# Patient Record
Sex: Male | Born: 1940 | Race: White | Hispanic: No | Smoking: Former smoker
Health system: Southern US, Community
[De-identification: ages and names within clinical notes are randomized; demographics above are authoritative.]

## PROBLEM LIST (undated history)

## (undated) ENCOUNTER — Emergency Department (HOSPITAL_COMMUNITY): Admission: EM | Payer: Self-pay | Source: Home / Self Care

## (undated) DIAGNOSIS — I10 Essential (primary) hypertension: Secondary | ICD-10-CM

## (undated) DIAGNOSIS — M1990 Unspecified osteoarthritis, unspecified site: Secondary | ICD-10-CM

## (undated) DIAGNOSIS — E7800 Pure hypercholesterolemia, unspecified: Secondary | ICD-10-CM

## (undated) DIAGNOSIS — K219 Gastro-esophageal reflux disease without esophagitis: Secondary | ICD-10-CM

## (undated) DIAGNOSIS — Z950 Presence of cardiac pacemaker: Secondary | ICD-10-CM

## (undated) DIAGNOSIS — E119 Type 2 diabetes mellitus without complications: Secondary | ICD-10-CM

## (undated) DIAGNOSIS — W19XXXA Unspecified fall, initial encounter: Secondary | ICD-10-CM

## (undated) DIAGNOSIS — Y92009 Unspecified place in unspecified non-institutional (private) residence as the place of occurrence of the external cause: Secondary | ICD-10-CM

## (undated) DIAGNOSIS — I480 Paroxysmal atrial fibrillation: Secondary | ICD-10-CM

## (undated) HISTORY — PX: FINGER AMPUTATION: SHX636

## (undated) HISTORY — PX: HEMORRHOID BANDING: SHX5850

---
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
## 2004-04-18 ENCOUNTER — Ambulatory Visit: Payer: Self-pay | Admitting: Internal Medicine

## 2004-09-19 ENCOUNTER — Ambulatory Visit: Payer: Self-pay | Admitting: Internal Medicine

## 2004-10-23 ENCOUNTER — Emergency Department (HOSPITAL_COMMUNITY): Admission: EM | Admit: 2004-10-23 | Discharge: 2004-10-23 | Payer: Self-pay | Admitting: Emergency Medicine

## 2004-11-11 ENCOUNTER — Ambulatory Visit: Payer: Self-pay | Admitting: Internal Medicine

## 2005-01-31 ENCOUNTER — Ambulatory Visit: Payer: Self-pay | Admitting: Internal Medicine

## 2005-03-07 ENCOUNTER — Ambulatory Visit: Payer: Self-pay | Admitting: Internal Medicine

## 2005-06-16 ENCOUNTER — Ambulatory Visit: Payer: Self-pay | Admitting: Internal Medicine

## 2006-06-15 ENCOUNTER — Ambulatory Visit: Payer: Self-pay | Admitting: Internal Medicine

## 2006-06-15 LAB — CONVERTED CEMR LAB
BUN: 8 mg/dL (ref 6–23)
Calcium: 9.2 mg/dL (ref 8.4–10.5)
Chloride: 103 meq/L (ref 96–112)
Creatinine, Ser: 1 mg/dL (ref 0.4–1.5)
Creatinine,U: 92.4 mg/dL
Microalb Creat Ratio: 6.5 mg/g (ref 0.0–30.0)
Microalb, Ur: 0.6 mg/dL (ref 0.0–1.9)
Potassium: 4.1 meq/L (ref 3.5–5.1)
Total CHOL/HDL Ratio: 5.7
VLDL: 36 mg/dL (ref 0–40)

## 2006-08-24 ENCOUNTER — Ambulatory Visit: Payer: Self-pay | Admitting: Internal Medicine

## 2007-02-10 ENCOUNTER — Telehealth: Payer: Self-pay | Admitting: Internal Medicine

## 2007-03-05 ENCOUNTER — Encounter: Payer: Self-pay | Admitting: Internal Medicine

## 2007-03-05 DIAGNOSIS — I10 Essential (primary) hypertension: Secondary | ICD-10-CM

## 2007-03-05 DIAGNOSIS — Z8669 Personal history of other diseases of the nervous system and sense organs: Secondary | ICD-10-CM

## 2007-03-05 DIAGNOSIS — E119 Type 2 diabetes mellitus without complications: Secondary | ICD-10-CM | POA: Insufficient documentation

## 2007-03-05 DIAGNOSIS — K219 Gastro-esophageal reflux disease without esophagitis: Secondary | ICD-10-CM | POA: Insufficient documentation

## 2007-03-05 DIAGNOSIS — R569 Unspecified convulsions: Secondary | ICD-10-CM

## 2007-03-05 DIAGNOSIS — M549 Dorsalgia, unspecified: Secondary | ICD-10-CM | POA: Insufficient documentation

## 2007-04-19 ENCOUNTER — Encounter: Payer: Self-pay | Admitting: Internal Medicine

## 2007-04-21 ENCOUNTER — Encounter: Payer: Self-pay | Admitting: Internal Medicine

## 2007-05-07 ENCOUNTER — Encounter: Payer: Self-pay | Admitting: Internal Medicine

## 2007-05-12 ENCOUNTER — Encounter: Payer: Self-pay | Admitting: Internal Medicine

## 2007-05-25 ENCOUNTER — Encounter: Payer: Self-pay | Admitting: Internal Medicine

## 2007-05-25 ENCOUNTER — Telehealth: Payer: Self-pay | Admitting: Internal Medicine

## 2007-06-01 ENCOUNTER — Telehealth: Payer: Self-pay | Admitting: Internal Medicine

## 2007-06-04 ENCOUNTER — Encounter: Payer: Self-pay | Admitting: Internal Medicine

## 2007-07-07 ENCOUNTER — Telehealth: Payer: Self-pay | Admitting: Internal Medicine

## 2007-07-27 ENCOUNTER — Encounter: Payer: Self-pay | Admitting: Internal Medicine

## 2007-08-10 ENCOUNTER — Encounter: Payer: Self-pay | Admitting: Internal Medicine

## 2007-09-01 ENCOUNTER — Encounter: Payer: Self-pay | Admitting: Internal Medicine

## 2007-09-02 ENCOUNTER — Telehealth: Payer: Self-pay | Admitting: Internal Medicine

## 2007-09-22 ENCOUNTER — Encounter: Payer: Self-pay | Admitting: Internal Medicine

## 2007-09-23 ENCOUNTER — Encounter: Payer: Self-pay | Admitting: Internal Medicine

## 2007-10-27 ENCOUNTER — Telehealth: Payer: Self-pay | Admitting: Internal Medicine

## 2007-11-03 ENCOUNTER — Telehealth: Payer: Self-pay | Admitting: Internal Medicine

## 2007-11-16 ENCOUNTER — Telehealth: Payer: Self-pay | Admitting: Internal Medicine

## 2008-02-04 ENCOUNTER — Telehealth: Payer: Self-pay | Admitting: Internal Medicine

## 2008-02-25 DIAGNOSIS — I480 Paroxysmal atrial fibrillation: Secondary | ICD-10-CM

## 2008-02-25 HISTORY — PX: OTHER SURGICAL HISTORY: SHX169

## 2008-02-25 HISTORY — DX: Paroxysmal atrial fibrillation: I48.0

## 2008-02-25 HISTORY — PX: APPENDECTOMY: SHX54

## 2008-04-26 ENCOUNTER — Telehealth: Payer: Self-pay | Admitting: Internal Medicine

## 2008-06-05 ENCOUNTER — Encounter: Payer: Self-pay | Admitting: Internal Medicine

## 2008-08-08 ENCOUNTER — Telehealth: Payer: Self-pay | Admitting: Internal Medicine

## 2008-08-09 ENCOUNTER — Encounter (INDEPENDENT_AMBULATORY_CARE_PROVIDER_SITE_OTHER): Payer: Self-pay | Admitting: *Deleted

## 2008-12-13 ENCOUNTER — Telehealth (INDEPENDENT_AMBULATORY_CARE_PROVIDER_SITE_OTHER): Payer: Self-pay | Admitting: *Deleted

## 2008-12-16 ENCOUNTER — Ambulatory Visit: Payer: Self-pay | Admitting: Internal Medicine

## 2008-12-16 ENCOUNTER — Inpatient Hospital Stay (HOSPITAL_COMMUNITY): Admission: EM | Admit: 2008-12-16 | Discharge: 2008-12-20 | Payer: Self-pay | Admitting: Emergency Medicine

## 2008-12-18 ENCOUNTER — Encounter (INDEPENDENT_AMBULATORY_CARE_PROVIDER_SITE_OTHER): Payer: Self-pay | Admitting: General Surgery

## 2008-12-27 ENCOUNTER — Ambulatory Visit (HOSPITAL_COMMUNITY): Admission: RE | Admit: 2008-12-27 | Discharge: 2008-12-27 | Payer: Self-pay | Admitting: General Surgery

## 2010-02-05 ENCOUNTER — Encounter
Admission: RE | Admit: 2010-02-05 | Discharge: 2010-03-26 | Payer: Self-pay | Source: Home / Self Care | Attending: Physical Medicine & Rehabilitation | Admitting: Physical Medicine & Rehabilitation

## 2010-02-11 ENCOUNTER — Ambulatory Visit: Payer: Self-pay | Admitting: Physical Medicine & Rehabilitation

## 2010-05-30 LAB — CBC
HCT: 37.7 % — ABNORMAL LOW (ref 39.0–52.0)
Hemoglobin: 14.8 g/dL (ref 13.0–17.0)
MCHC: 34 g/dL (ref 30.0–36.0)
MCHC: 34.5 g/dL (ref 30.0–36.0)
MCV: 92.7 fL (ref 78.0–100.0)
Platelets: 257 10*3/uL (ref 150–400)
Platelets: 289 10*3/uL (ref 150–400)
Platelets: 330 10*3/uL (ref 150–400)
RBC: 4.67 MIL/uL (ref 4.22–5.81)
RDW: 14.2 % (ref 11.5–15.5)
RDW: 14.3 % (ref 11.5–15.5)
WBC: 21.4 10*3/uL — ABNORMAL HIGH (ref 4.0–10.5)

## 2010-05-30 LAB — URINALYSIS, ROUTINE W REFLEX MICROSCOPIC
Glucose, UA: 500 mg/dL — AB
Hgb urine dipstick: NEGATIVE
Nitrite: NEGATIVE
Protein, ur: 30 mg/dL — AB
Urobilinogen, UA: 1 mg/dL (ref 0.0–1.0)
pH: 5 (ref 5.0–8.0)

## 2010-05-30 LAB — GLUCOSE, CAPILLARY
Glucose-Capillary: 104 mg/dL — ABNORMAL HIGH (ref 70–99)
Glucose-Capillary: 107 mg/dL — ABNORMAL HIGH (ref 70–99)
Glucose-Capillary: 110 mg/dL — ABNORMAL HIGH (ref 70–99)
Glucose-Capillary: 128 mg/dL — ABNORMAL HIGH (ref 70–99)
Glucose-Capillary: 130 mg/dL — ABNORMAL HIGH (ref 70–99)
Glucose-Capillary: 133 mg/dL — ABNORMAL HIGH (ref 70–99)
Glucose-Capillary: 137 mg/dL — ABNORMAL HIGH (ref 70–99)
Glucose-Capillary: 144 mg/dL — ABNORMAL HIGH (ref 70–99)
Glucose-Capillary: 155 mg/dL — ABNORMAL HIGH (ref 70–99)
Glucose-Capillary: 160 mg/dL — ABNORMAL HIGH (ref 70–99)
Glucose-Capillary: 99 mg/dL (ref 70–99)

## 2010-05-30 LAB — BASIC METABOLIC PANEL
BUN: 10 mg/dL (ref 6–23)
BUN: 17 mg/dL (ref 6–23)
BUN: 28 mg/dL — ABNORMAL HIGH (ref 6–23)
BUN: 36 mg/dL — ABNORMAL HIGH (ref 6–23)
CO2: 24 mEq/L (ref 19–32)
CO2: 28 mEq/L (ref 19–32)
Calcium: 7.5 mg/dL — ABNORMAL LOW (ref 8.4–10.5)
Calcium: 7.8 mg/dL — ABNORMAL LOW (ref 8.4–10.5)
Chloride: 101 mEq/L (ref 96–112)
Chloride: 102 mEq/L (ref 96–112)
GFR calc non Af Amer: 37 mL/min — ABNORMAL LOW (ref >60)
GFR calc non Af Amer: 39 mL/min — ABNORMAL LOW (ref >60)
GFR calc non Af Amer: 58 mL/min — ABNORMAL LOW (ref >60)
GFR calc non Af Amer: >60 mL/min (ref >60)
Glucose, Bld: 106 mg/dL — ABNORMAL HIGH (ref 70–99)
Glucose, Bld: 112 mg/dL — ABNORMAL HIGH (ref 70–99)
Glucose, Bld: 153 mg/dL — ABNORMAL HIGH (ref 70–99)
Potassium: 3.9 mEq/L (ref 3.5–5.1)
Potassium: 5.4 mEq/L — ABNORMAL HIGH (ref 3.5–5.1)

## 2010-05-30 LAB — COMPREHENSIVE METABOLIC PANEL
AST: 22 U/L (ref 0–37)
Albumin: 2.7 g/dL — ABNORMAL LOW (ref 3.5–5.2)
Alkaline Phosphatase: 81 U/L (ref 39–117)
Calcium: 8.5 mg/dL (ref 8.4–10.5)
Chloride: 98 mEq/L (ref 96–112)
GFR calc Af Amer: 40 mL/min — ABNORMAL LOW (ref >60)
GFR calc non Af Amer: 33 mL/min — ABNORMAL LOW (ref >60)
Glucose, Bld: 227 mg/dL — ABNORMAL HIGH (ref 70–99)
Total Bilirubin: 0.9 mg/dL (ref 0.3–1.2)

## 2010-05-30 LAB — DIFFERENTIAL
Basophils Absolute: 0 10*3/uL (ref 0.0–0.1)
Basophils Relative: 0 % (ref 0–1)
Eosinophils Absolute: 0 10*3/uL (ref 0.0–0.7)
Eosinophils Relative: 0 % (ref 0–5)
Eosinophils Relative: 0 % (ref 0–5)
Lymphocytes Relative: 2 % — ABNORMAL LOW (ref 12–46)
Lymphocytes Relative: 6 % — ABNORMAL LOW (ref 12–46)
Monocytes Absolute: 0.6 10*3/uL (ref 0.1–1.0)
Neutrophils Relative %: 91 % — ABNORMAL HIGH (ref 43–77)

## 2010-05-30 LAB — HEMOGLOBIN A1C: Mean Plasma Glucose: 166 mg/dL

## 2010-05-30 LAB — PROTIME-INR: INR: 1.07 (ref 0.00–1.49)

## 2010-05-30 LAB — CULTURE, ROUTINE-ABSCESS

## 2010-05-30 LAB — CARDIAC PANEL(CRET KIN+CKTOT+MB+TROPI): Troponin I: 0.03 ng/mL (ref 0.00–0.06)

## 2010-05-30 LAB — URINE MICROSCOPIC-ADD ON

## 2010-07-09 NOTE — Assessment & Plan Note (Signed)
Darin Gonzalez                           PRIMARY CARE OFFICE NOTE   Darin Gonzalez                       MRN:          [id number]  DATE:08/24/2006                            DOB:          1940-05-06    Darin Gonzalez is a 70 year old Caucasian gentleman who is followed for  diabetes and hypertension who presents today for followup evaluation and  exam.  Patient has, over the past several months, requested several  prescriptions for Vicodin.  Most recently saw Dr. Efrain Gonzalez June 15, 2006, who at that time provided a prescription for Vicodin ES number 120  to take 4 times daily.  In talking with the patient, he reports the  Vicodin is to help treat the back and knee pain.  The prescription  evidently was initiated by Dr. Cindee Gonzalez.  I have contacted Dr. Wilmer Gonzalez  office, and they had no x-ray studies on this gentleman, who has  prescribed Vicodin for him in March.   Patient has been followed for diabetes.  We have had a flurry of  exchanges with multiple diabetic supply firms with the patient  requesting supplies for monitoring t.i.d.  He has been well controlled,  and this has been declined.   PAST MEDICAL HISTORY:  Surgical:  1. Tonsillectomy, remote.  2. Vasectomy, remote.  3. 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   Medical illnesses:  1. Usual childhood diseases.  2. Seizure activity as a younger man, now resolved.  3. Hypertension.  4. GERD.  5. Diabetes.  6. Back and knee pain.   HABITS:  Tobacco none.  Alcohol none.   DRUG ALLERGIES:  PATIENT REPORTS CODEINE CAUSES HIM TO BE CRAZY, YET HE  IS TOLERATING HYDRO CONE.  SULFA CAUSES HIM TO BE CRAZY.   FAMILY HISTORY:  Both parents died of MI's.  One brother with CAD.   SOCIAL HISTORY:  Patient is in his 5th marriage with his 4th wife.  He  has 1 son and 1 daughter, 3 grandchildren.  Currently unemployed.   REVIEW OF SYSTEMS:  Negative for any  fever, sweats, or chills.  He has  had no visual changes, and has an eye exam scheduled for August 31, 2006.  No cardiovascular, respiratory, GI, or GU problems.   CURRENT MEDICATIONS:  1. Lotrel 5/20 once daily.  2. Metformin 1000 mg b.i.d.  3. Protonix 40 mg q.a.m.  4. Nasacort nasal spray daily.  5. Sertraline 50 mg daily.  6. Vicodin 7.5/750 q.i.d.  7. Viagra on a p.r.n. basis.   EXAMINATION:  Temperature was 98.6.  Blood pressure 137/80.  Pulse 91.  Weight 277.  GENERAL APPEARANCE:  This is an overweight Caucasian male in no acute  distress.  HEENT EXAM:  Normocephalic and atraumatic.  Conjunctivae and sclerae  were clear.  NECK:  Supple without thyromegaly.  NODES:  No adenopathy was noted in the cervical, supraclavicular  regions.  CHEST:  No CVA tenderness or deformities noted.  LUNGS:  Clear with no rales, wheezes, or rhonchi.  CARDIOVASCULAR:  Two plus radial  pulses.  No JVD.  No carotid bruits.  He had a quiet precordium with a regular rate and rhythm without  murmurs, rubs, or gallops.  ABDOMEN:  Obese.  No guarding or rebound was noted.  No  organosplenomegaly was noted.  EXTREMITIES:  Patient has no deformities noted.  No effusions of the  knee.  MUSCULOSKELETAL EXAM:  Patient is able to move about the exam room  without difficulty.  He is able to stand without assistance.  He can do  a step-up without assistance.  DERM:  Clear.   ASSESSMENT AND PLAN:  1. Hypertension.  Patient's blood pressure is adequately controlled.      He will continue on his present medications.  2. Diabetes.  Patient's last laboratory work dates from June 15, 2006, at which time he had a serum glucose of 146, and an A1c of      6.6%.  Plan:  Patient is to continue on his present medical      regimen.  3. Gastroesophageal reflux disease.  Patient is stable on his present      medicine, and will continue with same.  4. Lipids.  Patient's cholesterol is 213 with an HDL of 37.5, LDL  of      161.  Patient is diabetic, and the goal would be for him to have an      LDL cholesterol of less than 100.  Plan:  Lovastatin 20 mg 2 q.a.m.      with repeat laboratory in 4 to 6 weeks to monitor his response to      medication.  5. Chronic pain.  Patient reports he has ongoing back pain and knee      pain.  There is no documentation of the degree of his      osteoarthritis.  Plan:  Patient is to have bilateral knee films and      LS spine films, and he is to return for those at his convenience.  6. Pain management.  Patient evidently with chronic pain, which he      reports he has adequate control with Vicodin 7.5/750 four times      daily.  Discussed with him pain management, and the goals of pain      management including the concept of control and not being pain      free.  Went over the risks and benefits of pain management with      narcotics including somnolence, higher risk for accidents, risks      for constipation.  Patient also understands the potential for      addiction with hydrocodone.  The rules of prescribing were      discussed, including the use of a single prescribe for all pain      related medications, use of single pharmacy.  Patient is not to      call after hours for refills, not to call on weekends for refills,      or seek refills from other members of my group.  He understands      that violating any of these precepts would result in cessation of      narcotic prescribing.  Plan:  Patient is given refill prescriptions      for Vicodin 7.5/750 for the next 3 months.  He is to contact his      pharmacy supplier to cancel out his standing prescription on file      for September 25, 2006.  Patient is started on nabumetone 1000 mg      nightly.  Patient is continue these medications.  He is to follow      up on a routine basis.  He is subject to random drug screens. 7. Health maintenance.  Patient's laboratory work is unremarkable      except for the lipids as  noted above.  He would be a candidate for      colorectal cancer screening, which can be arranged at his      convenience.  Last chest x-ray was from Jun 29, 2003, and showed no      active disease, and no indication for repeat at this time.     Rosalyn Gess Norins, MD  Electronically Signed    MEN/MedQ  DD: 08/25/2006  DT: 08/25/2006  Job #: [id number]   cc:   Darleene Cleaver

## 2011-04-10 ENCOUNTER — Telehealth: Payer: Self-pay

## 2011-04-10 NOTE — Telephone Encounter (Signed)
[employer] CALLING TO CONFIRM WE RECEIVED FORM FOR DIABETIC TESTING SUPPLIES. SUPPLIES DENIED BY INSURANCE AND THEY NEED FORM FILLED OUT TO [employer].  BEST: [phone]

## 2011-04-10 NOTE — Telephone Encounter (Signed)
.  [facility] PT STATES THE MEDICINE HE TAKES FOR DEPRESSION HAVE STOPPED WORKING. NEED TO HAVE SOMETHING ELSE PLEASE CALL [phone]   [facility] ON [address]

## 2011-04-11 NOTE — Telephone Encounter (Signed)
Pt last seen August 2012.  He will need to RTC for medication change or adjustment.

## 2011-04-11 NOTE — Telephone Encounter (Signed)
PT SHOWED UP AT [facility] EXPECTING HIS MEDS TO BE READY PT INFORMED THROUGH PHARMACY WE NEED TO GET IT APPROVED BY A PROVIDER  DEPRESSION MEDICATION

## 2011-04-11 NOTE — Telephone Encounter (Signed)
Spoke with [employer], they state they believe they have received fax but will give [place] a CB if they did not. Gave our fax numbers if they needed to refax forms.

## 2011-04-11 NOTE — Telephone Encounter (Signed)
Spoke with patient says klonazepam 2mg  is not helping him sleep at all. Pt states has taken 5mg  of this medication before and says it helped him out? Patient's chart at desk.

## 2011-04-11 NOTE — Telephone Encounter (Signed)
Need chart

## 2011-04-12 NOTE — Telephone Encounter (Signed)
Spoke with pat and advised him to RTC for eval to adjust meds. Pt understood and will come in to see Dr L.

## 2011-04-24 ENCOUNTER — Ambulatory Visit: Payer: Self-pay | Admitting: Family Medicine

## 2011-05-02 ENCOUNTER — Telehealth: Payer: Self-pay

## 2011-05-02 ENCOUNTER — Ambulatory Visit (INDEPENDENT_AMBULATORY_CARE_PROVIDER_SITE_OTHER): Payer: Medicare Other | Admitting: Family Medicine

## 2011-05-02 VITALS — BP 123/71 | HR 90 | Temp 97.5°F | Resp 24 | Ht 66.0 in | Wt 267.2 lb

## 2011-05-02 DIAGNOSIS — I10 Essential (primary) hypertension: Secondary | ICD-10-CM | POA: Diagnosis not present

## 2011-05-02 MED ORDER — ESOMEPRAZOLE MAGNESIUM 40 MG PO CPDR: 40.0000 mg | DELAYED_RELEASE_CAPSULE | Freq: Every day | ORAL | Status: DC

## 2011-05-02 MED ORDER — PRAVASTATIN SODIUM 40 MG PO TABS: 40.0000 mg | ORAL_TABLET | Freq: Every day | ORAL | Status: DC

## 2011-05-02 MED ORDER — LORAZEPAM 1 MG PO TABS: 2.0000 mg | ORAL_TABLET | Freq: Two times a day (BID) | ORAL | Status: DC | PRN

## 2011-05-02 MED ORDER — ALBUTEROL SULFATE HFA 108 (90 BASE) MCG/ACT IN AERS: 2.0000 | INHALATION_SPRAY | Freq: Four times a day (QID) | RESPIRATORY_TRACT | Status: DC | PRN

## 2011-05-02 MED ORDER — POLYETHYLENE GLYCOL 3350 17 GM/SCOOP PO POWD: 17.0000 g | Freq: Two times a day (BID) | ORAL | Status: AC | PRN

## 2011-05-02 MED ORDER — AMLODIPINE BESY-BENAZEPRIL HCL 5-20 MG PO CAPS: 1.0000 | ORAL_CAPSULE | Freq: Every day | ORAL | Status: DC

## 2011-05-02 MED ORDER — METFORMIN HCL 1000 MG PO TABS: ORAL_TABLET | ORAL | Status: DC

## 2011-05-02 NOTE — Progress Notes (Signed)
71 yo man seen with son today.  He has been doing well, losing weight and generally feeling well except for some constipation, which he attributes to Pravastatin.  O:  NAD, labs and hx reviewed with patient.  Prescriptions rewritten.  P:  Switch from clonazepam to lorazepam, add miralax.  Otherwise no changes. Recheck full labs in 6 months.

## 2011-05-05 ENCOUNTER — Telehealth: Payer: Self-pay

## 2011-05-05 NOTE — Telephone Encounter (Signed)
.  [facility] PT STATES THE PHARMACY HE USUALLY GETS HIS MEDICINE FROM ISN'T ABLE TO GET HIM ALL THAT HE NEEDS WOULD LIKE [place] TO CALL IN 5 MORE REFILLS ON HIS LORAZEPAM, ALREADY HAVE ONE REFILL   [facility] ON [address]

## 2011-05-06 ENCOUNTER — Telehealth: Payer: Self-pay

## 2011-05-06 NOTE — Telephone Encounter (Signed)
Patient had 180 with 1 rf sent into [facility] on 3/8...  How many was he able to get?  When can they send remainder?  Get info and then we can ask Dr. Elbert Ewings...  LMOM to CB.

## 2011-05-06 NOTE — Telephone Encounter (Signed)
Pt stated that mail order pharmacy does not do controlled meds anymore and needed rx for lorazepam called into local pharmacy.  rx phoned in to local pharmacy

## 2011-05-06 NOTE — Telephone Encounter (Signed)
Pt called back advised RX sent to his pharmacy

## 2011-05-08 ENCOUNTER — Ambulatory Visit: Payer: Self-pay | Admitting: Family Medicine

## 2011-06-03 ENCOUNTER — Telehealth: Payer: Self-pay

## 2011-06-05 NOTE — Telephone Encounter (Signed)
Chart not filed back, was told Dr. Elbert Ewings has chart at appt center.  Dr. Marylu Lund you rx, or let us know the type of meter patient uses so we can rx?  Thank you!

## 2011-06-05 NOTE — Telephone Encounter (Signed)
PT STATES HE WAS SUPPOSE TO BE GETTING SOME TEST SCRIPTS AND HE TEST 4 TIMES A DAY PLEASE CALL [phone]      [facility] ON [address]

## 2011-06-05 NOTE — Telephone Encounter (Signed)
Spoke w/pt and explained that Dr L said it was not [place] for him to test more than 1 x day and therefore, he cannot Rx for 3 x day. Pt verb understanding and said that [employer] had told him this morn that they will send him out the supplies for 1 x day. He will CB if he has any trouble getting these.

## 2011-06-06 ENCOUNTER — Telehealth: Payer: Self-pay

## 2011-06-06 NOTE — Telephone Encounter (Signed)
Pt states he is out of his test strips and he will definitely need them tomorrow

## 2011-06-06 NOTE — Telephone Encounter (Signed)
Pt calling and would like Dr L to call [employer] [facility] to explain to them that pt test 3x a day, because they need to know which qaunity to send he normally gets 300 strips 3x a day.Rosann Auerbach rx customer serv [phone] pharmacy help desk [phone] pt Id #[id number]

## 2011-06-09 NOTE — Telephone Encounter (Signed)
Please call [employer] customer service for this.

## 2011-06-09 NOTE — Telephone Encounter (Signed)
Spoke w/[employer] and found out a new form must be completed for Medicare to cover the QD testing. He will send a new form to be completed and faxed in.

## 2011-06-13 NOTE — Telephone Encounter (Signed)
Gardiner Rhyme from [employer] they are needing the code for pt supply they faxed info on the 12th this is or still should be in Dr Armond Hang box, please fax info if any questions contact customer service [phone]

## 2011-06-19 ENCOUNTER — Ambulatory Visit: Payer: Medicare Other | Admitting: Family Medicine

## 2011-06-19 VITALS — BP 95/63 | HR 112 | Temp 98.2°F | Resp 18 | Ht 66.0 in | Wt 261.4 lb

## 2011-06-19 DIAGNOSIS — L2989 Other pruritus: Secondary | ICD-10-CM

## 2011-06-19 DIAGNOSIS — L298 Other pruritus: Secondary | ICD-10-CM

## 2011-06-19 DIAGNOSIS — L239 Allergic contact dermatitis, unspecified cause: Secondary | ICD-10-CM

## 2011-06-19 DIAGNOSIS — IMO0001 Reserved for inherently not codable concepts without codable children: Secondary | ICD-10-CM

## 2011-06-19 MED ORDER — TRIAMCINOLONE ACETONIDE 0.1 % EX CREA: TOPICAL_CREAM | Freq: Two times a day (BID) | CUTANEOUS | Status: DC

## 2011-06-19 NOTE — Patient Instructions (Signed)

## 2011-06-19 NOTE — Progress Notes (Signed)
Subjective: 71 year old man whose grandson stayed with him  last Friday. His grandson was itching quite a lot. After he left the patient noticed that he started itching, and developed a rash on his low back medial thighs and groin region. He knows of no contact with anything that he would've been allergic to. No new close ourselves.  Objective: Erythematous maculopapular rash in the small of the back, a little in the groin, and upper medial thigh regions.  KOH skin scraping was done and was negative.  Assessment: Neuritis Allergic dermatitis Diabetes, not well controlled  Plan: Triamcinolone cream twice a day. If he is not doing better he is to get back to [place]. Thank you

## 2011-06-20 NOTE — Telephone Encounter (Signed)
Can you please advise on this.  How many times a day would you like for him to test?

## 2011-06-20 NOTE — Telephone Encounter (Signed)
Drs.,  Please advise the clinical message pool how many times a day this patient was instructed to check his blood sugars.  His most recent prescription for strips only gave him #100, instead of #300.

## 2011-06-20 NOTE — Telephone Encounter (Signed)
PT STATES HE ONLY RECEIVED 2 BOXES OF HIS SCRIPTS AND HE USUALLY GET 6 BOXES AND DOESN'T UNDERSTAND WHY. HE NEED TO TEST MORE SINCE HIS SUGAR IS HIGH PLEASE CALL [phone]

## 2011-06-21 ENCOUNTER — Other Ambulatory Visit: Payer: Self-pay | Admitting: Family Medicine

## 2011-06-22 ENCOUNTER — Other Ambulatory Visit: Payer: Self-pay | Admitting: Family Medicine

## 2011-06-22 NOTE — Telephone Encounter (Signed)
Patient only needs to check his blood sugar once a day, varying times of day when test is done: Fasting three times a week, 2 hours pc twice a week, and evenings twice a week.

## 2011-06-23 NOTE — Telephone Encounter (Signed)
LMOM to call back

## 2011-06-23 NOTE — Telephone Encounter (Signed)
Spoke with patient and let him know what dr. l said to do.  Patient stated that he understood.

## 2011-06-26 ENCOUNTER — Telehealth: Payer: Self-pay

## 2011-06-26 NOTE — Telephone Encounter (Signed)
.  [facility] Darin Gonzalez from [employer] called to request progress notes for Medicare review.  Darin Gonzalez stated that she sent request on 06/04/11 but has not had response.  Please call Darin Gonzalez at [phone]. Fax number to send records to is [phone].

## 2011-06-27 ENCOUNTER — Telehealth: Payer: Self-pay

## 2011-06-27 NOTE — Telephone Encounter (Signed)
Please fax all recent (last 6 months) progress notes, labs to [place] at [phone] for [facility] certification.

## 2011-06-27 NOTE — Telephone Encounter (Signed)
PT STATES THE TUBE OF MEDICINE HE WAS GIVEN FOR ITCHING HAVE ALL GONE AND IT DIDN'T DO ANYTHING ANYWAY. DIDN'T HELP AT ALL PLEASE CALL PT AT [phone]

## 2011-06-29 MED ORDER — HYDROXYZINE HCL 25 MG PO TABS: 25.0000 mg | ORAL_TABLET | Freq: Every evening | ORAL | Status: AC | PRN

## 2011-06-29 NOTE — Telephone Encounter (Signed)
Please advise 

## 2011-06-29 NOTE — Telephone Encounter (Signed)
Called patient. Left message on machine with instructions.

## 2011-06-29 NOTE — Telephone Encounter (Signed)
Take OTC Claritin QAM, Atarax (RX sent) at HS, both as needed.  If symptoms persist, RTC.

## 2011-07-28 ENCOUNTER — Other Ambulatory Visit: Payer: Self-pay | Admitting: Family Medicine

## 2011-07-31 ENCOUNTER — Telehealth: Payer: Self-pay

## 2011-07-31 NOTE — Telephone Encounter (Signed)
Please pull paper chart.  

## 2011-07-31 NOTE — Telephone Encounter (Signed)
Chart pulled to PA 

## 2011-07-31 NOTE — Telephone Encounter (Signed)
.  [facility] The patient called to request refill of Lorazepam.  The patient stated the pharmacy has sent over multiple faxes regarding this Rx.  The patient uses [facility] on [address].  The patient may be reached at [phone]. Just an FYI- this Rx is not on patient's medication list in Epic.

## 2011-08-01 ENCOUNTER — Telehealth: Payer: Self-pay

## 2011-08-01 MED ORDER — LORAZEPAM 2 MG PO TABS: 2.0000 mg | ORAL_TABLET | Freq: Four times a day (QID) | ORAL | Status: AC | PRN

## 2011-08-01 NOTE — Telephone Encounter (Signed)
Patient is no longer taking clonazepam--states it did not work for him.  Would like Lorazepam 2mg  to take bid.  Notified we would rx med.  Also, see other msg-- patient is still very itchy all over.  Is out of cream rx'd and benadryl does not help.  Will try OTC Zyrtec or Claritin, but would like rx for something else to help with rash.  pls advise.

## 2011-08-01 NOTE — Telephone Encounter (Signed)
If zyrtec or claritin do not work, RTC.  Lorazepam Rx ready to be faxed

## 2011-08-01 NOTE — Telephone Encounter (Signed)
Was he on lorazepam or clonazepam most recently?  Which dose and how has he been taking it?  We can send him in some; I just need clarification.  I don't see any prescriptions for lorazepam.

## 2011-08-01 NOTE — Telephone Encounter (Signed)
Pt states he has a rash all over his body - the cream does not work The pills don't work, he tried benadryl doesn't work either. Would like some thing called in to [facility] n [address]

## 2011-08-02 NOTE — Telephone Encounter (Signed)
Spoke with patient-- plans to try Claritin today and RTC if no relief.  Notified Lorazepam was faxed.

## 2011-08-03 ENCOUNTER — Encounter: Payer: Self-pay | Admitting: Family Medicine

## 2011-08-03 ENCOUNTER — Ambulatory Visit (INDEPENDENT_AMBULATORY_CARE_PROVIDER_SITE_OTHER): Payer: Medicare Other | Admitting: Family Medicine

## 2011-08-03 VITALS — BP 116/59 | HR 99 | Temp 97.9°F | Resp 18 | Wt 256.0 lb

## 2011-08-03 DIAGNOSIS — L309 Dermatitis, unspecified: Secondary | ICD-10-CM

## 2011-08-03 DIAGNOSIS — IMO0001 Reserved for inherently not codable concepts without codable children: Secondary | ICD-10-CM

## 2011-08-03 DIAGNOSIS — L259 Unspecified contact dermatitis, unspecified cause: Secondary | ICD-10-CM | POA: Diagnosis not present

## 2011-08-03 DIAGNOSIS — G479 Sleep disorder, unspecified: Secondary | ICD-10-CM

## 2011-08-03 MED ORDER — CLOBETASOL PROP EMOLLIENT BASE 0.05 % EX CREA: TOPICAL_CREAM | CUTANEOUS | Status: DC

## 2011-08-03 MED ORDER — HYDROXYZINE PAMOATE 25 MG PO CAPS: ORAL_CAPSULE | ORAL | Status: DC

## 2011-08-03 NOTE — Progress Notes (Signed)
Subjective Patient is back for the rash that S. arm for 6 weeks ago. Skeleton worse. He has it on his body from his waist his neck and some down his thighs. It is especially bad on his left shoulder and posterior upper arm. Also has it more across the chest and right upper arm. It seems to badly for her to sleep last night. He's tried various OTC medications along with the triamcinolone cream, and nothing has really helped.  Objective: Patient is a diabetic his sugars run around 220 Splotchy eczematoid-appearing excoriated rash in the areas as described above. It is very extensive. It is confluent on his left upper back and shoulder.  Area was numbed up with 1% lidocaine with epinephrine 3 mm punch biopsy was used to obtain a specimen. This was sent for dermatologic pathologic evaluation.  Assessment: Eczema Diabetes Sleep disturbance  Plan : Clobetasol cream. I will have him mix a little and some lotion, such as a keri-lotion. Doxy--gave here since out of money Vistaril Refer to dermatology if not doing better. Cannot give the prednisone because of his diabetes

## 2011-08-03 NOTE — Patient Instructions (Addendum)
Mix a little of the  clobetasol cream with some Alpha-keri or Aveeno lotion and applied to the worse areas of the rash.  Take the doxycycline one tablet twice daily for infection  Take the Vistaril (hydroxyzine) one or 2 pills every 6 or 8 hours as needed for itching. It will make you sleepy. Do not take the Benadryl when you're taking the Vistaril.  If you do not hear from the results of the biopsy in the next 7-10 days contact us. If you're not doing better in the meanwhile contact us and we will work on a dermatologic referral.

## 2011-08-06 ENCOUNTER — Encounter: Payer: Self-pay | Admitting: Family Medicine

## 2011-08-07 ENCOUNTER — Telehealth: Payer: Self-pay

## 2011-08-07 NOTE — Telephone Encounter (Signed)
Patient notified biopsy results and will recheck as directed.

## 2011-08-07 NOTE — Telephone Encounter (Signed)
WANTS TO KNOW IF HIS BIOPSY RESULTS ARE BACK

## 2011-08-11 ENCOUNTER — Telehealth: Payer: Self-pay

## 2011-08-11 ENCOUNTER — Other Ambulatory Visit: Payer: Self-pay

## 2011-08-11 ENCOUNTER — Telehealth: Payer: Self-pay | Admitting: Family Medicine

## 2011-08-11 DIAGNOSIS — L309 Dermatitis, unspecified: Secondary | ICD-10-CM

## 2011-08-11 NOTE — Telephone Encounter (Addendum)
Pt would like to have a refill on the cream for his itching that was prescribed to him during his last office visit with [place], pt would like to also know if we could possibly increase the usage from 2 times a day to four times a day.

## 2011-08-11 NOTE — Telephone Encounter (Signed)
PATIENT SPOKE TO SOMEONE LAST WEEK ABOUT GETTING HIS LORAZOPAN FILLED.  IT HAS NEVER BEEN CALLED IN.  PLEASE CALL

## 2011-08-11 NOTE — Telephone Encounter (Signed)
Pharmacy had rx on profile from 6/7 and unsure why it was put on profile but they filled it.  lmom that rx was being filled at pharmacy

## 2011-08-12 MED ORDER — CLOBETASOL PROP EMOLLIENT BASE 0.05 % EX CREA: TOPICAL_CREAM | CUTANEOUS | Status: DC

## 2011-08-12 NOTE — Telephone Encounter (Signed)
Called home--busy--LMOM notifying patient msg below on cell.

## 2011-08-12 NOTE — Telephone Encounter (Signed)
Can refill medication, but Clobetasol needs to be used BID.

## 2011-08-13 ENCOUNTER — Telehealth: Payer: Self-pay | Admitting: Family Medicine

## 2011-08-13 DIAGNOSIS — L309 Dermatitis, unspecified: Secondary | ICD-10-CM

## 2011-08-13 MED ORDER — CLOBETASOL PROP EMOLLIENT BASE 0.05 % EX CREA: TOPICAL_CREAM | CUTANEOUS | Status: DC

## 2011-08-13 NOTE — Telephone Encounter (Signed)
L.M. that rx was sent in.

## 2011-08-13 NOTE — Telephone Encounter (Signed)
done

## 2011-08-13 NOTE — Telephone Encounter (Signed)
Please prescribe two tubes of cream per patient so that insurance will cover it.

## 2011-08-15 NOTE — Telephone Encounter (Signed)
Pt states none of medication is working and would like to be referred to a dermatalogist the Dr name is Dr Verdis Frederickson.. Pt doesn't have phone and hard to contact him he would like for [place] to make referral and send it to him thru mail so he can make the appt.Marland Kitchen

## 2011-08-15 NOTE — Telephone Encounter (Signed)
Starting referral to Dr Terri Piedra per Dr Frederik Pear OV notes. Dr Alwyn Ren I am forwarding this to you FYI, but you may close encounter when finished bc it looks like we can not reach pt by phone.

## 2011-08-17 NOTE — Telephone Encounter (Signed)
Noted  

## 2011-08-20 ENCOUNTER — Other Ambulatory Visit: Payer: Self-pay | Admitting: Family Medicine

## 2011-08-20 ENCOUNTER — Telehealth: Payer: Self-pay

## 2011-08-20 NOTE — Telephone Encounter (Signed)
Patient was referred to [facility] to see Dr. Harriette Ohara on July 17 but his skin rash is getting worse and he is taking more of his medication and is running out. Says insurance wont cover more meds. Tried to get his appt moved up sooner but was told that our office needed to call to get the appt moved up sooner. Can we please call [facility] to change patient appt? Please call patient when appt is changed at [phone].

## 2011-08-22 DIAGNOSIS — L259 Unspecified contact dermatitis, unspecified cause: Secondary | ICD-10-CM | POA: Diagnosis not present

## 2011-08-27 ENCOUNTER — Telehealth: Payer: Self-pay

## 2011-08-27 DIAGNOSIS — R21 Rash and other nonspecific skin eruption: Secondary | ICD-10-CM

## 2011-08-27 NOTE — Telephone Encounter (Signed)
I called patient to advise and he will see his eye doctor, he states he needs new glasses. He asked if we can renew his Lorazepam 2 mg, states Dr Milus Glazier writes this for him and it should be sent to [facility] [address], I told him I will send a request back for him.

## 2011-08-27 NOTE — Telephone Encounter (Signed)
PATIENT WANTS TO ASK DR. Milus Glazier ABOUT HIS SCABIES. HE SAYS THAT SOMETIMES WHEN HE IS SITTING DOWN AND LOOKS UNDER HIS GLASSES HIS HANDS LOOK VERY RED. HE SAID HE WILL LOOK DOWN AT THEM AGAIN AND THEY ARE NOT RED. HE WANTS TO KNOW IF HE COULD JUST BE SEEING THINGS OR IF HE JUST NEEDS TO GET NEW GLASSES? HE TOOK THE 4 PILLS ON Tuesday AND HE WILL TAKE THE NEXT 4 PILLS NEXT Tuesday. BEST PHONE [phone] (HOME)   PHARMACY CHOICE IS [facility] ([address])  MBC

## 2011-08-27 NOTE — Telephone Encounter (Signed)
Can you please advise on what to tell this patient?

## 2011-08-27 NOTE — Telephone Encounter (Signed)
It is unlikely the scabies or the treatment causing this, and his glasses shouldn't cause him to see colors differently.  If he notices other visual changes, he should definitely see his eye doctor.

## 2011-08-28 MED ORDER — LORAZEPAM 2 MG PO TABS: 2.0000 mg | ORAL_TABLET | Freq: Four times a day (QID) | ORAL | Status: AC | PRN

## 2011-08-28 NOTE — Telephone Encounter (Signed)
Spoke to pt, faxed rx to pharm.

## 2011-08-28 NOTE — Telephone Encounter (Signed)
lmom  For pt stating rx faxed to pharm.  call if any questions.

## 2011-08-28 NOTE — Telephone Encounter (Signed)
Rx done and ready to be faxed to pharmacy

## 2011-09-02 ENCOUNTER — Telehealth: Payer: Self-pay

## 2011-09-02 NOTE — Telephone Encounter (Signed)
Pt called and states we have been treating him for rash/itching. His back has cleared up, but states now it is on his legs and top of his hands wants to know what to do [facility] [address]

## 2011-09-02 NOTE — Telephone Encounter (Signed)
lmom to cb. 

## 2011-09-02 NOTE — Telephone Encounter (Signed)
Dr. Milus Glazier-  I don't see any documentation regarding a diagnosis of scabies, or any medications we have sent to treat this. I called the pharmacy and they confirmed Ivermectin was prescribed on 7/1 and 7/3 as well as Permethrin cream by Tollie Eth (a dermatology PA). Please provide documentation to support this as well as advise on next steps for the patient. Thank you!

## 2011-09-02 NOTE — Telephone Encounter (Signed)
The patient called to state that his scabies are causing bumps on his stomach,top of hands, and legs.  The patient stated that he took the 4 pills last Tuesday and 4 pills today and would like to know what to do about this infestation.  Please call the patient at [phone].

## 2011-09-03 NOTE — Telephone Encounter (Signed)
Pt CB and reports he is using both creams and they both seem to help some to improve rashes but he just runs out of the clobetasol cream so quickly bc it comes in little tubes. He has some of that to p/up, but requests RF of the big jar 454 grams of Triamcinolone 1% cream. He also states that the Rx that Dr Milus Glazier wrote for him for stromectol 3 mg seems to have helped some also. He took #4 last Tues, and the second dose of #4 yesterday and feels like it might help but thinks he needs to cont it longer. Dr L, what would you like to do?

## 2011-09-03 NOTE — Telephone Encounter (Signed)
0Patient stated that he has seen a dermatologist and they haven't done anything to help him.  He is going to change his medicine.  He will call his dermatologist back and see what they suggest.

## 2011-09-03 NOTE — Telephone Encounter (Signed)
Please get more details. Appearance? Pruritic? Pain? Fever? Has he done anything for the new areas?

## 2011-09-03 NOTE — Telephone Encounter (Signed)
Will refer to dermatology

## 2011-09-09 ENCOUNTER — Telehealth: Payer: Self-pay

## 2011-09-09 ENCOUNTER — Telehealth: Payer: Self-pay | Admitting: Radiology

## 2011-09-09 NOTE — Telephone Encounter (Signed)
For Dr L only per pt:  Would like to get refills on the following, states will be out by end of month: Hydrocodone 7.5/500- Pharmaracy [facility]: Pravastatin 40 mg  Lotril 520mg  Nexium 4mg  Metformin 1000mg 

## 2011-09-09 NOTE — Telephone Encounter (Signed)
Pt is not able to get rid of scabies and is itching terribly and would like for a nurse to contact him. He put on a cream that was prescribed to him and it made him itch more. [phone]

## 2011-09-09 NOTE — Telephone Encounter (Signed)
Should he RTC

## 2011-09-09 NOTE — Telephone Encounter (Signed)
Yes, RTC 

## 2011-09-09 NOTE — Telephone Encounter (Signed)
Pt states he was given pills previously by Dr Milus Glazier and that helped clear up his back, he went to his skin doctor because he still had some bumps on his body.  Skin doctor gave him cream to apply, so he applied cream.  Then he went and got his 2 other refills and states it has been making him itch.  He was unable to sleep last night. I advised pt to return back to clinic for re-evaluation, or he could have allergic reaction to cream.  I also encouraged him to call his skin doctor today to see if possible they could work him in today to take look at him since they prescribed cream.  Pt has appt with skin doctor Friday.  Again, advised to either return here or contact skin doctor.  Then pt stated that the itching is not "that bad." So I further explained that he needed to RTC/ call skin doctor.  Pt understood.

## 2011-09-09 NOTE — Telephone Encounter (Signed)
Given cream for itching by skin doctor.  Applied twice so he had 2 more tubes filled, applied cream and now is breaking out itching really bad, unable to sleep last night.  Back and arms have cleared up. But still has bumps in other places located on back.

## 2011-09-10 NOTE — Telephone Encounter (Signed)
I am at risk for losing my license by continuing to write for narcotics.  I would like patient to see a chronic pain specialist.  Please get someone to order a referral to pain clinic for me while I am out of town.  We cannot continue ordering these medications.

## 2011-09-10 NOTE — Telephone Encounter (Signed)
LMOM on H # and cell # to CB. Get pt's OK to start referral to pain specialist.

## 2011-09-12 ENCOUNTER — Telehealth: Payer: Self-pay

## 2011-09-12 NOTE — Telephone Encounter (Signed)
LMOM to CB. 

## 2011-09-12 NOTE — Telephone Encounter (Addendum)
The patient called to see if the vistaril would cause his blood sugar to become elevated to over 300 mcg/dl.  The patient would like a return call as soon as possible at [phone].

## 2011-09-12 NOTE — Telephone Encounter (Signed)
See prev phone mes for notes on discussion w/pt.

## 2011-09-12 NOTE — Telephone Encounter (Signed)
Increasing his metformin is not going to address his blood sugars that are that high. He needs to come in and have these addressed if they remain elevated, otherwise follow up with dr Milus Glazier as directed

## 2011-09-12 NOTE — Telephone Encounter (Signed)
Advised pt of Ryan's advice that the Vistaril he took should not have inc his BSs and that if BS conts to be high he needs to RTC for eval and insulin shot to bring it down. If readings come back down to his "normal" of over 200, he still needs to come for recheck w/Dr L to discuss change in therapy for better control. Pt agreed.

## 2011-09-12 NOTE — Telephone Encounter (Signed)
Pt called back and he agreed to referral to pain specialist, which I will order for Dr L per his note. Pt reports his BS was very high this AM after eating it was 378 and after his Metformin later in AM went down to 346. Because it was still so high (it usually runs right around/over 200, but has never seen it go this high) pt took the other 1/2 tab of his Metformin a few minutes ago. instr'd pt to drink a lot of water to help bring BS down and he stated he has been, and also continue to record his readings so that Dr L can review them at his return to determine whether to change his medications. Please advise if pt should take higher dose of Metformin until Dr Cain Saupe return. His current Rx is for Metformin 1000, 1/2 Qam and 1/2 Q pm.

## 2011-09-12 NOTE — Telephone Encounter (Signed)
It is not known to cause elevations like that

## 2011-09-17 ENCOUNTER — Other Ambulatory Visit: Payer: Self-pay | Admitting: Family Medicine

## 2011-09-24 ENCOUNTER — Other Ambulatory Visit: Payer: Self-pay | Admitting: Family Medicine

## 2011-09-25 ENCOUNTER — Other Ambulatory Visit: Payer: Self-pay | Admitting: Physician Assistant

## 2011-09-25 MED ORDER — LORAZEPAM 2 MG PO TABS: 2.0000 mg | ORAL_TABLET | Freq: Four times a day (QID) | ORAL | Status: AC | PRN

## 2011-09-29 ENCOUNTER — Other Ambulatory Visit: Payer: Self-pay | Admitting: Physician Assistant

## 2011-09-29 NOTE — Telephone Encounter (Signed)
?  DENY, SHOULD RECEIVE FROM DERM.Marland KitchenMarland Kitchen

## 2011-09-30 ENCOUNTER — Other Ambulatory Visit: Payer: Self-pay

## 2011-09-30 MED ORDER — TRIAMCINOLONE ACETONIDE 0.1 % EX CREA: TOPICAL_CREAM | Freq: Two times a day (BID) | CUTANEOUS | Status: DC

## 2011-10-20 ENCOUNTER — Ambulatory Visit (INDEPENDENT_AMBULATORY_CARE_PROVIDER_SITE_OTHER): Payer: Medicare Other | Admitting: Family Medicine

## 2011-10-20 VITALS — BP 110/60 | HR 97 | Temp 98.4°F | Resp 18 | Ht 66.5 in | Wt 254.0 lb

## 2011-10-20 DIAGNOSIS — F411 Generalized anxiety disorder: Secondary | ICD-10-CM

## 2011-10-20 DIAGNOSIS — M545 Low back pain, unspecified: Secondary | ICD-10-CM

## 2011-10-20 DIAGNOSIS — E119 Type 2 diabetes mellitus without complications: Secondary | ICD-10-CM

## 2011-10-20 DIAGNOSIS — F419 Anxiety disorder, unspecified: Secondary | ICD-10-CM

## 2011-10-20 LAB — POCT GLYCOSYLATED HEMOGLOBIN (HGB A1C): Hemoglobin A1C: 10.9

## 2011-10-20 MED ORDER — LORAZEPAM 2 MG PO TABS: 2.0000 mg | ORAL_TABLET | Freq: Four times a day (QID) | ORAL | Status: DC | PRN

## 2011-10-20 MED ORDER — HYDROCODONE-ACETAMINOPHEN 7.5-500 MG PO TABS: 1.0000 | ORAL_TABLET | Freq: Four times a day (QID) | ORAL | Status: DC | PRN

## 2011-10-20 MED ORDER — SITAGLIPTIN PHOSPHATE 50 MG PO TABS: 50.0000 mg | ORAL_TABLET | Freq: Every day | ORAL | Status: DC

## 2011-10-20 NOTE — Progress Notes (Signed)
SUBJECTIVE: 71 y.o. male for follow up of diabetes. Diabetic Review of Systems - medication compliance: compliant all of the time.  Other symptoms and concerns: blood sugars are in the 200 to 300 range.  No numbness in feet Vision okay:  Sees Dr. Dione Booze yearly; cataract is small in right eye. No sores on feet.  He does have two open sores on medial right calf.  Current Outpatient Prescriptions  Medication Sig Dispense Refill  . albuterol (PROAIR HFA) 108 (90 BASE) MCG/ACT inhaler Inhale 2 puffs into the lungs every 6 (six) hours as needed.  3 Inhaler  4  . Clobetasol Prop Emollient Base (CLOBETASOL PROPIONATE E) 0.05 % emollient cream Use a small amount twice daily as directed  60 g  1  . esomeprazole (NEXIUM) 40 MG capsule Take 1 capsule (40 mg total) by mouth daily before breakfast.  90 capsule  3  . fluticasone (FLONASE) 50 MCG/ACT nasal spray USE 1 SPRAY NASALLY TWICE DAILY  16 g  2  . HYDROcodone-acetaminophen (LORTAB) 7.5-500 MG per tablet Take 1 tablet by mouth every 6 (six) hours as needed.      . hydrOXYzine (VISTARIL) 25 MG capsule Take one or 2 pills every 6- 8 hours as needed for itching.  Will cause drowsyness  40 capsule  1  . LORazepam (ATIVAN) 2 MG tablet Take 2 mg by mouth every 6 (six) hours as needed.      Marland Kitchen LOTREL 5-20 MG per capsule TAKE 1 CAPSULE DAILY  90 capsule  49  . metFORMIN (GLUCOPHAGE) 1000 MG tablet 1/2 tablet twice daily with meals  90 tablet  3  . pravastatin (PRAVACHOL) 40 MG tablet TAKE 1 TABLET AT BEDTIME  90 tablet  0  . triamcinolone cream (KENALOG) 0.1 % Apply topically 2 (two) times daily.  454 g  3    OBJECTIVE: Appearance: alert, well appearing, and in no distress. BP 110/60  Pulse 97  Temp 98.4 F (36.9 C)  Resp 18  Ht 5' 6.5" (1.689 m)  Wt 254 lb (115.214 kg)  BMI 40.38 kg/m2 Right medial calf has two 1 cm open sores which appear to be healing. Chest:  Clear Heart: reg, no murmur  Exam: fundi no background diabetic retinopathy, no  hemorrhages or exudates and no hypertensive retinopathy  ASSESSMENT: Diabetes Mellitus: poorly controlled  PLAN: See orders for this visit as documented in the electronic medical record. Issues reviewed with him: home glucose monitoring emphasized. 1. Low back pain  HYDROcodone-acetaminophen (LORTAB) 7.5-500 MG per tablet, LORazepam (ATIVAN) 2 MG tablet, Ambulatory referral to Pain Clinic  2. Anxiety  LORazepam (ATIVAN) 2 MG tablet  3. Diabetes mellitus  sitaGLIPtin (JANUVIA) 50 MG tablet

## 2011-10-21 ENCOUNTER — Telehealth: Payer: Self-pay

## 2011-10-21 LAB — COMPREHENSIVE METABOLIC PANEL
ALT: 15 U/L (ref 0–53)
AST: 16 U/L (ref 0–37)
Albumin: 4.4 g/dL (ref 3.5–5.2)
Alkaline Phosphatase: 51 U/L (ref 39–117)
BUN: 7 mg/dL (ref 6–23)
CO2: 28 mEq/L (ref 19–32)
Calcium: 9.4 mg/dL (ref 8.4–10.5)
Chloride: 94 mEq/L — ABNORMAL LOW (ref 96–112)
Creat: 1.01 mg/dL (ref 0.50–1.35)
Glucose, Bld: 238 mg/dL — ABNORMAL HIGH (ref 70–99)
Potassium: 4.3 mEq/L (ref 3.5–5.3)
Sodium: 132 mEq/L — ABNORMAL LOW (ref 135–145)
Total Bilirubin: 0.9 mg/dL (ref 0.3–1.2)
Total Protein: 6.7 g/dL (ref 6.0–8.3)

## 2011-10-21 NOTE — Telephone Encounter (Signed)
Pt was seen yesterday, had labs done, would like the results.  Please call [phone]

## 2011-10-21 NOTE — Telephone Encounter (Signed)
I have advised patient Dr Milus Glazier will review his labs, and I can call him, will you review and advise, patient eager to know lab results.

## 2011-10-31 ENCOUNTER — Other Ambulatory Visit: Payer: Self-pay | Admitting: Family Medicine

## 2011-10-31 DIAGNOSIS — M549 Dorsalgia, unspecified: Secondary | ICD-10-CM

## 2011-11-10 ENCOUNTER — Other Ambulatory Visit: Payer: Self-pay | Admitting: *Deleted

## 2011-11-10 MED ORDER — ONETOUCH ULTRA SYSTEM W/DEVICE KIT: 1.0000 | PACK | Freq: Once | Status: DC

## 2011-11-10 MED ORDER — BLOOD GLUCOSE TEST VI STRP: ORAL_STRIP | Status: DC

## 2011-11-10 NOTE — Telephone Encounter (Signed)
Rxs sent in (Dr L had written script and transferred to [facility]) and pt notified done.

## 2011-11-10 NOTE — Telephone Encounter (Signed)
Pt wanted to know if you could write him for a new meter preferably a One Touch Ultra and some test strips.  He states he tests up to about 3 times a day.  I have pended the rx's, you can look at them and ok and sign them and send them to the pharmacy.

## 2011-11-29 DIAGNOSIS — Z23 Encounter for immunization: Secondary | ICD-10-CM | POA: Diagnosis not present

## 2011-12-01 ENCOUNTER — Other Ambulatory Visit: Payer: Self-pay | Admitting: Radiology

## 2011-12-11 ENCOUNTER — Other Ambulatory Visit: Payer: Self-pay | Admitting: Family Medicine

## 2011-12-24 ENCOUNTER — Telehealth: Payer: Self-pay

## 2011-12-24 NOTE — Telephone Encounter (Signed)
Patient wants to make sure that Dr Milus Glazier signs authorization to refill diabetes medication from [facility] pharmacy because they will send him $100 check if he fills rx with them!

## 2011-12-24 NOTE — Telephone Encounter (Signed)
Patient states the pharmacy will send [place] a request for this. I told him I do not have a listing for this pharmacy.

## 2011-12-29 ENCOUNTER — Telehealth: Payer: Self-pay

## 2011-12-29 ENCOUNTER — Other Ambulatory Visit: Payer: Self-pay | Admitting: Family Medicine

## 2011-12-29 DIAGNOSIS — J449 Chronic obstructive pulmonary disease, unspecified: Secondary | ICD-10-CM

## 2011-12-29 MED ORDER — ALBUTEROL SULFATE HFA 108 (90 BASE) MCG/ACT IN AERS: 2.0000 | INHALATION_SPRAY | Freq: Four times a day (QID) | RESPIRATORY_TRACT | Status: DC | PRN

## 2011-12-29 NOTE — Telephone Encounter (Signed)
Patient advised.

## 2011-12-29 NOTE — Telephone Encounter (Signed)
Pt called and stated that he has only been receiving 2 boxes of his albuterol inhaler recently instead of the 4 - 6 boxes he used to receive and he has been running out of it. He states that he usually uses it QID and he thinks the Rx maybe just says BID. Pt requests that Dr L send in a new Rx to [facility] that gives him the larger supply he used to get.

## 2011-12-29 NOTE — Telephone Encounter (Signed)
Reordered as requested

## 2012-01-20 ENCOUNTER — Encounter: Payer: Self-pay | Admitting: Family Medicine

## 2012-01-22 ENCOUNTER — Telehealth: Payer: Self-pay

## 2012-01-22 NOTE — Telephone Encounter (Signed)
PATIENT WANTED TO LET DR Milus Glazier KNOW THAT THEY APPROVED HIS BACK SUPPORT EQUIPMENT

## 2012-01-30 ENCOUNTER — Encounter (HOSPITAL_COMMUNITY): Payer: Self-pay | Admitting: Emergency Medicine

## 2012-01-30 ENCOUNTER — Emergency Department (INDEPENDENT_AMBULATORY_CARE_PROVIDER_SITE_OTHER)
Admission: EM | Admit: 2012-01-30 | Discharge: 2012-01-30 | Disposition: A | Discharge status: Home / Self Care | Payer: Medicare Other | Source: Home / Self Care | Attending: Family Medicine | Admitting: Family Medicine

## 2012-01-30 DIAGNOSIS — I739 Peripheral vascular disease, unspecified: Secondary | ICD-10-CM

## 2012-01-30 DIAGNOSIS — M545 Low back pain: Secondary | ICD-10-CM

## 2012-01-30 MED ORDER — HYDROCODONE-ACETAMINOPHEN 7.5-500 MG PO TABS: 1.0000 | ORAL_TABLET | Freq: Four times a day (QID) | ORAL | Status: DC | PRN

## 2012-01-30 NOTE — ED Provider Notes (Signed)
History     CSN: [id number]  Arrival date & time 01/30/12  1301   First MD Initiated Contact with Patient 01/30/12 [phone]      Chief Complaint  Patient presents with  . Leg Pain    (Consider location/radiation/quality/duration/timing/severity/associated sxs/prior treatment) HPI Comments: 71 year old former smoker male with history of diabetes, hypertension and chronic pain. Here with his son concerned about pain in left lower extremities. When asked to localize pain patient points to left medial low thigh and behind left knee. Patient reports that he experienced throbbing type of pain in his left leg when walking for at least 2 weeks. He states that he has to stop when pain starts and rest alleviates pain. Denies direct injury. He does explained that about a week ago he felt this pain and his leg gave out feeling weak and he fell down without sustaining any injury. Denies swelling or redness. No cold temperature or color changes in his toes. Patient states that he usually takes hydrocodone for pain but has run out. Also he thinks he symptoms started after starting on Januvia for his diabetes. Patient reports that blood sugar is usually around 150s-200's when checked randomly at home. He also takes pravastatin. Denies headache, blurry vision, balance or gait problems. Denies otherwise extremity weakness, numbness or paresthesias.   History reviewed. No pertinent past medical history.  History reviewed. No pertinent past surgical history.  History reviewed. No pertinent family history.  History  Substance Use Topics  . Smoking status: Former Smoker -- 2.0 packs/day for 20 years    Types: Cigarettes    Quit date: 05/02/1979  . Smokeless tobacco: Current User    Types: Chew  . Alcohol Use: Not on file      Review of Systems  Constitutional: Negative for fever, chills, appetite change and fatigue.  Respiratory: Negative for cough, chest tightness, shortness of breath and wheezing.    Cardiovascular: Negative for chest pain, palpitations and leg swelling.  Gastrointestinal: Negative for nausea, vomiting, abdominal pain and diarrhea.  Genitourinary: Negative for dysuria, frequency and hematuria.  Skin: Negative for rash.  Neurological: Negative for dizziness and headaches.  All other systems reviewed and are negative.    Allergies  Codeine and Sulfonamide derivatives  Home Medications   Current Outpatient Rx  Name  Route  Sig  Dispense  Refill  . ALBUTEROL SULFATE HFA 108 (90 BASE) MCG/ACT IN AERS   Inhalation   Inhale 2 puffs into the lungs every 6 (six) hours as needed.   6 Inhaler   4   . ONETOUCH ULTRA SYSTEM W/DEVICE KIT   Does not apply   1 kit by Does not apply route once.   1 each   0     DX code: 250.00 One touch ultra machine   . CLOBETASOL PROP EMOLLIENT BASE 0.05 % EX CREA      Use a small amount twice daily as directed   60 g   1   . ESOMEPRAZOLE MAGNESIUM 40 MG PO CPDR   Oral   Take 1 capsule (40 mg total) by mouth daily before breakfast.   90 capsule   3   . FLUTICASONE PROPIONATE 50 MCG/ACT NA SUSP      USE 1 SPRAY NASALLY TWICE DAILY   16 g   2   . HYDROCODONE-ACETAMINOPHEN 7.5-500 MG PO TABS   Oral   Take 1 tablet by mouth every 6 (six) hours as needed.   15 tablet   0   .  HYDROXYZINE PAMOATE 25 MG PO CAPS      Take one or 2 pills every 6- 8 hours as needed for itching.  Will cause drowsyness   40 capsule   1   . LORAZEPAM 2 MG PO TABS   Oral   Take 1 tablet (2 mg total) by mouth every 6 (six) hours as needed.   120 tablet   5   . LOTREL 5-20 MG PO CAPS      TAKE 1 CAPSULE DAILY   90 capsule   49   . METFORMIN HCL 1000 MG PO TABS      1/2 tablet twice daily with meals   90 tablet   3   . ONETOUCH ULTRA BLUE VI STRP      USE TO TEST BLOOD SUGAR THREE TIMES DAILY   100 each   0     Will need an office visit next month   . PRAVASTATIN SODIUM 40 MG PO TABS      TAKE 1 TABLET AT BEDTIME   90  tablet   0   . SITAGLIPTIN PHOSPHATE 50 MG PO TABS   Oral   Take 1 tablet (50 mg total) by mouth daily.   30 tablet   11   . TRIAMCINOLONE ACETONIDE 0.1 % EX CREA   Topical   Apply topically 2 (two) times daily.   454 g   3     BP 121/70  Pulse 98  Temp 97.7 F (36.5 C) (Oral)  Resp 18  SpO2 98%  Physical Exam  Nursing note and vitals reviewed. Constitutional: He is oriented to person, place, and time. He appears well-developed and well-nourished. No distress.  HENT:  Head: Normocephalic and atraumatic.  Eyes: Conjunctivae normal are normal.  Neck: No JVD present. No thyromegaly present.  Cardiovascular: Normal rate, regular rhythm and normal heart sounds.   Pulmonary/Chest: Effort normal and breath sounds normal. No respiratory distress. He has no wheezes. He has no rales. He exhibits no tenderness.  Musculoskeletal:       Left lower extremities: No swelling or erythema. No edema. There are skin dystrophic changes with thinning and hyperpigmentation in the medial aspect of both lower legs. Present but faint dorsal pedal and tibial posterior pulses. Both extremities are equally warm. No distal cyanosis.  There are also scars in the right lower leg from prior healed ulcers.  Feet with onychogryphosis/ onychomycosis and dry peeling of the of the skin around toes. Mild maceration between toes.   impress intact superficial sensation.  Neurological: He is alert and oriented to person, place, and time. He has normal strength and normal reflexes. No cranial nerve deficit or sensory deficit. He exhibits normal muscle tone. He displays a negative Romberg sign. Coordination and gait normal.  Skin: No rash noted. He is not diaphoretic.    ED Course  Procedures (including critical care time)  Labs Reviewed - No data to display No results found.   1. Claudication in peripheral vascular disease   2. Low back pain       MDM  71 year old former smoker male with history of  diabetes. Likely a vasculopath. No clinical findings suggestive of DVT. Claudication type of symptoms suggestive of arterial insufficiency. Patient was scheduled for arterial and venous duplex lower extremity ultrasound at . She reported no contraindications for taking a daily aspirin. Refilled hydrocodone #15 pills. Asked to call primary care provider for an earlier appointment for followup of his symptoms and discuss concerns  for possible Januvia side effects. Asked to go to the emergency department if new onset of swelling redness, worsening or progressing pain or weakness in his lower extremities.        Sharin Grave, MD 01/30/12 [phone]

## 2012-01-30 NOTE — ED Notes (Signed)
Reports left leg pain and right arm pain for a while.  Patient states he feels like this issue started as he started taking Januvia.

## 2012-02-02 ENCOUNTER — Ambulatory Visit (HOSPITAL_COMMUNITY): Admit: 2012-02-02 | Payer: Medicare Other

## 2012-02-02 ENCOUNTER — Ambulatory Visit (HOSPITAL_COMMUNITY): Payer: Medicare Other

## 2012-02-06 ENCOUNTER — Ambulatory Visit (HOSPITAL_COMMUNITY): Payer: Medicare Other

## 2012-02-12 ENCOUNTER — Ambulatory Visit (HOSPITAL_COMMUNITY): Payer: Medicare Other

## 2012-03-03 ENCOUNTER — Other Ambulatory Visit: Payer: Self-pay | Admitting: Family Medicine

## 2012-03-03 ENCOUNTER — Ambulatory Visit (HOSPITAL_COMMUNITY): Admission: RE | Admit: 2012-03-03 | Payer: Medicare Other | Source: Ambulatory Visit

## 2012-03-03 ENCOUNTER — Ambulatory Visit (HOSPITAL_COMMUNITY): Payer: Medicare Other | Attending: Family Medicine

## 2012-03-04 ENCOUNTER — Other Ambulatory Visit: Payer: Self-pay | Admitting: Family Medicine

## 2012-03-04 ENCOUNTER — Telehealth: Payer: Self-pay | Admitting: *Deleted

## 2012-03-04 MED ORDER — ESOMEPRAZOLE MAGNESIUM 40 MG PO CPDR: 40.0000 mg | DELAYED_RELEASE_CAPSULE | Freq: Every day | ORAL | Status: DC

## 2012-03-04 NOTE — Telephone Encounter (Signed)
Called pt to let him know, LMOM to RTC for CPE/Labs.

## 2012-03-04 NOTE — Telephone Encounter (Signed)
[facility] pharmacy requesting refill on nexium.

## 2012-03-04 NOTE — Telephone Encounter (Signed)
Nexium sent.  Needs CPE/labs/follow-up on DM

## 2012-03-10 ENCOUNTER — Other Ambulatory Visit: Payer: Self-pay | Admitting: Family Medicine

## 2012-03-10 DIAGNOSIS — M549 Dorsalgia, unspecified: Secondary | ICD-10-CM

## 2012-03-10 NOTE — Telephone Encounter (Signed)
I am unable to refill this.  Patient must go to a pain clinic.

## 2012-03-11 ENCOUNTER — Telehealth: Payer: Self-pay

## 2012-03-11 NOTE — Telephone Encounter (Signed)
Called and Darin Gonzalez from [place] stated that they just need [place] to fax one OV note from the time period between July 29/2012 and Mar 25, 2011. I faxed them a copy of OV notes/lab from 09/2010 OV.

## 2012-03-11 NOTE — Telephone Encounter (Signed)
[name] FROM [employer] STATES THEY SENT A MEDICARE AUDIT ON PT AND WANTED TO KNOW THE STATUS PLEASE CALL [phone] EXT [phone]

## 2012-03-14 ENCOUNTER — Other Ambulatory Visit: Payer: Self-pay | Admitting: Radiology

## 2012-03-14 MED ORDER — ESOMEPRAZOLE MAGNESIUM 40 MG PO CPDR: 40.0000 mg | DELAYED_RELEASE_CAPSULE | Freq: Every day | ORAL | Status: DC

## 2012-03-24 ENCOUNTER — Telehealth: Payer: Self-pay

## 2012-03-24 DIAGNOSIS — M545 Low back pain: Secondary | ICD-10-CM

## 2012-03-24 DIAGNOSIS — F419 Anxiety disorder, unspecified: Secondary | ICD-10-CM

## 2012-03-24 NOTE — Telephone Encounter (Signed)
Pended Rx for hydrocodone lorazepam and test strips, he has refills remaining on the [place] and lotrel. Please advise.

## 2012-03-24 NOTE — Telephone Encounter (Signed)
I have requested a pain clinic referral.  Ask patient to call Darin Gonzalez to see where she is with this

## 2012-03-24 NOTE — Telephone Encounter (Signed)
Pt states that he needs a refill on 4 of his hydrocodone, lorazepam 2mg , one touch ultra strips qty 300, januvia 50 mg, lotrel 5-20mg . Pt is supposed to have a appt tomorrow but due to inclement weather he is going to cancel and reschedule with the appt building. Pharmacy: [facility] [address] Phone:[phone]

## 2012-03-25 ENCOUNTER — Telehealth: Payer: Self-pay | Admitting: Radiology

## 2012-03-25 ENCOUNTER — Ambulatory Visit: Payer: Medicare Other | Admitting: Family Medicine

## 2012-03-25 NOTE — Telephone Encounter (Signed)
error 

## 2012-03-27 ENCOUNTER — Telehealth: Payer: Self-pay

## 2012-03-27 DIAGNOSIS — F419 Anxiety disorder, unspecified: Secondary | ICD-10-CM

## 2012-03-27 DIAGNOSIS — M545 Low back pain: Secondary | ICD-10-CM

## 2012-03-27 NOTE — Telephone Encounter (Signed)
Left message to return call. What medication?

## 2012-03-27 NOTE — Telephone Encounter (Signed)
NEEDS RX REFILL, WILL RUN OUT ON TUES, HAS APPT ON April 17

## 2012-03-27 NOTE — Telephone Encounter (Signed)
Patient returned call and is requesting refill on hydrocodone and lorazepam. [facility] [address]

## 2012-03-28 NOTE — Telephone Encounter (Signed)
When appointment is confirmed, I will refill until April 17

## 2012-03-29 NOTE — Telephone Encounter (Signed)
Patient does have appt with you on April 17th. Please advise. Your referral to pain clinic is in system, however it will take about 4-6 weeks for appt to be scheduled.

## 2012-03-29 NOTE — Telephone Encounter (Signed)
See Dr. L's message below 

## 2012-03-29 NOTE — Telephone Encounter (Signed)
Okay to refill until April 17th

## 2012-03-30 ENCOUNTER — Other Ambulatory Visit: Payer: Self-pay | Admitting: Family Medicine

## 2012-03-30 DIAGNOSIS — M545 Low back pain, unspecified: Secondary | ICD-10-CM

## 2012-03-30 DIAGNOSIS — G8929 Other chronic pain: Secondary | ICD-10-CM

## 2012-03-30 MED ORDER — LORAZEPAM 2 MG PO TABS: 2.0000 mg | ORAL_TABLET | Freq: Four times a day (QID) | ORAL | Status: DC | PRN

## 2012-03-30 MED ORDER — HYDROCODONE-ACETAMINOPHEN 7.5-500 MG PO TABS: 1.0000 | ORAL_TABLET | Freq: Three times a day (TID) | ORAL | Status: DC | PRN

## 2012-03-30 MED ORDER — HYDROCODONE-ACETAMINOPHEN 7.5-500 MG PO TABS: 1.0000 | ORAL_TABLET | Freq: Four times a day (QID) | ORAL | Status: DC | PRN

## 2012-03-30 NOTE — Telephone Encounter (Signed)
Dr L wrote RF for #100 tablets of Hydrocodone and advises that pt should try to taper down to TID. I faxed RF to pharmacy and notified pt that was done and that Dr L wants him to taper to TID. Pt voiced understanding.

## 2012-03-30 NOTE — Telephone Encounter (Signed)
Pt called back to check status of RFs. Advised pt that Dr L has approved and I will call them in now.

## 2012-03-30 NOTE — Telephone Encounter (Signed)
Called in Rxs to [facility] [address]

## 2012-03-30 NOTE — Telephone Encounter (Signed)
Pt called back and stated that we only gave him one day's worth of his Hydrocodone. I explained that he was given #15 tabs, and he should not be taking any more than 4 tabs a day, but that I had refilled the same amount that he had received last RF. Pt reported that he normally gets #120 to last the month. I advised him that I will check w/Dr L to make sure the amount he intends pt to have. Pt thanked [place] and Dr L for anything he can do. Dr L, please advise.

## 2012-03-30 NOTE — Telephone Encounter (Signed)
Patient has called back to ask about his rx. I see that it has been approved, has this been called in? It's not at the front desk and he has no transportation to pick it up from here so he would like it called in.

## 2012-04-06 ENCOUNTER — Telehealth: Payer: Self-pay | Admitting: *Deleted

## 2012-04-06 DIAGNOSIS — J449 Chronic obstructive pulmonary disease, unspecified: Secondary | ICD-10-CM

## 2012-04-06 MED ORDER — ALBUTEROL SULFATE HFA 108 (90 BASE) MCG/ACT IN AERS: 2.0000 | INHALATION_SPRAY | Freq: Four times a day (QID) | RESPIRATORY_TRACT | Status: DC | PRN

## 2012-04-06 NOTE — Telephone Encounter (Signed)
I will refill one time, however patient was given 4 refills in November. If he is using them that frequently he must RTC to discuss.

## 2012-04-06 NOTE — Telephone Encounter (Signed)
[facility] pharmacy requesting refill on proair inhaler

## 2012-04-07 NOTE — Telephone Encounter (Signed)
Patient has appt on 4/17 /14 with Dr Milus Glazier. Have forwarded to him so he will be aware patient has used 4 refills since Nov. Jeneal Vogl

## 2012-04-09 ENCOUNTER — Other Ambulatory Visit: Payer: Self-pay | Admitting: Family Medicine

## 2012-04-12 ENCOUNTER — Telehealth: Payer: Self-pay | Admitting: *Deleted

## 2012-04-12 NOTE — Telephone Encounter (Signed)
Error

## 2012-04-15 ENCOUNTER — Telehealth: Payer: Self-pay | Admitting: *Deleted

## 2012-04-19 ENCOUNTER — Telehealth: Payer: Self-pay

## 2012-04-19 DIAGNOSIS — J45909 Unspecified asthma, uncomplicated: Secondary | ICD-10-CM

## 2012-04-19 NOTE — Telephone Encounter (Signed)
It is unclear from chart review exactly what goes in his nebulizer. Please advise. Pended order for nebulizer machine. Unsure for what diagnosis the nebulizer is for.

## 2012-04-19 NOTE — Telephone Encounter (Signed)
Pt is  Requesting a new nebulizer (his broke)  [facility] [address]  It must be covered under medicaid and medicare  [phone] (H)

## 2012-04-19 NOTE — Telephone Encounter (Signed)
I think this is probably fine, but let's see if we can find out what the nebulizer is for as it is unclear from his chart.  I only see a ProAir inhaler on his med list.  It would also, of course, be good for the pt to understand what he is using it for!  I will leave the order pended until we have more info

## 2012-04-20 NOTE — Telephone Encounter (Signed)
Patient states he has asthma and he uses Albuterol in his nebulizer 0.083 % is what he currently has. He got this through [facility].

## 2012-04-20 NOTE — Telephone Encounter (Signed)
He is scheduled for his physical with Dr L in April. Faxed information to [facility] patient advised.

## 2012-04-20 NOTE — Telephone Encounter (Signed)
I have signed the order for a new nebulizer.  It's probably time for pt to come in for a visit as well, as he was last seen in Aug 2013

## 2012-04-21 NOTE — Telephone Encounter (Signed)
Januvia was sent in with 11 refills in Aug, he should have plenty. Please advise on Lorazepam. Pt has appt with Dr Milus Glazier in April for his physical. He had Rx for #120 Lorazepam beginning of the month, (2mg )this seems like a lot.

## 2012-04-21 NOTE — Telephone Encounter (Addendum)
Pt states that 100 strips was called in but he was supposed to have 300 strips. Pt also would like a refill on lorazepam and januvia.  Best# [phone]  Pharmacy: [facility]

## 2012-04-23 NOTE — Telephone Encounter (Signed)
Called [facility] to advise he should have enough lorazepam and Januvia they need to check hard copy on Januvia.

## 2012-04-23 NOTE — Telephone Encounter (Signed)
On 03/30/12 Dr. Milus Glazier authorized #120 lorazepam 2 mg.  He shouldn't be out.

## 2012-04-23 NOTE — Telephone Encounter (Signed)
Left message for [facility] to call  Me back.

## 2012-04-23 NOTE — Telephone Encounter (Signed)
Spoke to [employer] to advise . He does have plenty of Januvia. They will let him know the Lorazepam must last until his follow up in April

## 2012-04-24 ENCOUNTER — Telehealth: Payer: Self-pay

## 2012-04-24 DIAGNOSIS — F419 Anxiety disorder, unspecified: Secondary | ICD-10-CM

## 2012-04-24 DIAGNOSIS — M545 Low back pain: Secondary | ICD-10-CM

## 2012-04-24 NOTE — Telephone Encounter (Signed)
PATIENT IS WONDERING WHERE HIS LORAZEPAM RX IS. I READ CHELLE'S NOTE THAT IT IS TOO SOON AND RELAYED THAT TO PATIENT. HE ALSO SAYS HE ONLY GOT 15 HYDROCODONE PILLS WHEN HE SHOULD HAVE 120, I INFORMED HIM IT IS PROBABLY JUST ENOUGH TO HOLD HIM OVER UNTIL DR L COMES BACK, HE SAYS 15 WILL ONLY LAST HIM 5 DAYS-MAYBE LESS SINCE HE SOMETIMES TAKES 4/DAY SINCE HIS LEG IS BOTHERING HIM. I TOLD HIM SOMEONE WOULD LOOK INTO IT AND GET BACK TO HIM.  BEST [phone]

## 2012-04-26 MED ORDER — GLUCOSE BLOOD VI STRP: ORAL_STRIP | Status: DC

## 2012-04-26 NOTE — Telephone Encounter (Signed)
Pt called back to check on RFs of Hydrocodone, Lorazepam, and his testing strips to last him until his appt w/Dr L on 06/10/12. I called pharmacy to verify that pt got Hydrocodone #100 on 03/30/12 - Dr L had decreased # down to #100 and advised pt to try to taper down to TID (see 03/27/12 phone message). On the same day (03/30/12) we also called in #15 of the Hydrocodone which the pharmacy put on file (this was mistakenly requested for #15 from pharmacy d/t being confused w/pt's son's Rx that had been for that amt from dentist). The pharmacy filled that #15 that was on file on 04/24/12. Pt requests enough to last until appt.  Pharmacy reports that pt received lorazepam #120 on 03/30/12. Pt reports that he still has some, but will need a RF to last him until appt.  I have sent in another #300 test strips per protocol. Pt prefers to get these in a 3 mos supply. Please advise on the hydrocodone and lorazepam RFs.

## 2012-04-26 NOTE — Telephone Encounter (Signed)
Called patient, he should contact pharmacy if he was given the incorrect amount on his hydrocodone. We can not change this.

## 2012-04-26 NOTE — Telephone Encounter (Signed)
I advised pt that I have sent in his RF for testing strips in 3 mos supply, but that the other two Rxs need to be reviewed by the provider who is covering for Dr L's pts while he is away. Advised pt that this provider will not be in the office until tomorrow evening and we will call him after she reviews the requests. Pt agreed and I advised that he can call back and ask for me to check status if he hasn't heard from [place] by mid-day on Wed.

## 2012-04-27 MED ORDER — LORAZEPAM 2 MG PO TABS: 2.0000 mg | ORAL_TABLET | Freq: Four times a day (QID) | ORAL | Status: DC | PRN

## 2012-04-27 MED ORDER — HYDROCODONE-ACETAMINOPHEN 7.5-500 MG PO TABS: 1.0000 | ORAL_TABLET | Freq: Three times a day (TID) | ORAL | Status: DC | PRN

## 2012-04-27 NOTE — Telephone Encounter (Signed)
Faxed Rxs for hydrocodone and lorazepam to [facility], and notified pt.

## 2012-04-27 NOTE — Telephone Encounter (Signed)
I will send enough to get him through until Dr.L is back.

## 2012-04-28 ENCOUNTER — Telehealth: Payer: Self-pay | Admitting: Radiology

## 2012-04-28 NOTE — Telephone Encounter (Signed)
Patients form is filled out for his diabetes supplies it is in my box, please sign,

## 2012-05-06 ENCOUNTER — Telehealth: Payer: Self-pay

## 2012-05-06 NOTE — Telephone Encounter (Signed)
Needs ov

## 2012-05-06 NOTE — Telephone Encounter (Signed)
Patient is requesting fluid pill for his knee [facility] [address] and [address]  [place] earlier for appointment.   [phone] (H)

## 2012-05-06 NOTE — Telephone Encounter (Signed)
He is advised of need for office

## 2012-05-06 NOTE — Telephone Encounter (Signed)
Pt called to ask Dr L about RFing his hydrocodone. We had RFd it for half a month on 04/27/12 for #50, and he has about 5 days left, so he wanted to go ahead and check w/Dr L about RF for when he would be due for more. Dr L, please advise. I checked with Referrals about his referral to a pain clinic and Lupita Leash is not in today and Lynnell Dike is not sure what the notes on this referral mean, so we'll have to check back to check status w/Donna tomorrow.

## 2012-05-07 NOTE — Telephone Encounter (Signed)
Come in for evaluation.

## 2012-05-07 NOTE — Telephone Encounter (Signed)
I advised him again of need for appt.

## 2012-05-10 ENCOUNTER — Telehealth: Payer: Self-pay

## 2012-05-10 NOTE — Telephone Encounter (Signed)
[facility] called requesting RF of pt's albuterol neb med, 0.083% vials. Lanora Manis had Rxd #30 on 04/20/12 but pt reports he uses it TID and is now out. Dr L, do you want to RF this for pt at TID dose? Pt was advised last week that he does need to f/up with you for OV. Do you want to give him a mos to hold him until then?

## 2012-05-11 MED ORDER — ALBUTEROL SULFATE (2.5 MG/3ML) 0.083% IN NEBU: 2.5000 mg | INHALATION_SOLUTION | Freq: Three times a day (TID) | RESPIRATORY_TRACT | Status: DC

## 2012-05-11 NOTE — Telephone Encounter (Signed)
Patient would like albuterol sent to [facility]

## 2012-05-11 NOTE — Telephone Encounter (Signed)
Ok to refill nebulizer for tid use x 1 year

## 2012-05-15 ENCOUNTER — Telehealth: Payer: Self-pay

## 2012-05-15 NOTE — Telephone Encounter (Signed)
PT SAYS THE MEDICINE DR. L PUT HIM ON JANUVIA AND THAT EVER SINCE HE HAS HAD LEG PAIN. SAID HE IS GOING TO QUIT TAKING IT AND GO BACK TO HIS METFORMIN.  PLEASE CALL ASAP [phone]

## 2012-05-16 NOTE — Telephone Encounter (Signed)
Dr L should he stop taking the medicine? Please advise.

## 2012-05-16 NOTE — Telephone Encounter (Signed)
Have patient resume the metformin and come in sometime this week.

## 2012-05-17 NOTE — Telephone Encounter (Signed)
Thanks, patient advised. He has appt scheduled already.

## 2012-05-20 ENCOUNTER — Encounter: Payer: Self-pay | Admitting: Family Medicine

## 2012-05-20 ENCOUNTER — Ambulatory Visit (INDEPENDENT_AMBULATORY_CARE_PROVIDER_SITE_OTHER): Payer: Medicare Other | Admitting: Family Medicine

## 2012-05-20 ENCOUNTER — Telehealth: Payer: Self-pay | Admitting: Family Medicine

## 2012-05-20 VITALS — BP 110/64 | HR 111 | Temp 98.2°F | Resp 16 | Ht 66.0 in | Wt 256.0 lb

## 2012-05-20 DIAGNOSIS — J449 Chronic obstructive pulmonary disease, unspecified: Secondary | ICD-10-CM

## 2012-05-20 DIAGNOSIS — K219 Gastro-esophageal reflux disease without esophagitis: Secondary | ICD-10-CM

## 2012-05-20 DIAGNOSIS — E1059 Type 1 diabetes mellitus with other circulatory complications: Secondary | ICD-10-CM

## 2012-05-20 DIAGNOSIS — L309 Dermatitis, unspecified: Secondary | ICD-10-CM

## 2012-05-20 DIAGNOSIS — E119 Type 2 diabetes mellitus without complications: Secondary | ICD-10-CM | POA: Diagnosis not present

## 2012-05-20 DIAGNOSIS — F419 Anxiety disorder, unspecified: Secondary | ICD-10-CM

## 2012-05-20 DIAGNOSIS — J309 Allergic rhinitis, unspecified: Secondary | ICD-10-CM

## 2012-05-20 DIAGNOSIS — F411 Generalized anxiety disorder: Secondary | ICD-10-CM

## 2012-05-20 DIAGNOSIS — M545 Low back pain, unspecified: Secondary | ICD-10-CM | POA: Diagnosis not present

## 2012-05-20 DIAGNOSIS — I10 Essential (primary) hypertension: Secondary | ICD-10-CM | POA: Diagnosis not present

## 2012-05-20 DIAGNOSIS — E785 Hyperlipidemia, unspecified: Secondary | ICD-10-CM | POA: Diagnosis not present

## 2012-05-20 DIAGNOSIS — M549 Dorsalgia, unspecified: Secondary | ICD-10-CM

## 2012-05-20 LAB — COMPREHENSIVE METABOLIC PANEL
ALT: 8 U/L (ref 0–53)
AST: 16 U/L (ref 0–37)
Albumin: 4.7 g/dL (ref 3.5–5.2)
Alkaline Phosphatase: 55 U/L (ref 39–117)
BUN: 7 mg/dL (ref 6–23)
CO2: 27 mEq/L (ref 19–32)
Calcium: 9.7 mg/dL (ref 8.4–10.5)
Chloride: 98 mEq/L (ref 96–112)
Creat: 1.15 mg/dL (ref 0.50–1.35)
Glucose, Bld: 150 mg/dL — ABNORMAL HIGH (ref 70–99)
Potassium: 4.2 mEq/L (ref 3.5–5.3)
Sodium: 137 mEq/L (ref 135–145)
Total Bilirubin: 1.1 mg/dL (ref 0.3–1.2)
Total Protein: 7.2 g/dL (ref 6.0–8.3)

## 2012-05-20 LAB — POCT GLYCOSYLATED HEMOGLOBIN (HGB A1C): Hemoglobin A1C: 7.1

## 2012-05-20 LAB — LIPID PANEL
Cholesterol: 159 mg/dL (ref 0–200)
HDL: 50 mg/dL (ref >39)
LDL Cholesterol: 79 mg/dL (ref 0–99)
Total CHOL/HDL Ratio: 3.2 Ratio
Triglycerides: 149 mg/dL (ref <150)
VLDL: 30 mg/dL (ref 0–40)

## 2012-05-20 MED ORDER — LORAZEPAM 2 MG PO TABS: 2.0000 mg | ORAL_TABLET | Freq: Four times a day (QID) | ORAL | Status: DC | PRN

## 2012-05-20 MED ORDER — ALBUTEROL SULFATE HFA 108 (90 BASE) MCG/ACT IN AERS: 2.0000 | INHALATION_SPRAY | Freq: Four times a day (QID) | RESPIRATORY_TRACT | Status: DC | PRN

## 2012-05-20 MED ORDER — AMLODIPINE BESY-BENAZEPRIL HCL 5-20 MG PO CAPS: 1.0000 | ORAL_CAPSULE | Freq: Every day | ORAL | Status: DC

## 2012-05-20 MED ORDER — HYDROXYZINE PAMOATE 25 MG PO CAPS: ORAL_CAPSULE | ORAL | Status: DC

## 2012-05-20 MED ORDER — FLUTICASONE PROPIONATE 50 MCG/ACT NA SUSP: 1.0000 | Freq: Every day | NASAL | Status: DC

## 2012-05-20 MED ORDER — PRAVASTATIN SODIUM 40 MG PO TABS: 40.0000 mg | ORAL_TABLET | Freq: Every day | ORAL | Status: DC

## 2012-05-20 MED ORDER — METFORMIN HCL 1000 MG PO TABS: 500.0000 mg | ORAL_TABLET | Freq: Two times a day (BID) | ORAL | Status: DC

## 2012-05-20 MED ORDER — HYDROCODONE-ACETAMINOPHEN 7.5-500 MG PO TABS: 1.0000 | ORAL_TABLET | Freq: Three times a day (TID) | ORAL | Status: DC | PRN

## 2012-05-20 MED ORDER — ALBUTEROL SULFATE (2.5 MG/3ML) 0.083% IN NEBU: 2.5000 mg | INHALATION_SOLUTION | Freq: Three times a day (TID) | RESPIRATORY_TRACT | Status: DC

## 2012-05-20 MED ORDER — ESOMEPRAZOLE MAGNESIUM 40 MG PO CPDR: 40.0000 mg | DELAYED_RELEASE_CAPSULE | Freq: Every day | ORAL | Status: DC

## 2012-05-20 NOTE — Telephone Encounter (Signed)
Faxed Rx: Albuterol inhaler, Inhale 2 puffs q6h prn. Qty=6, RF=4. Proventil 0.083% neb sol. Take 3 MLS q8h. Qty 270. RF 12

## 2012-05-20 NOTE — Patient Instructions (Addendum)

## 2012-05-20 NOTE — Progress Notes (Signed)
Patient ID: Darin Gonzalez MRN: [id number], DOB: 23-Nov-1940, 72 y.o. Date of Encounter: 05/20/2012, 12:41 PM  Primary Physician: Elvina Sidle, MD  Chief Complaint: Diabetes follow up  HPI: 72 y.o. year old male with history below presents for follow up of diabetes mellitus. Doing well. No issues or complaints. Taking medications daily without adverse effects. No polydipsia, polyphagia, polyuria, or nocturia.  Blood sugars at home:  150-220 Diet consists of:  No sodas Exercise: never Last A1C:  10.9 last fall.  Eye MD:  Dr. Dione Booze, patient will make appt DDS:  dentures Influenza vaccine: 2013 Pneumococcal vaccine: 2013  I have repeatedly tried to refer patient to pain clinic without success so far.  He has transportation problems.  Nephew brought him today.   No past medical history on file.   Home Meds: Prior to Admission medications   Medication Sig Start Date End Date Taking? Authorizing Provider  albuterol (PROAIR HFA) 108 (90 BASE) MCG/ACT inhaler Inhale 2 puffs into the lungs every 6 (six) hours as needed. 04/06/12  Yes Ryan M Dunn, PA-C  albuterol (PROVENTIL) (2.5 MG/3ML) 0.083% nebulizer solution Take 3 mLs (2.5 mg total) by nebulization every 8 (eight) hours. 05/11/12  Yes Elvina Sidle, MD  Blood Glucose Monitoring Suppl (ONE TOUCH ULTRA SYSTEM KIT) W/DEVICE KIT 1 kit by Does not apply route once. 11/10/11  Yes Elvina Sidle, MD  esomeprazole (NEXIUM) 40 MG capsule Take 1 capsule (40 mg total) by mouth daily before breakfast. 03/14/12  Yes Eleanore E Egan, PA-C  fluticasone (FLONASE) 50 MCG/ACT nasal spray USE 1 SPRAY NASALLY TWICE DAILY 09/24/11  Yes Marzella Schlein McClung, PA-C  glucose blood (ONE TOUCH ULTRA TEST) test strip Use to test blood sugar three times daily. 04/26/12  Yes Elvina Sidle, MD  HYDROcodone-acetaminophen (LORTAB) 7.5-500 MG per tablet Take 1 tablet by mouth every 8 (eight) hours as needed. 04/27/12  Yes Marzella Schlein McClung, PA-C  LORazepam (ATIVAN) 2 MG  tablet Take 1 tablet (2 mg total) by mouth every 6 (six) hours as needed. 04/27/12  Yes Anders Simmonds, PA-C  LOTREL 5-20 MG per capsule TAKE 1 CAPSULE DAILY 09/17/11  Yes Anders Simmonds, PA-C  metFORMIN (GLUCOPHAGE) 1000 MG tablet TAKE ONE-HALF TABLET TWICE DAILY WITH MEALS 03/03/12  Yes Marzella Schlein McClung, PA-C  pravastatin (PRAVACHOL) 40 MG tablet TAKE 1 TABLET (40MG  TOTAL) BY MOUTH DAILY 03/04/12  Yes Anders Simmonds, PA-C  Clobetasol Prop Emollient Base (CLOBETASOL PROPIONATE E) 0.05 % emollient cream Use a small amount twice daily as directed 08/13/11   Raymon Mutton Dunn, PA-C  hydrOXYzine (VISTARIL) 25 MG capsule Take one or 2 pills every 6- 8 hours as needed for itching.  Will cause drowsyness 08/03/11   Peyton Najjar, MD  triamcinolone cream (KENALOG) 0.1 % Apply topically 2 (two) times daily. 09/30/11   Anders Simmonds, PA-C    Allergies:  Allergies  Allergen Reactions  . Codeine   . Sulfonamide Derivatives     History   Social History  . Marital Status: Legally Separated    Spouse Name: N/A    Number of Children: N/A  . Years of Education: N/A   Occupational History  . Not on file.   Social History Main Topics  . Smoking status: Former Smoker -- 2.00 packs/day for 20 years    Types: Cigarettes    Quit date: 05/02/1979  . Smokeless tobacco: Current User    Types: Chew  . Alcohol Use: Not on file  . Drug  Use: Not on file  . Sexually Active: Not on file   Other Topics Concern  . Not on file   Social History Narrative  . No narrative on file     Review of Systems: Constitutional: negative for chills, fever, night sweats, weight changes, or fatigue  HEENT: negative for vision changes, hearing loss, congestion, rhinorrhea, or epistaxis Cardiovascular: negative for chest pain, palpitations, diaphoresis, DOE, orthopnea, or edema Respiratory: negative for hemoptysis, wheezing, shortness of breath, dyspnea, or cough Abdominal: negative for abdominal pain, nausea, vomiting,  diarrhea, or constipation Dermatological: negative for  wounds Neurologic: negative for headache, dizziness, or syncope Renal:  Negative for polyuria, polydipsia, or dysuria All other systems reviewed and are otherwise negative with the exception to those above and in the HPI.   Physical Exam: Blood pressure 110/64, pulse 111, temperature 98.2 F (36.8 C), temperature source Oral, resp. rate 16, height 5\' 6"  (1.676 m), weight 256 lb (116.121 kg), SpO2 95.00%., Body mass index is 41.34 kg/(m^2). General: Well developed, well nourished, in no acute distress. Head: Normocephalic, atraumatic, eyes without discharge, sclera non-icteric, nares are without discharge. Bilateral auditory canals clear, TM's are without perforation, pearly grey and translucent with reflective cone of light bilaterally. Oral cavity moist, posterior pharynx without exudate, erythema, peritonsillar abscess, or post nasal drip.  Neck: Supple. No thyromegaly. Full ROM. No lymphadenopathy. Lungs: Clear bilaterally to auscultation without wheezes, rales, or rhonchi. Breathing is unlabored. Heart: RRR with S1 S2. No murmurs, rubs, or gallops appreciated. Abdomen: Soft, non-tender, non-distended with normoactive bowel sounds. No hepatosplenomegaly. No rebound/guarding. No obvious abdominal masses. Msk:  Strength and tone normal for age. Extremities/Skin: Warm and dry. No clubbing or cyanosis. No edema. No rashes, wounds, or suspicious lesions. Monofilament exam negative  Neuro: Alert and oriented X 3. Moves all extremities spontaneously. Gait is normal. CNII-XII grossly in tact. Psych:  Responds to questions appropriately with a normal affect.  Bilateral foot scaling:  Spent 10 minutes cleaning feet from interdigital detritus Superficial 2-3 cm irreg erythematous rash left shin  ASSESSMENT AND PLAN:  72 y.o. year old male with diabetes, back pain -Eczema - Plan: hydrOXYzine (VISTARIL) 25 MG capsule  COPD (chronic obstructive  pulmonary disease) - Plan: albuterol (PROAIR HFA) 108 (90 BASE) MCG/ACT inhaler  Other and unspecified hyperlipidemia - Plan: pravastatin (PRAVACHOL) 40 MG tablet  Type I (juvenile type) diabetes mellitus with peripheral circulatory disorders, not stated as uncontrolled(250.71) - Plan: metFORMIN (GLUCOPHAGE) 1000 MG tablet  GERD (gastroesophageal reflux disease) - Plan: esomeprazole (NEXIUM) 40 MG capsule  Allergic rhinitis - Plan: fluticasone (FLONASE) 50 MCG/ACT nasal spray  Essential hypertension, benign - Plan: amLODipine-benazepril (LOTREL) 5-20 MG per capsule  Low back pain - Plan: LORazepam (ATIVAN) 2 MG tablet, HYDROcodone-acetaminophen (LORTAB) 7.5-500 MG per tablet, Ambulatory referral to Pain Clinic, DISCONTINUED: LORazepam (ATIVAN) 2 MG tablet  Anxiety - Plan: LORazepam (ATIVAN) 2 MG tablet, DISCONTINUED: LORazepam (ATIVAN) 2 MG tablet  Anxiety state, unspecified - Plan: LORazepam (ATIVAN) 2 MG tablet, DISCONTINUED: LORazepam (ATIVAN) 2 MG tablet  Back pain - Plan: HYDROcodone-acetaminophen (LORTAB) 7.5-500 MG per tablet  bactroban to left shin rash  Recheck 4 months.  Signed, Elvina Sidle, MD 05/20/2012 12:41 PM

## 2012-05-26 ENCOUNTER — Encounter: Payer: Self-pay | Admitting: Physical Medicine & Rehabilitation

## 2012-06-10 ENCOUNTER — Encounter: Payer: Medicare Other | Admitting: Family Medicine

## 2012-06-30 ENCOUNTER — Telehealth: Payer: Self-pay | Admitting: Radiology

## 2012-06-30 NOTE — Telephone Encounter (Signed)
[facility] wants chart notes and labs on patient for his supplies, fax [phone]

## 2012-07-01 NOTE — Telephone Encounter (Signed)
Last office notes faxed

## 2012-07-05 ENCOUNTER — Telehealth: Payer: Self-pay

## 2012-07-05 MED ORDER — GLUCOSE BLOOD VI STRP: ORAL_STRIP | Status: DC

## 2012-07-05 NOTE — Telephone Encounter (Signed)
Pt called and stated that he just p/up his DM test strips and only got #100 instead of the #300 he normally gets. Requests that we send in a RF for the #300. Pt states he checks BS TID d/t fluctuating BS, and he has appt sch in Aug. I will send in a RF for #300.

## 2012-07-12 ENCOUNTER — Ambulatory Visit: Payer: Medicare Other | Admitting: Physical Medicine & Rehabilitation

## 2012-07-19 ENCOUNTER — Telehealth: Payer: Self-pay

## 2012-07-19 NOTE — Telephone Encounter (Signed)
Pt called to let Dr L know that he is back in town from funeral. He wanted Dr L to know that it was his nephew that passed away - his brother Spero Curb (who was also a pt of Dr L) son. He has resheduled his appt w/pain clinic, but will run out of pain meds before then. I advised pt that per Dr L, he will have to wait until he sees pain specialist for Rx, Dr L can not RF any more. Pt voiced understanding. Dr L, forwarding this FYI.

## 2012-08-02 ENCOUNTER — Other Ambulatory Visit: Payer: Self-pay | Admitting: Family Medicine

## 2012-08-02 NOTE — Telephone Encounter (Signed)
[facility] called, on pt's behalf, to request Rf of hydrocodone #300 to last pt until his pain management appt on 09/10/12.

## 2012-08-07 ENCOUNTER — Telehealth: Payer: Self-pay

## 2012-08-07 NOTE — Telephone Encounter (Signed)
Patient states that he no longer has Hydrocodone. And that his appt with the pain management dr is not until July 19. He would like [place] to refill his medication until the date of his appt. Please call him back at [phone]. Thanks

## 2012-08-07 NOTE — Telephone Encounter (Signed)
Spoke to pt. Advised he would need to come in for an OV to have this medication refilled- We have not seen him since March. At that time he was referred to pain management.

## 2012-08-12 ENCOUNTER — Other Ambulatory Visit: Payer: Self-pay | Admitting: Family Medicine

## 2012-08-21 ENCOUNTER — Other Ambulatory Visit: Payer: Self-pay | Admitting: Family Medicine

## 2012-08-24 ENCOUNTER — Other Ambulatory Visit: Payer: Self-pay | Admitting: Family Medicine

## 2012-08-24 NOTE — Telephone Encounter (Signed)
PT WOULD LIKE A CALL BACK FROM DR Kenyon Ana, DIDN'T WANT TO SAY WHAT IT WAS ABOUT PLEASE CALL [phone]

## 2012-08-25 NOTE — Telephone Encounter (Signed)
Do you want to renew the bactroban? I have again declined the Hydrocodone, as per last discussion

## 2012-09-01 ENCOUNTER — Other Ambulatory Visit: Payer: Self-pay | Admitting: Family Medicine

## 2012-09-02 ENCOUNTER — Other Ambulatory Visit: Payer: Self-pay | Admitting: Family Medicine

## 2012-09-02 NOTE — Telephone Encounter (Signed)
Dr L, do you want to RF this or need to re-eval first?

## 2012-09-03 ENCOUNTER — Telehealth: Payer: Self-pay

## 2012-09-03 NOTE — Telephone Encounter (Signed)
Patient called requesting hydrocodone refill.  He has never been seen in our office.  He was referred by Dr Carlos Levering?  Informed patient we do not prescribe medication until usually the second visit.  Patient understands and is going to contact previous provider.

## 2012-09-09 ENCOUNTER — Telehealth: Payer: Self-pay

## 2012-09-09 NOTE — Telephone Encounter (Signed)
Pended/ please advised.

## 2012-09-09 NOTE — Telephone Encounter (Addendum)
Patient would a prescription for 800mg  of Ibuprofen. Patient uses [facility] on  [address]. [phone]

## 2012-09-09 NOTE — Telephone Encounter (Signed)
Patient is in pain and would like Ibuprofen prescribed at earliest convenience.

## 2012-09-10 ENCOUNTER — Other Ambulatory Visit: Payer: Self-pay | Admitting: Family Medicine

## 2012-09-10 MED ORDER — IBUPROFEN 800 MG PO TABS: 800.0000 mg | ORAL_TABLET | Freq: Three times a day (TID) | ORAL | Status: DC | PRN

## 2012-09-10 MED ORDER — IBUPROFEN 600 MG PO TABS
600.0000 mg | ORAL_TABLET | Freq: Three times a day (TID) | ORAL | Status: DC | PRN
Start: 2012-09-10 — End: 2012-11-22

## 2012-09-10 NOTE — Telephone Encounter (Signed)
I refilled the ibuprofen at 6:30 this morning

## 2012-09-10 NOTE — Telephone Encounter (Signed)
Pt called again for status of refill.   Please call pt   bf

## 2012-09-11 NOTE — Telephone Encounter (Signed)
Pt aware of refill waiting

## 2012-09-20 ENCOUNTER — Ambulatory Visit: Payer: Medicare Other | Admitting: Physical Medicine & Rehabilitation

## 2012-10-14 ENCOUNTER — Encounter: Payer: Medicare Other | Admitting: Family Medicine

## 2012-10-19 ENCOUNTER — Telehealth: Payer: Self-pay

## 2012-10-19 NOTE — Telephone Encounter (Signed)
Patient aware Dr Milus Glazier will not provide this for him any longer , called again,to advise. Phone number not valid

## 2012-10-19 NOTE — Telephone Encounter (Signed)
PT STATES HE HAVE BEEN OUT OF HIS HYDROCODONE FOR A COUPLE OF WEEKS NOW AND IS IN A LOT OF PAIN, REALLY WOULD LIKE TO HAVE SOME CALLED IN FOR HIM PLEASE CALL [phone]   [facility] [address]

## 2012-10-20 ENCOUNTER — Other Ambulatory Visit: Payer: Self-pay | Admitting: Family Medicine

## 2012-10-22 ENCOUNTER — Encounter: Payer: Medicare Other | Attending: Physical Medicine & Rehabilitation

## 2012-10-22 ENCOUNTER — Ambulatory Visit: Payer: Medicare Other | Admitting: Physical Medicine & Rehabilitation

## 2012-10-25 ENCOUNTER — Telehealth: Payer: Self-pay

## 2012-10-25 MED ORDER — GLUCOSE BLOOD VI STRP: ORAL_STRIP | Status: DC

## 2012-10-25 NOTE — Telephone Encounter (Signed)
Pt called again about hydrocodone, understood that it can't be refilled. Pt does need the lorazepam and glucose blood test refilled. Call at [phone].

## 2012-10-25 NOTE — Telephone Encounter (Signed)
Patient advised you can not prescribe the hydrocodone, requesting Lorazepam, do I need to advise you can not fill this either? Test strips sent in.

## 2012-10-25 NOTE — Telephone Encounter (Signed)
He needs to get these from someone else.  They are controlled substances

## 2012-10-26 ENCOUNTER — Other Ambulatory Visit: Payer: Self-pay | Admitting: Family Medicine

## 2012-10-26 NOTE — Telephone Encounter (Signed)
Advised pt that he must get controlled meds from pain clinic we cannot refill them and that test strips have been sent in.

## 2012-10-27 NOTE — Telephone Encounter (Signed)
This should be done by pain management doctors.

## 2012-10-29 ENCOUNTER — Other Ambulatory Visit: Payer: Self-pay | Admitting: Family Medicine

## 2012-10-30 ENCOUNTER — Other Ambulatory Visit: Payer: Self-pay | Admitting: Family Medicine

## 2012-10-30 DIAGNOSIS — M545 Low back pain, unspecified: Secondary | ICD-10-CM

## 2012-10-30 DIAGNOSIS — F419 Anxiety disorder, unspecified: Secondary | ICD-10-CM

## 2012-10-30 DIAGNOSIS — F411 Generalized anxiety disorder: Secondary | ICD-10-CM

## 2012-10-30 MED ORDER — LORAZEPAM 2 MG PO TABS: 2.0000 mg | ORAL_TABLET | Freq: Three times a day (TID) | ORAL | Status: DC | PRN

## 2012-10-30 NOTE — Telephone Encounter (Signed)
Pt called back and per Dr L on prev Refill req and also phone message advised pt that Dr L can not Rx any more controlled subst, he will need to get the Lorazepam from pain MD also. Pt agreed.

## 2012-11-22 ENCOUNTER — Other Ambulatory Visit: Payer: Self-pay

## 2012-11-22 MED ORDER — IBUPROFEN 800 MG PO TABS: 800.0000 mg | ORAL_TABLET | Freq: Three times a day (TID) | ORAL | Status: DC | PRN

## 2012-12-09 ENCOUNTER — Telehealth: Payer: Self-pay

## 2012-12-09 ENCOUNTER — Other Ambulatory Visit: Payer: Self-pay | Admitting: Radiology

## 2012-12-09 DIAGNOSIS — I10 Essential (primary) hypertension: Secondary | ICD-10-CM

## 2012-12-09 DIAGNOSIS — K219 Gastro-esophageal reflux disease without esophagitis: Secondary | ICD-10-CM

## 2012-12-09 DIAGNOSIS — J449 Chronic obstructive pulmonary disease, unspecified: Secondary | ICD-10-CM

## 2012-12-09 DIAGNOSIS — E785 Hyperlipidemia, unspecified: Secondary | ICD-10-CM

## 2012-12-09 MED ORDER — ALBUTEROL SULFATE HFA 108 (90 BASE) MCG/ACT IN AERS: 2.0000 | INHALATION_SPRAY | Freq: Four times a day (QID) | RESPIRATORY_TRACT | Status: DC | PRN

## 2012-12-09 MED ORDER — PRAVASTATIN SODIUM 40 MG PO TABS: 40.0000 mg | ORAL_TABLET | Freq: Every day | ORAL | Status: DC

## 2012-12-09 MED ORDER — AMLODIPINE BESY-BENAZEPRIL HCL 5-20 MG PO CAPS: 1.0000 | ORAL_CAPSULE | Freq: Every day | ORAL | Status: DC

## 2012-12-09 MED ORDER — ESOMEPRAZOLE MAGNESIUM 40 MG PO CPDR: 40.0000 mg | DELAYED_RELEASE_CAPSULE | Freq: Every day | ORAL | Status: DC

## 2012-12-09 NOTE — Telephone Encounter (Signed)
Ok to refill x 1 month, come in the next 30 days

## 2012-12-09 NOTE — Telephone Encounter (Signed)
Pt states his home prescription services are going out of business and he now wants all his meds transferred to [facility] at [facility] [address]   Best phone for pt is [phone]

## 2012-12-09 NOTE — Telephone Encounter (Signed)
Thank you. Called him, to verify which Rx he needs.

## 2012-12-09 NOTE — Addendum Note (Signed)
Addended byCaffie Damme on: 12/09/2012 01:55 PM   Modules accepted: Orders

## 2012-12-09 NOTE — Telephone Encounter (Signed)
He is due for office visit. Do you need to see him to renew meds?

## 2012-12-15 DIAGNOSIS — Z23 Encounter for immunization: Secondary | ICD-10-CM | POA: Diagnosis not present

## 2012-12-29 ENCOUNTER — Other Ambulatory Visit: Payer: Self-pay | Admitting: Physician Assistant

## 2013-01-03 ENCOUNTER — Telehealth: Payer: Self-pay

## 2013-01-03 NOTE — Telephone Encounter (Signed)
Pt requesting an inflammatory drug be called in for his leg pain   Best phone [phone]   Pharmacy [facility] [address]

## 2013-01-04 NOTE — Telephone Encounter (Signed)
Either take advil or come in to be seen

## 2013-01-04 NOTE — Telephone Encounter (Signed)
Called pt, advised message from Dr. Lauenstein. Pt understood. 

## 2013-01-07 ENCOUNTER — Telehealth: Payer: Self-pay

## 2013-01-07 NOTE — Telephone Encounter (Signed)
Advised him, same as all our other previous conversations he should come in to discuss. He has appt Dec 19th. He agrees.

## 2013-01-07 NOTE — Telephone Encounter (Signed)
Pt is calling because he sees Dr L and he states that Dr L has stopped giving him his nerve pill and his hydrocodone and he doesn't understand why. He really needs his nerve pill back he states Call back number is [phone]

## 2013-01-12 ENCOUNTER — Telehealth: Payer: Self-pay

## 2013-01-12 NOTE — Telephone Encounter (Signed)
PT WOULD LIKE A CALL BACK REGARDING SOME MEDICATION. PLEASE CALL [phone]

## 2013-01-12 NOTE — Telephone Encounter (Signed)
Discussed again he needs office visit to discuss the medications, Amy

## 2013-02-02 ENCOUNTER — Other Ambulatory Visit: Payer: Self-pay | Admitting: Radiology

## 2013-02-02 MED ORDER — GLUCOSE BLOOD VI STRP: ORAL_STRIP | Status: DC

## 2013-02-10 ENCOUNTER — Ambulatory Visit: Payer: Medicare Other | Admitting: Family Medicine

## 2013-02-22 ENCOUNTER — Telehealth: Payer: Self-pay

## 2013-02-22 NOTE — Telephone Encounter (Signed)
Faxed req for order for TENS unit. Has been over 9 mos since pt seen. Faxed back w/note that pt needs OV and that pt will need to ask for TENS himself. Asked them not to re-fax req.

## 2013-03-10 ENCOUNTER — Ambulatory Visit: Payer: Medicare Other | Admitting: Family Medicine

## 2013-03-18 ENCOUNTER — Other Ambulatory Visit: Payer: Self-pay

## 2013-03-18 DIAGNOSIS — I10 Essential (primary) hypertension: Secondary | ICD-10-CM

## 2013-03-18 DIAGNOSIS — E785 Hyperlipidemia, unspecified: Secondary | ICD-10-CM

## 2013-03-18 DIAGNOSIS — J449 Chronic obstructive pulmonary disease, unspecified: Secondary | ICD-10-CM

## 2013-03-18 NOTE — Telephone Encounter (Signed)
[facility] reqs new Rxs for lotrel, pravastatin, and pro-air. mailorder needs 90 day supply and pt has not been seen since 04/2012. Pt has appt scheduled for 03/31/13. Dr L, do you want to OK 90 day RFs?

## 2013-03-21 MED ORDER — ALBUTEROL SULFATE HFA 108 (90 BASE) MCG/ACT IN AERS: 2.0000 | INHALATION_SPRAY | Freq: Four times a day (QID) | RESPIRATORY_TRACT | Status: DC | PRN

## 2013-03-21 MED ORDER — PRAVASTATIN SODIUM 40 MG PO TABS: 40.0000 mg | ORAL_TABLET | Freq: Every day | ORAL | Status: DC

## 2013-03-21 MED ORDER — AMLODIPINE BESY-BENAZEPRIL HCL 5-20 MG PO CAPS: 1.0000 | ORAL_CAPSULE | Freq: Every day | ORAL | Status: DC

## 2013-03-22 ENCOUNTER — Telehealth: Payer: Self-pay

## 2013-03-22 NOTE — Telephone Encounter (Signed)
[facility] faxed Medicare form for DM supplies. Completed and put in PA box.

## 2013-03-23 NOTE — Telephone Encounter (Signed)
Form faxed

## 2013-03-23 NOTE — Telephone Encounter (Signed)
Signed.

## 2013-03-29 ENCOUNTER — Telehealth: Payer: Self-pay

## 2013-03-29 NOTE — Telephone Encounter (Signed)
[facility] sent PA req for Lotrel. Pt stated that he gets his Rxs from [facility] now and that he hasn't had trouble getting his Lotrel yet. I sent back notice to [facility] that pt doesn't want meds from them. Pt asked that we have pharm fill w/name brand. Called pharm and they have and will fill w/name brand.

## 2013-03-31 ENCOUNTER — Ambulatory Visit: Payer: Medicare Other | Admitting: Family Medicine

## 2013-04-25 ENCOUNTER — Telehealth: Payer: Self-pay

## 2013-04-25 NOTE — Telephone Encounter (Signed)
Form completed and faxed to [facility] for Medicare DM supplies.

## 2013-05-09 ENCOUNTER — Telehealth: Payer: Self-pay

## 2013-05-09 NOTE — Telephone Encounter (Signed)
We called in generic Lotrel & he needs the brand name.  Please call  [phone]

## 2013-05-10 ENCOUNTER — Other Ambulatory Visit: Payer: Self-pay | Admitting: *Deleted

## 2013-05-10 DIAGNOSIS — I10 Essential (primary) hypertension: Secondary | ICD-10-CM

## 2013-05-10 MED ORDER — LOTREL 5-20 MG PO CAPS: 1.0000 | ORAL_CAPSULE | Freq: Every day | ORAL | Status: DC

## 2013-05-10 NOTE — Telephone Encounter (Signed)
Sent in rx for brand name Lotrel.  Pharmacy states that this requires prior auth.  Called ins and answered questions and they will contact us via fax about if medication is approved or not.

## 2013-05-11 ENCOUNTER — Telehealth: Payer: Self-pay

## 2013-05-11 NOTE — Telephone Encounter (Signed)
Patient is adding to the last message that the best phone number to call so he can get his mediation is [phone] and the fax is [phone]

## 2013-05-11 NOTE — Telephone Encounter (Signed)
Insurance approved brand name Lotrel for 31 day supply.  Pharmacy and pt notified.

## 2013-05-11 NOTE — Telephone Encounter (Signed)
Pt is stating that he is still waiting to have his Lotrel 5-20mg  called in, pt has 7 pills left and is becoming concerned. Best# [phone]

## 2013-05-12 ENCOUNTER — Ambulatory Visit (INDEPENDENT_AMBULATORY_CARE_PROVIDER_SITE_OTHER): Payer: Medicare Other | Admitting: Family Medicine

## 2013-05-12 ENCOUNTER — Encounter: Payer: Self-pay | Admitting: Family Medicine

## 2013-05-12 ENCOUNTER — Other Ambulatory Visit: Payer: Self-pay | Admitting: Family Medicine

## 2013-05-12 VITALS — BP 110/60 | HR 102 | Temp 98.2°F | Resp 16 | Wt 254.6 lb

## 2013-05-12 DIAGNOSIS — M549 Dorsalgia, unspecified: Secondary | ICD-10-CM

## 2013-05-12 DIAGNOSIS — F411 Generalized anxiety disorder: Secondary | ICD-10-CM

## 2013-05-12 DIAGNOSIS — E119 Type 2 diabetes mellitus without complications: Secondary | ICD-10-CM

## 2013-05-12 DIAGNOSIS — E1059 Type 1 diabetes mellitus with other circulatory complications: Secondary | ICD-10-CM

## 2013-05-12 DIAGNOSIS — E785 Hyperlipidemia, unspecified: Secondary | ICD-10-CM | POA: Diagnosis not present

## 2013-05-12 DIAGNOSIS — I10 Essential (primary) hypertension: Secondary | ICD-10-CM | POA: Diagnosis not present

## 2013-05-12 DIAGNOSIS — M545 Low back pain, unspecified: Secondary | ICD-10-CM

## 2013-05-12 DIAGNOSIS — J449 Chronic obstructive pulmonary disease, unspecified: Secondary | ICD-10-CM

## 2013-05-12 LAB — COMPLETE METABOLIC PANEL WITH GFR
ALT: 9 U/L (ref 0–53)
AST: 16 U/L (ref 0–37)
Albumin: 4.3 g/dL (ref 3.5–5.2)
Alkaline Phosphatase: 48 U/L (ref 39–117)
BUN: 9 mg/dL (ref 6–23)
CO2: 28 mEq/L (ref 19–32)
Calcium: 9.5 mg/dL (ref 8.4–10.5)
Chloride: 97 mEq/L (ref 96–112)
Creat: 1.13 mg/dL (ref 0.50–1.35)
GFR, Est African American: 75 mL/min
GFR, Est Non African American: 65 mL/min
Glucose, Bld: 129 mg/dL — ABNORMAL HIGH (ref 70–99)
Potassium: 4 mEq/L (ref 3.5–5.3)
Sodium: 134 mEq/L — ABNORMAL LOW (ref 135–145)
Total Bilirubin: 0.9 mg/dL (ref 0.2–1.2)
Total Protein: 6.6 g/dL (ref 6.0–8.3)

## 2013-05-12 LAB — POCT GLYCOSYLATED HEMOGLOBIN (HGB A1C): Hemoglobin A1C: 6.5

## 2013-05-12 MED ORDER — PRAVASTATIN SODIUM 40 MG PO TABS: 40.0000 mg | ORAL_TABLET | Freq: Every day | ORAL | Status: DC

## 2013-05-12 MED ORDER — ALBUTEROL SULFATE (2.5 MG/3ML) 0.083% IN NEBU: 2.5000 mg | INHALATION_SOLUTION | Freq: Three times a day (TID) | RESPIRATORY_TRACT | Status: DC

## 2013-05-12 MED ORDER — HYDROCODONE-ACETAMINOPHEN 7.5-500 MG PO TABS: 1.0000 | ORAL_TABLET | Freq: Three times a day (TID) | ORAL | Status: DC | PRN

## 2013-05-12 MED ORDER — GLUCOSE BLOOD VI STRP: ORAL_STRIP | Status: DC

## 2013-05-12 MED ORDER — CLONAZEPAM 1 MG PO TABS: 1.0000 mg | ORAL_TABLET | Freq: Two times a day (BID) | ORAL | Status: DC | PRN

## 2013-05-12 MED ORDER — GLUCOSE BLOOD VI STRP: 1.0000 | ORAL_STRIP | Status: DC | PRN

## 2013-05-12 MED ORDER — LOTREL 5-20 MG PO CAPS: 1.0000 | ORAL_CAPSULE | Freq: Every day | ORAL | Status: DC

## 2013-05-12 MED ORDER — METFORMIN HCL 1000 MG PO TABS: 500.0000 mg | ORAL_TABLET | Freq: Two times a day (BID) | ORAL | Status: DC

## 2013-05-12 MED ORDER — HYDROCODONE-ACETAMINOPHEN 7.5-325 MG PO TABS: 1.0000 | ORAL_TABLET | Freq: Three times a day (TID) | ORAL | Status: DC | PRN

## 2013-05-12 MED ORDER — ALBUTEROL SULFATE HFA 108 (90 BASE) MCG/ACT IN AERS
2.0000 | INHALATION_SPRAY | Freq: Four times a day (QID) | RESPIRATORY_TRACT | Status: DC | PRN
Start: 2013-05-12 — End: 2013-06-01

## 2013-05-12 NOTE — Progress Notes (Signed)
Is a [age] year old man with COPD, asthma, hypertension, anxiety, and chronic back pain. In addition patient has had diabetes controlled by oral medication.  Patient continues to get yearly eye checks. He has no new problems. He needs refills on his medicine.  Patient denies polyuria, headaches, chest pain, shortness of breath, abdominal pain, skin breaks on his feet or new rashes. He says that his pain is controlled with his chronic pain medicine.  Objective: Patient seen with her friend today, he is in no acute distress. HEENT: Unremarkable, with normal funduscopic Neck: Supple no adenopathy or bruits Chest: Clear Heart: Regular no murmur Abdomen: Soft nontender without HSM or masses. Skin: Intact with no new rashes or suspicious lesions Extremities: No edema, gait is stable  DIABETES MELLITUS, TYPE II - Plan: POCT glycosylated hemoglobin (Hb A1C), COMPLETE METABOLIC PANEL WITH GFR, glucose blood (ONE TOUCH ULTRA TEST) test strip  Type I (juvenile type) diabetes mellitus with peripheral circulatory disorders, not stated as uncontrolled - Plan: metFORMIN (GLUCOPHAGE) 1000 MG tablet  Other and unspecified hyperlipidemia - Plan: pravastatin (PRAVACHOL) 40 MG tablet  Essential hypertension, benign - Plan: LOTREL 5-20 MG per capsule  Low back pain - Plan: HYDROcodone-acetaminophen (LORTAB) 7.5-500 MG per tablet  Back pain - Plan: HYDROcodone-acetaminophen (LORTAB) 7.5-500 MG per tablet  COPD (chronic obstructive pulmonary disease) - Plan: albuterol (PROVENTIL) (2.5 MG/3ML) 0.083% nebulizer solution, albuterol (PROAIR HFA) 108 (90 BASE) MCG/ACT inhaler  Anxiety state, unspecified - Plan: clonazePAM (KLONOPIN) 1 MG tablet  Signed, Elvina SidleKurt Jayliani Wanner, MD

## 2013-05-19 ENCOUNTER — Telehealth: Payer: Self-pay

## 2013-05-19 NOTE — Telephone Encounter (Signed)
[facility] Medicare form received for pt's albuterol neb med. Completed and put in Dr Cain SaupeL's box for signature.

## 2013-05-24 ENCOUNTER — Other Ambulatory Visit: Payer: Self-pay | Admitting: Family Medicine

## 2013-05-25 NOTE — Telephone Encounter (Signed)
Checked w/Dr L who reported he has done this.

## 2013-05-30 ENCOUNTER — Telehealth: Payer: Self-pay

## 2013-05-30 DIAGNOSIS — E119 Type 2 diabetes mellitus without complications: Secondary | ICD-10-CM

## 2013-05-30 MED ORDER — GLUCOSE BLOOD VI STRP: ORAL_STRIP | Status: DC

## 2013-05-30 NOTE — Telephone Encounter (Signed)
Pt had called stating that [facility] had not gotten the Rx we sent for testing strips. I re-faxed Rx.

## 2013-06-01 ENCOUNTER — Other Ambulatory Visit: Payer: Self-pay

## 2013-06-01 DIAGNOSIS — E785 Hyperlipidemia, unspecified: Secondary | ICD-10-CM

## 2013-06-01 DIAGNOSIS — J449 Chronic obstructive pulmonary disease, unspecified: Secondary | ICD-10-CM

## 2013-06-01 MED ORDER — PRAVASTATIN SODIUM 40 MG PO TABS: 40.0000 mg | ORAL_TABLET | Freq: Every day | ORAL | Status: DC

## 2013-06-01 MED ORDER — ALBUTEROL SULFATE HFA 108 (90 BASE) MCG/ACT IN AERS: 2.0000 | INHALATION_SPRAY | Freq: Four times a day (QID) | RESPIRATORY_TRACT | Status: DC | PRN

## 2013-06-06 ENCOUNTER — Other Ambulatory Visit: Payer: Self-pay

## 2013-06-06 DIAGNOSIS — M549 Dorsalgia, unspecified: Secondary | ICD-10-CM

## 2013-06-06 MED ORDER — HYDROCODONE-ACETAMINOPHEN 7.5-325 MG PO TABS: 1.0000 | ORAL_TABLET | Freq: Three times a day (TID) | ORAL | Status: DC | PRN

## 2013-06-06 NOTE — Telephone Encounter (Signed)
Pt called and requested a RF on his hydrocodone. It is not due until weekend, but would like us to mail Rx to him d/t difficulty getting transportation and knows it may take several days. Requests that we put weekend's fill date on it so that Rx will get to him in time. Last RFd 05/12/13. Dr L, please advise. Pended.

## 2013-06-07 NOTE — Telephone Encounter (Signed)
Mailed Rx and notified pt on VM done.

## 2013-06-08 ENCOUNTER — Telehealth: Payer: Self-pay

## 2013-06-08 NOTE — Telephone Encounter (Signed)
Patient called requesting to see if he had a pneumonia shot. Patient think he got it last year some time. Please call patient [phone]

## 2013-06-08 NOTE — Telephone Encounter (Signed)
Advised pt that we do not have documentation of his flu or pneumonia shot in our office. Pt thinks he may have had it at a pharmacy last year.

## 2013-06-10 ENCOUNTER — Telehealth: Payer: Self-pay

## 2013-06-10 NOTE — Telephone Encounter (Signed)
Patient states that his hydrocodone RX was mailed to him 3 days ago and he hasn't received it yet.  [id number]

## 2013-06-10 NOTE — Telephone Encounter (Signed)
Spoke to patient advised that he wait a couple more days for RX to reach him.  He said he will do so.

## 2013-06-13 ENCOUNTER — Other Ambulatory Visit: Payer: Self-pay

## 2013-06-13 DIAGNOSIS — J449 Chronic obstructive pulmonary disease, unspecified: Secondary | ICD-10-CM

## 2013-06-13 NOTE — Telephone Encounter (Signed)
Received request for refill. 

## 2013-06-14 MED ORDER — ALBUTEROL SULFATE HFA 108 (90 BASE) MCG/ACT IN AERS: 2.0000 | INHALATION_SPRAY | Freq: Four times a day (QID) | RESPIRATORY_TRACT | Status: DC | PRN

## 2013-06-22 ENCOUNTER — Telehealth: Payer: Self-pay

## 2013-06-22 DIAGNOSIS — I10 Essential (primary) hypertension: Secondary | ICD-10-CM

## 2013-06-22 MED ORDER — LOTREL 5-20 MG PO CAPS: 1.0000 | ORAL_CAPSULE | Freq: Every day | ORAL | Status: DC

## 2013-06-22 NOTE — Telephone Encounter (Signed)
Pt called back and stated that he changed ins companies back to [employer] and they advised him that they will cover a 90 day supply of Lotrel if we resend for 90 day to pharm. [facility] [address]

## 2013-06-22 NOTE — Telephone Encounter (Signed)
Pt called and stated that he accidentally dropped the remaining of his Lotrel Rx into the commode and will not get another shipment/RF from mail order until the end of May. Advised pt that I will send in 1 mos RF to local pharm and pharm may be able to call help desk and try to have ins cover it. Asked him to have pharm call if he needs us to send in something different d/t cost.

## 2013-06-22 NOTE — Addendum Note (Signed)
Addended by: Sheppard PlumberBRIGGS, Serah Nicoletti A on: 06/22/2013 04:27 PM   Modules accepted: Orders

## 2013-06-23 ENCOUNTER — Telehealth: Payer: Self-pay

## 2013-06-23 NOTE — Telephone Encounter (Signed)
Dr L, received order req for compounded cream for DM neuropathy from [facility] and DM vit. Pt verified that he would like to try the cream if Dr L thinks it would be good for him to try, but he does not want the vitamin. I put form in Dr Cain SaupeL's box.

## 2013-06-23 NOTE — Telephone Encounter (Signed)
Darin ClontsKaren Gonzalez. From [facility] called and left message in regards to the MR request she faxed. States she has not received them yet. April, I found the release you did in Epic but could not see whether or not you mailed these or faxed these records to her. Please call Darin BraunKaren and let her know. Thank you. CB #: [id number].

## 2013-06-24 ENCOUNTER — Other Ambulatory Visit: Payer: Self-pay | Admitting: Family Medicine

## 2013-06-24 ENCOUNTER — Telehealth: Payer: Self-pay

## 2013-06-24 DIAGNOSIS — Z23 Encounter for immunization: Secondary | ICD-10-CM | POA: Diagnosis not present

## 2013-06-24 NOTE — Telephone Encounter (Signed)
Tried to pull chart but it was not filed back.

## 2013-06-24 NOTE — Telephone Encounter (Signed)
NO record of pneumovax in epic will have to pull patient's chart and review.  [id number]

## 2013-06-24 NOTE — Telephone Encounter (Signed)
Patient is calling to see if he has had a pneumonia shot in the past.   [phone]

## 2013-06-27 ENCOUNTER — Other Ambulatory Visit: Payer: Self-pay | Admitting: Physician Assistant

## 2013-06-27 NOTE — Telephone Encounter (Signed)
Medical records found chart. Reviewed patient's chart. Had pneumovax in 2010. Spoke to patient advised he had pneumovax in 2010.  He said he had it done again at the drugstore yesterday and wanted to know if this would hurt him.  I told him it would not.

## 2013-07-04 ENCOUNTER — Other Ambulatory Visit: Payer: Self-pay

## 2013-07-04 DIAGNOSIS — M549 Dorsalgia, unspecified: Secondary | ICD-10-CM

## 2013-07-04 MED ORDER — HYDROCODONE-ACETAMINOPHEN 7.5-325 MG PO TABS: 1.0000 | ORAL_TABLET | Freq: Three times a day (TID) | ORAL | Status: DC | PRN

## 2013-07-04 NOTE — Telephone Encounter (Signed)
Pt LM on VM asking for a RF of hydrocodone to be mailed to him so that he can get it filled next Monday when he will run out. Pt has appt scheduled for 08/15/13 at 10:30 (pt had asked me to verify date/time of appt). Pended Rx.

## 2013-07-05 MED ORDER — HYDROCODONE-ACETAMINOPHEN 7.5-325 MG PO TABS: 1.0000 | ORAL_TABLET | Freq: Three times a day (TID) | ORAL | Status: DC | PRN

## 2013-07-05 NOTE — Telephone Encounter (Signed)
Rx reprinted per Dr Cain SaupeL's OK because the one he approved from home did not print/was never signed. Signed new Rx and mailed. Confirmed appt time w/pt.

## 2013-07-05 NOTE — Addendum Note (Signed)
Addended by: Sheppard PlumberBRIGGS, Annastyn Silvey A on: 07/05/2013 11:02 AM   Modules accepted: Orders

## 2013-07-12 ENCOUNTER — Telehealth: Payer: Self-pay | Admitting: Family Medicine

## 2013-07-12 NOTE — Telephone Encounter (Signed)
Looks like we mailed it to him on 5/12 or 5/13. He has not received it yet. He has 2 days left of Hydrocodone. Advised to call back tomorrow if he doesn't get it in the mail and I would see if we could print it for him to come get

## 2013-07-12 NOTE — Telephone Encounter (Signed)
Patient states that his medications were going to be mailed to him. Did not specify whether they were coming from our office or through the mail order system. Please call patient  [phone]

## 2013-07-31 ENCOUNTER — Other Ambulatory Visit: Payer: Self-pay | Admitting: Family Medicine

## 2013-08-02 ENCOUNTER — Other Ambulatory Visit: Payer: Self-pay

## 2013-08-02 DIAGNOSIS — J449 Chronic obstructive pulmonary disease, unspecified: Secondary | ICD-10-CM

## 2013-08-02 MED ORDER — ALBUTEROL SULFATE HFA 108 (90 BASE) MCG/ACT IN AERS: 2.0000 | INHALATION_SPRAY | Freq: Four times a day (QID) | RESPIRATORY_TRACT | Status: DC | PRN

## 2013-08-08 ENCOUNTER — Other Ambulatory Visit: Payer: Self-pay | Admitting: Family Medicine

## 2013-08-15 ENCOUNTER — Ambulatory Visit (INDEPENDENT_AMBULATORY_CARE_PROVIDER_SITE_OTHER): Payer: Medicare Other | Admitting: Family Medicine

## 2013-08-15 ENCOUNTER — Encounter: Payer: Self-pay | Admitting: Family Medicine

## 2013-08-15 VITALS — BP 116/70 | HR 100 | Temp 98.2°F | Resp 18 | Ht 65.5 in | Wt 261.6 lb

## 2013-08-15 DIAGNOSIS — Z125 Encounter for screening for malignant neoplasm of prostate: Secondary | ICD-10-CM | POA: Diagnosis not present

## 2013-08-15 DIAGNOSIS — M1710 Unilateral primary osteoarthritis, unspecified knee: Secondary | ICD-10-CM

## 2013-08-15 DIAGNOSIS — F411 Generalized anxiety disorder: Secondary | ICD-10-CM

## 2013-08-15 DIAGNOSIS — J410 Simple chronic bronchitis: Secondary | ICD-10-CM | POA: Diagnosis not present

## 2013-08-15 DIAGNOSIS — E119 Type 2 diabetes mellitus without complications: Secondary | ICD-10-CM

## 2013-08-15 DIAGNOSIS — I10 Essential (primary) hypertension: Secondary | ICD-10-CM

## 2013-08-15 DIAGNOSIS — M1712 Unilateral primary osteoarthritis, left knee: Secondary | ICD-10-CM

## 2013-08-15 DIAGNOSIS — E1059 Type 1 diabetes mellitus with other circulatory complications: Secondary | ICD-10-CM

## 2013-08-15 LAB — POCT UA - MICROSCOPIC ONLY
Casts, Ur, LPF, POC: NEGATIVE
Crystals, Ur, HPF, POC: NEGATIVE
Epithelial cells, urine per micros: NEGATIVE
Mucus, UA: NEGATIVE
Yeast, UA: NEGATIVE

## 2013-08-15 LAB — COMPLETE METABOLIC PANEL WITH GFR
ALT: <8 U/L (ref 0–53)
AST: 12 U/L (ref 0–37)
Albumin: 4.4 g/dL (ref 3.5–5.2)
Alkaline Phosphatase: 51 U/L (ref 39–117)
BUN: 6 mg/dL (ref 6–23)
CO2: 26 mEq/L (ref 19–32)
Calcium: 8.5 mg/dL (ref 8.4–10.5)
Chloride: 98 mEq/L (ref 96–112)
Creat: 1.09 mg/dL (ref 0.50–1.35)
GFR, Est African American: 77 mL/min
GFR, Est Non African American: 67 mL/min
Glucose, Bld: 159 mg/dL — ABNORMAL HIGH (ref 70–99)
Potassium: 4.1 mEq/L (ref 3.5–5.3)
Sodium: 130 mEq/L — ABNORMAL LOW (ref 135–145)
Total Bilirubin: 0.7 mg/dL (ref 0.2–1.2)
Total Protein: 7.1 g/dL (ref 6.0–8.3)

## 2013-08-15 LAB — LIPID PANEL
Cholesterol: 147 mg/dL (ref 0–200)
HDL: 45 mg/dL (ref >39)
LDL Cholesterol: 63 mg/dL (ref 0–99)
Total CHOL/HDL Ratio: 3.3 Ratio
Triglycerides: 195 mg/dL — ABNORMAL HIGH (ref <150)
VLDL: 39 mg/dL (ref 0–40)

## 2013-08-15 LAB — POCT URINALYSIS DIPSTICK
Bilirubin, UA: NEGATIVE
Blood, UA: NEGATIVE
Glucose, UA: 250
Ketones, UA: NEGATIVE
Nitrite, UA: NEGATIVE
Protein, UA: NEGATIVE
Spec Grav, UA: <=1.005
Urobilinogen, UA: 0.2
pH, UA: 6.5

## 2013-08-15 LAB — POCT GLYCOSYLATED HEMOGLOBIN (HGB A1C): Hemoglobin A1C: 7

## 2013-08-15 MED ORDER — ONETOUCH ULTRA SYSTEM W/DEVICE KIT: 1.0000 | PACK | Freq: Three times a day (TID) | Status: DC

## 2013-08-15 MED ORDER — ALBUTEROL SULFATE HFA 108 (90 BASE) MCG/ACT IN AERS: 2.0000 | INHALATION_SPRAY | Freq: Four times a day (QID) | RESPIRATORY_TRACT | Status: DC | PRN

## 2013-08-15 MED ORDER — METFORMIN HCL 1000 MG PO TABS: 500.0000 mg | ORAL_TABLET | Freq: Two times a day (BID) | ORAL | Status: DC

## 2013-08-15 MED ORDER — HYDROCODONE-ACETAMINOPHEN 7.5-325 MG PO TABS: 1.0000 | ORAL_TABLET | Freq: Three times a day (TID) | ORAL | Status: DC | PRN

## 2013-08-15 MED ORDER — CLONAZEPAM 1 MG PO TABS: 1.0000 mg | ORAL_TABLET | Freq: Two times a day (BID) | ORAL | Status: DC | PRN

## 2013-08-15 NOTE — Progress Notes (Signed)
Patient ID: Darin Gonzalez MRN: [id number], DOB: 1941-02-06, 73 y.o. Date of Encounter: 08/15/2013, 11:47 AM  Primary Physician: [name], MD  Chief Complaint: Diabetes follow up  HPI: 73 y.o. year old male with history below presents for follow up of diabetes mellitus. Doing well. No issues or complaints. Taking medications daily without adverse effects. No polydipsia, polyphagia, polyuria, or nocturia.  Blood sugars at home: 150-240, generally < 200 Unable to exercise regularly because of left knee Last A1C:  5.6  Eye MD: yearly OYD:XAJOINOMVE I No past medical history on file.   Home Meds: Prior to Admission medications   Medication Sig Start Date End Date Taking? Authorizing Provider  albuterol (PROAIR HFA) 108 (90 BASE) MCG/ACT inhaler Inhale 2 puffs into the lungs every 6 (six) hours as needed. 08/15/13  Yes [name], MD  albuterol (PROVENTIL) (2.5 MG/3ML) 0.083% nebulizer solution Take 3 mLs (2.5 mg total) by nebulization every 8 (eight) hours. 05/12/13  Yes [name], MD  Blood Glucose Monitoring Suppl (ONE TOUCH ULTRA SYSTEM KIT) W/DEVICE KIT 1 kit by Does not apply route 3 (three) times daily. 08/15/13  Yes [name], MD  clonazePAM (KLONOPIN) 1 MG tablet Take 1 tablet (1 mg total) by mouth 2 (two) times daily as needed for anxiety. 08/15/13  Yes [name], MD  esomeprazole (NEXIUM) 40 MG capsule TAKE 1 CAPSULE BY MOUTH EVERY DAY BEFORE BREAKFAST   Yes Heather M Marte, PA-C  fluticasone (FLONASE) 50 MCG/ACT nasal spray Place 1 spray into the nose daily. 05/20/12  Yes [name], MD  glucose blood (ONE TOUCH ULTRA TEST) test strip Use to test blood sugar three times daily due to fluctuating blood sugars. Dx code 250.00. 05/30/13  Yes Eleanore Kurtis Bushman, PA-C  HYDROcodone-acetaminophen (NORCO) 7.5-325 MG per tablet Take 1 tablet by mouth 3 (three) times daily as needed for moderate pain. MAY FILL ON/AFTER 07/11/13. 08/15/13  Yes [name], MD    ibuprofen (ADVIL,MOTRIN) 800 MG tablet Take 1 tablet (800 mg total) by mouth every 8 (eight) hours as needed for pain. PATIENT NEEDS OFFICE VISIT FOR ADDITIONAL REFILLS 11/22/12  Yes [name], MD  LOTREL 5-20 MG per capsule TAKE 1 CAPSULE BY MOUTH EVERY DAY   Yes Chelle S Jeffery, PA-C  LOTREL 5-20 MG per capsule TAKE ONE CAPSULE BY MOUTH EVERY DAY.   Yes [name], MD  metFORMIN (GLUCOPHAGE) 1000 MG tablet Take 0.5 tablets (500 mg total) by mouth 2 (two) times daily with a meal. 08/15/13  Yes [name], MD  pravastatin (PRAVACHOL) 40 MG tablet TAKE 1 TABLET BY MOUTH EVERY DAY 06/24/13  Yes [name], MD    Allergies:  Allergies  Allergen Reactions  . Codeine   . Sulfonamide Derivatives     History   Social History  . Marital Status: Legally Separated    Spouse Name: N/A    Number of Children: N/A  . Years of Education: N/A   Occupational History  . Not on file.   Social History Main Topics  . Smoking status: Former Smoker -- 2.00 packs/day for 20 years    Types: Cigarettes    Quit date: 05/02/1979  . Smokeless tobacco: Current User    Types: Chew  . Alcohol Use: Not on file  . Drug Use: Not on file  . Sexual Activity: Not on file   Other Topics Concern  . Not on file   Social History Narrative  . No narrative on file     Lab  Results  Component Value Date   HGBA1C 6.5 05/12/2013    Review of Systems: Constitutional: negative for chills, fever, night sweats, weight changes, or fatigue  HEENT: negative for vision changes, hearing loss, congestion, rhinorrhea, or epistaxis Cardiovascular: negative for chest pain, palpitations, diaphoresis, DOE, orthopnea, or edema Respiratory: negative for hemoptysis, wheezing, shortness of breath, dyspnea, or cough Abdominal: negative for abdominal pain, nausea, vomiting, diarrhea, or constipation Dermatological: negative for rash, erythema, or wounds Neurologic: negative for headache, dizziness, or  syncope Renal:  Negative for polyuria, polydipsia, or dysuria All other systems reviewed and are otherwise negative with the exception to those above and in the HPI.   Physical Exam: Blood pressure 116/70, pulse 100, temperature 98.2 F (36.8 C), temperature source Oral, resp. rate 18, height 5' 5.5" (1.664 m), weight 261 lb 9.6 oz (118.661 kg), SpO2 95.00%., Body mass index is 42.85 kg/(m^2). Wt Readings from Last 3 Encounters:  08/15/13 261 lb 9.6 oz (118.661 kg)  05/12/13 254 lb 9.6 oz (115.486 kg)  05/20/12 256 lb (116.121 kg)   BP Readings from Last 3 Encounters:  08/15/13 116/70  05/12/13 110/60  05/20/12 110/64   General: Well developed, well nourished, in no acute distress. Obese, cheerful Head: Normocephalic, atraumatic, eyes without discharge, sclera non-icteric, nares are without discharge. Bilateral auditory canals clear, TM's are without perforation, pearly grey and translucent with reflective cone of light bilaterally. Oral cavity moist, posterior pharynx without exudate, erythema, peritonsillar abscess, or post nasal drip.  Edentulous  Neck: Supple. No thyromegaly. Full ROM. No lymphadenopathy. Lungs: Clear bilaterally to auscultation without wheezes, rales, or rhonchi. Breathing is unlabored. Heart: RRR with S1 S2. No murmurs, rubs, or gallops appreciated. Abdomen: Soft, non-tender, non-distended with normoactive bowel sounds. No hepatosplenomegaly. No rebound/guarding. No obvious abdominal masses. Msk:  Strength and tone normal for age. Extremities/Skin: Warm and dry. No clubbing or cyanosis. No edema. No rashes, wounds, or suspicious lesions. Monofilament exam okay.  No bony abnormality of left knee with good ROM, mild swelling in popliteal area.   Missing index finger left hand Neuro: Alert and oriented X 3. Moves all extremities spontaneously. Gait is normal. CNII-XII grossly in tact. Psych:  Responds to questions appropriately with a normal affect.   Labs: Lab  Results  Component Value Date   HGBA1C 6.5 05/12/2013      ASSESSMENT AND PLAN:  73 y.o. year old male with DIABETES MELLITUS, TYPE II - Plan: POCT UA - Microscopic Only, POCT urinalysis dipstick, POCT glycosylated hemoglobin (Hb A1C), Blood Glucose Monitoring Suppl (ONE TOUCH ULTRA SYSTEM KIT) W/DEVICE KIT, metFORMIN (GLUCOPHAGE) 1000 MG tablet  HYPERTENSION - Plan: POCT UA - Microscopic Only, POCT urinalysis dipstick, COMPLETE METABOLIC PANEL WITH GFR, Lipid panel  Prostate cancer screening - Plan: PSA, Medicare  Simple chronic bronchitis - Plan: albuterol (PROAIR HFA) 108 (90 BASE) MCG/ACT inhaler  Type I (juvenile type) diabetes mellitus with peripheral circulatory disorders, not stated as uncontrolled(250.71)  Anxiety state, unspecified - Plan: clonazePAM (KLONOPIN) 1 MG tablet  Primary osteoarthritis of left knee - Plan: HYDROcodone-acetaminophen (NORCO) 7.5-325 MG per tablet  Signed, [name], MD   -  Signed, [name], MD 08/15/2013 11:47 AM

## 2013-08-16 LAB — PSA, MEDICARE: PSA: 2.47 ng/mL (ref <=4.00)

## 2013-08-24 ENCOUNTER — Telehealth: Payer: Self-pay

## 2013-08-24 NOTE — Telephone Encounter (Signed)
PA req sent for One Touch strips, and called to get clarification as to what is needed. Pharm stated that a new Medicare form needs to be filled out before they will cover the strips. They are faxing form. Completed [facility] F[facility]ed [facility] req to local [facility] to let them know it has been done.

## 2013-08-25 NOTE — Telephone Encounter (Signed)
Found our copy of form to be scanned and [facility]ed it [facility].

## 2013-08-25 NOTE — Telephone Encounter (Signed)
Day[facility], states that he faxed a request for the One Touch strips to our office. [facility] however pharmacist says that the Medicare Department never received our fax. He has asked that we fax the script to him and he will call the Medicare Department. Fax # [phone] Phone #: [phone]

## 2013-09-05 ENCOUNTER — Telehealth: Payer: Self-pay

## 2013-09-05 DIAGNOSIS — M1712 Unilateral primary osteoarthritis, left knee: Secondary | ICD-10-CM

## 2013-09-05 NOTE — Telephone Encounter (Signed)
Last rx given to pt on 08/15/13. Too early for RF??

## 2013-09-05 NOTE — Telephone Encounter (Signed)
Patient needs Rx refill for hydrocodone, he says Dr L was going to mail it to him, but he has not received it yet.

## 2013-09-06 MED ORDER — HYDROCODONE-ACETAMINOPHEN 7.5-325 MG PO TABS: 1.0000 | ORAL_TABLET | Freq: Three times a day (TID) | ORAL | Status: DC | PRN

## 2013-09-06 NOTE — Telephone Encounter (Signed)
Pt advised in pick up drawer. 

## 2013-09-06 NOTE — Telephone Encounter (Signed)
Meds ordered this encounter  Medications  . HYDROcodone-acetaminophen (NORCO) 7.5-325 MG per tablet    Sig: Take 1 tablet by mouth 3 (three) times daily as needed for moderate pain. MAY FILL ON/AFTER 09/14/13.    Dispense:  100 tablet    Refill:  0    Order Specific Question:  Supervising Provider    Answer:  DOOLITTLE, ROBERT P [[id number]]   Printed at 104.  Will bring to 102 after clinic.

## 2013-09-06 NOTE — Telephone Encounter (Signed)
Refill on hydrocodone. He is aware he wont be able to fill this until 09/14/13.   He is also aware that we can not mail his prescription in the mail. He states he will come by and pick this up after contacted stating rx ready for p/u.  Please advise.

## 2013-09-06 NOTE — Telephone Encounter (Signed)
Patient called to check status of rx refill request, he is requesting it because last time we gave it to him two days late after he ran out of his medication. Please advise patient.

## 2013-09-13 ENCOUNTER — Telehealth: Payer: Self-pay

## 2013-09-13 NOTE — Telephone Encounter (Signed)
[facility] faxed DM supplies form. Completed and faxed/scanned.

## 2013-09-16 ENCOUNTER — Telehealth: Payer: Self-pay

## 2013-09-16 NOTE — Telephone Encounter (Signed)
[facility] sent DM supply form. I called pt bc I get these forms for him from multi companies. He stated that he is just getting his supplies [facility] the [facility] and we may deny the other companies. Faxed back denial.

## 2013-09-20 ENCOUNTER — Telehealth: Payer: Self-pay

## 2013-09-20 NOTE — Telephone Encounter (Signed)
[facility] Medicare dept faxed form for albuterol to be signed by Dr L. I have put in his box.

## 2013-10-06 ENCOUNTER — Telehealth: Payer: Self-pay

## 2013-10-06 NOTE — Telephone Encounter (Signed)
Pt called and he reported that he accidentally threw out his Lotrel bottle when cleaning up. Ins will not pay for it again for over a month. Pt reqs that we send in 2 sep Rxs for amlodipine 5 mg #60 and benazepril 20 #60 to hold [facility]. Pended.

## 2013-10-07 MED ORDER — BENAZEPRIL HCL 20 MG PO TABS: 20.0000 mg | ORAL_TABLET | Freq: Every day | ORAL | Status: DC

## 2013-10-07 MED ORDER — AMLODIPINE BESYLATE 5 MG PO TABS: 5.0000 mg | ORAL_TABLET | Freq: Every day | ORAL | Status: DC

## 2013-10-07 NOTE — Telephone Encounter (Signed)
Meds ordered this encounter  Medications  . amLODipine (NORVASC) 5 MG tablet    Sig: Take 1 tablet (5 mg total) by mouth daily.    Dispense:  60 tablet    Refill:  0  . benazepril (LOTENSIN) 20 MG tablet    Sig: Take 1 tablet (20 mg total) by mouth daily.    Dispense:  60 tablet    Refill:  0

## 2013-10-07 NOTE — Telephone Encounter (Signed)
Pt advised.

## 2013-10-11 ENCOUNTER — Other Ambulatory Visit: Payer: Self-pay

## 2013-10-11 DIAGNOSIS — M1712 Unilateral primary osteoarthritis, left knee: Secondary | ICD-10-CM

## 2013-10-11 NOTE — Telephone Encounter (Signed)
Pt called to req RF of hydrocodone which would be due to be filled this weekend.

## 2013-10-12 MED ORDER — HYDROCODONE-ACETAMINOPHEN 7.5-325 MG PO TABS: 1.0000 | ORAL_TABLET | Freq: Three times a day (TID) | ORAL | Status: DC | PRN

## 2013-10-13 ENCOUNTER — Telehealth: Payer: Self-pay

## 2013-10-13 NOTE — Telephone Encounter (Signed)
Notified pt ready. 

## 2013-10-13 NOTE — Telephone Encounter (Signed)
notified

## 2013-10-13 NOTE — Telephone Encounter (Signed)
Called pt to inform his Rx for hydrocodone is ready.

## 2013-11-07 ENCOUNTER — Telehealth: Payer: Self-pay

## 2013-11-07 DIAGNOSIS — E119 Type 2 diabetes mellitus without complications: Secondary | ICD-10-CM

## 2013-11-07 MED ORDER — GLUCOSE BLOOD VI STRP: ORAL_STRIP | Status: DC

## 2013-11-07 NOTE — Telephone Encounter (Signed)
Pt LM on VM asking for RFs of Lotrel and test strips. Called pt and clarified that he does not need a RF of Lotrel bf his OV on 9/21. [facility]. Done and faxed.

## 2013-11-14 ENCOUNTER — Ambulatory Visit (INDEPENDENT_AMBULATORY_CARE_PROVIDER_SITE_OTHER): Payer: Medicare Other | Admitting: Family Medicine

## 2013-11-14 ENCOUNTER — Encounter: Payer: Self-pay | Admitting: Family Medicine

## 2013-11-14 VITALS — BP 132/70 | HR 110 | Resp 16 | Ht 65.5 in | Wt 263.0 lb

## 2013-11-14 DIAGNOSIS — M1710 Unilateral primary osteoarthritis, unspecified knee: Secondary | ICD-10-CM

## 2013-11-14 DIAGNOSIS — Z23 Encounter for immunization: Secondary | ICD-10-CM | POA: Diagnosis not present

## 2013-11-14 DIAGNOSIS — F411 Generalized anxiety disorder: Secondary | ICD-10-CM | POA: Diagnosis not present

## 2013-11-14 DIAGNOSIS — M1712 Unilateral primary osteoarthritis, left knee: Secondary | ICD-10-CM

## 2013-11-14 DIAGNOSIS — E119 Type 2 diabetes mellitus without complications: Secondary | ICD-10-CM

## 2013-11-14 LAB — COMPREHENSIVE METABOLIC PANEL
ALT: 10 U/L (ref 0–53)
AST: 17 U/L (ref 0–37)
Albumin: 4.2 g/dL (ref 3.5–5.2)
Alkaline Phosphatase: 53 U/L (ref 39–117)
BUN: 5 mg/dL — ABNORMAL LOW (ref 6–23)
CO2: 29 mEq/L (ref 19–32)
Calcium: 8.9 mg/dL (ref 8.4–10.5)
Chloride: 100 mEq/L (ref 96–112)
Creat: 1.15 mg/dL (ref 0.50–1.35)
Glucose, Bld: 190 mg/dL — ABNORMAL HIGH (ref 70–99)
Potassium: 4 mEq/L (ref 3.5–5.3)
Sodium: 137 mEq/L (ref 135–145)
Total Bilirubin: 0.7 mg/dL (ref 0.2–1.2)
Total Protein: 6.6 g/dL (ref 6.0–8.3)

## 2013-11-14 LAB — HEMOGLOBIN A1C
Hgb A1c MFr Bld: 7.6 % — ABNORMAL HIGH (ref <5.7)
Mean Plasma Glucose: 171 mg/dL — ABNORMAL HIGH (ref <117)

## 2013-11-14 MED ORDER — HYDROCODONE-ACETAMINOPHEN 7.5-325 MG PO TABS: 1.0000 | ORAL_TABLET | Freq: Three times a day (TID) | ORAL | Status: DC | PRN

## 2013-11-14 MED ORDER — CLONAZEPAM 1 MG PO TABS: 1.0000 mg | ORAL_TABLET | Freq: Two times a day (BID) | ORAL | Status: DC | PRN

## 2013-11-14 MED ORDER — HYDROCODONE-ACETAMINOPHEN 7.5-325 MG PO TABS
1.0000 | ORAL_TABLET | Freq: Three times a day (TID) | ORAL | Status: DC | PRN
Start: 2013-11-14 — End: 2014-02-02

## 2013-11-14 NOTE — Progress Notes (Signed)
'@[email or web address]' @  Patient ID: Darin Gonzalez MRN: [id number], DOB: 02-15-1941, 73 y.o. Date of Encounter: 11/14/2013, 9:37 AM  Primary Physician: [name], MD  This chart was scribed for [name], MD by Rayfield Citizen, medical scribe. This patient was seen in room 27 and the patient's care was started at 9:37 AM.    Chief Complaint:   HPI: 73 y.o. year old male with history below presents with  Patient denies any new pain; he notes chronic myalgias and arthralgias in his legs and particularly his left hip.  He notes that he is due for an eye exam with Dr. Katy Fitch   Both he and his son would like medication refills 05/02/61; Standley Brooking; Percocet 10     No past medical history on file.   Home Meds: Prior to Admission medications   Medication Sig Start Date End Date Taking? Authorizing Provider  albuterol (PROAIR HFA) 108 (90 BASE) MCG/ACT inhaler Inhale 2 puffs into the lungs every 6 (six) hours as needed. 08/15/13  Yes [name], MD  albuterol (PROVENTIL) (2.5 MG/3ML) 0.083% nebulizer solution Take 3 mLs (2.5 mg total) by nebulization every 8 (eight) hours. 05/12/13  Yes [name], MD  amLODipine (NORVASC) 5 MG tablet Take 1 tablet (5 mg total) by mouth daily. 10/07/13  Yes Chelle S Jeffery, PA-C  benazepril (LOTENSIN) 20 MG tablet Take 1 tablet (20 mg total) by mouth daily. 10/07/13  Yes Chelle S Jeffery, PA-C  Blood Glucose Monitoring Suppl (ONE TOUCH ULTRA SYSTEM KIT) W/DEVICE KIT 1 kit by Does not apply route 3 (three) times daily. 08/15/13  Yes [name], MD  clonazePAM (KLONOPIN) 1 MG tablet Take 1 tablet (1 mg total) by mouth 2 (two) times daily as needed for anxiety. 08/15/13  Yes [name], MD  esomeprazole (NEXIUM) 40 MG capsule TAKE 1 CAPSULE BY MOUTH EVERY DAY BEFORE BREAKFAST   Yes Heather M Marte, PA-C  fluticasone (FLONASE) 50 MCG/ACT nasal spray Place 1 spray into the nose daily. 05/20/12  Yes [name], MD  glucose blood (ONE TOUCH ULTRA  TEST) test strip Use to test blood sugar three times daily due to fluctuating blood sugars. Dx code 250.00. 11/07/13  Yes Chelle S Jeffery, PA-C  HYDROcodone-acetaminophen (NORCO) 7.5-325 MG per tablet Take 1 tablet by mouth 3 (three) times daily as needed for moderate pain. MAY FILL ON/AFTER 10/14/13. 10/12/13  Yes [name], MD  ibuprofen (ADVIL,MOTRIN) 800 MG tablet Take 1 tablet (800 mg total) by mouth every 8 (eight) hours as needed for pain. PATIENT NEEDS OFFICE VISIT FOR ADDITIONAL REFILLS 11/22/12  Yes [name], MD  LOTREL 5-20 MG per capsule TAKE 1 CAPSULE BY MOUTH EVERY DAY   Yes Chelle S Jeffery, PA-C  LOTREL 5-20 MG per capsule TAKE ONE CAPSULE BY MOUTH EVERY DAY.   Yes [name], MD  metFORMIN (GLUCOPHAGE) 1000 MG tablet Take 0.5 tablets (500 mg total) by mouth 2 (two) times daily with a meal. 08/15/13  Yes [name], MD  pravastatin (PRAVACHOL) 40 MG tablet TAKE 1 TABLET BY MOUTH EVERY DAY 06/24/13  Yes [name], MD    Allergies:  Allergies  Allergen Reactions  . Codeine   . Sulfonamide Derivatives     History   Social History  . Marital Status: Legally Separated    Spouse Name: N/A    Number of Children: N/A  . Years of Education: N/A   Occupational History  . Not on file.   Social History Main Topics  . Smoking  status: Former Smoker -- 2.00 packs/day for 20 years    Types: Cigarettes    Quit date: 05/02/1979  . Smokeless tobacco: Current User    Types: Chew  . Alcohol Use: Not on file  . Drug Use: Not on file  . Sexual Activity: Not on file   Other Topics Concern  . Not on file   Social History Narrative  . No narrative on file     Review of Systems: Constitutional: negative for chills, fever, night sweats, weight changes, or fatigue  HEENT: negative for vision changes, hearing loss, congestion, rhinorrhea, ST, epistaxis, or sinus pressure Cardiovascular: negative for chest pain or palpitations Respiratory: negative for  hemoptysis, wheezing, shortness of breath, or cough Abdominal: negative for abdominal pain, nausea, vomiting, diarrhea, or constipation Dermatological: negative for rash Neurologic: negative for headache, dizziness, or syncope All other systems reviewed and are otherwise negative with the exception to those above and in the HPI.   Physical Exam: Blood pressure 132/70, pulse 110, resp. rate 16, height 5' 5.5" (1.664 m), weight 263 lb (119.296 kg), SpO2 96.00%., Body mass index is 43.08 kg/(m^2). General: Well developed, well nourished, in no acute distress. Head: Normocephalic, atraumatic, eyes without discharge, sclera non-icteric, nares are without discharge. Bilateral auditory canals clear, TM's are without perforation, pearly grey and translucent with reflective cone of light bilaterally. Oral cavity moist, posterior pharynx without exudate, erythema, peritonsillar abscess, or post nasal drip.  Neck: Supple. No thyromegaly. Full ROM. No lymphadenopathy. Lungs: Clear bilaterally to auscultation without wheezes, rales, or rhonchi. Breathing is unlabored. Heart: RRR with S1 S2. No murmurs, rubs, or gallops appreciated. Abdomen: Soft, non-tender, non-distended with normoactive bowel sounds. No hepatomegaly. No rebound/guarding. No obvious abdominal masses. Msk:  Strength and tone normal for age. Extremities/Skin: Warm and dry. No clubbing or cyanosis. No edema. No rashes or suspicious lesions. 2+ pedal edema.  Neuro: Alert and oriented X 3. Moves all extremities spontaneously. Gait is normal. CNII-XII grossly in tact. Psych:  Responds to questions appropriately with a normal affect.   Results for orders placed in visit on 08/15/13  COMPLETE METABOLIC PANEL WITH GFR      Result Value Ref Range   Sodium 130 (*) 135 - 145 mEq/L   Potassium 4.1  3.5 - 5.3 mEq/L   Chloride 98  96 - 112 mEq/L   CO2 26  19 - 32 mEq/L   Glucose, Bld 159 (*) 70 - 99 mg/dL   BUN 6  6 - 23 mg/dL   Creat 1.09  0.50 -  1.35 mg/dL   Total Bilirubin 0.7  0.2 - 1.2 mg/dL   Alkaline Phosphatase 51  39 - 117 U/L   AST 12  0 - 37 U/L   ALT <8  0 - 53 U/L   Total Protein 7.1  6.0 - 8.3 g/dL   Albumin 4.4  3.5 - 5.2 g/dL   Calcium 8.5  8.4 - 10.5 mg/dL   GFR, Est African American 77     GFR, Est Non African American 67    PSA, MEDICARE      Result Value Ref Range   PSA 2.47  <=4.00 ng/mL  LIPID PANEL      Result Value Ref Range   Cholesterol 147  0 - 200 mg/dL   Triglycerides 195 (*) <150 mg/dL   HDL 45  >39 mg/dL   Total CHOL/HDL Ratio 3.3     VLDL 39  0 - 40 mg/dL   LDL Cholesterol 63  0 - 99 mg/dL  POCT UA - MICROSCOPIC ONLY      Result Value Ref Range   WBC, Ur, HPF, POC 0-1     RBC, urine, microscopic 1-3     Bacteria, U Microscopic trace     Mucus, UA neg     Epithelial cells, urine per micros neg     Crystals, Ur, HPF, POC neg     Casts, Ur, LPF, POC neg     Yeast, UA neg    POCT URINALYSIS DIPSTICK      Result Value Ref Range   Color, UA yellow     Clarity, UA clear     Glucose, UA 250     Bilirubin, UA neg     Ketones, UA neg     Spec Grav, UA <=1.005     Blood, UA neg     pH, UA 6.5     Protein, UA neg     Urobilinogen, UA 0.2     Nitrite, UA neg     Leukocytes, UA Trace    POCT GLYCOSYLATED HEMOGLOBIN (HGB A1C)      Result Value Ref Range   Hemoglobin A1C 7.0       ASSESSMENT AND PLAN:  73 y.o. year old male with Need for prophylactic vaccination and inoculation against influenza - Plan: Flu Vaccine QUAD 36+ mos IM  Primary osteoarthritis of left knee - Plan: HYDROcodone-acetaminophen (NORCO) 7.5-325 MG per tablet, DISCONTINUED: HYDROcodone-acetaminophen (NORCO) 7.5-325 MG per tablet, DISCONTINUED: HYDROcodone-acetaminophen (NORCO) 7.5-325 MG per tablet  Type II or unspecified type diabetes mellitus without mention of complication, not stated as uncontrolled - Plan: Comprehensive metabolic panel, Hemoglobin A1c, CANCELED: POCT glycosylated hemoglobin (Hb A1C)  Anxiety  state, unspecified - Plan: clonazePAM (KLONOPIN) 1 MG tablet     Signed, [name], MD 11/14/2013 9:37 AM

## 2013-11-16 ENCOUNTER — Telehealth: Payer: Self-pay | Admitting: *Deleted

## 2013-11-16 DIAGNOSIS — R0902 Hypoxemia: Secondary | ICD-10-CM

## 2013-11-16 DIAGNOSIS — J45902 Unspecified asthma with status asthmaticus: Secondary | ICD-10-CM | POA: Diagnosis not present

## 2013-11-16 NOTE — Telephone Encounter (Signed)
[facility] called to ask for an order for O2 at night. Night time oximetry report following- Pt oxygen level went below 88% for 55 minutes straight. He had 230 periods of deoxygenation. Report will be placed in Dr. Tommi Rumps box. I have pended order for O2 HS.

## 2013-11-17 NOTE — Telephone Encounter (Signed)
Faxed order to [facility]!

## 2013-11-17 NOTE — Telephone Encounter (Signed)
I have placed the order and put the script in your box to sign. Please give this back once signed and I will get it to [facility].  Thanks!

## 2013-11-17 NOTE — Telephone Encounter (Signed)
I do not know how to order this.  I would like to order it, but don't know how.

## 2013-11-23 ENCOUNTER — Telehealth: Payer: Self-pay

## 2013-11-23 NOTE — Telephone Encounter (Signed)
[facility] Medicare dept sent notice asking for records going back 6 mos supporting DM supplies per Medicare request. I have sent in OV notes and labs from both OVs w/in 6 mos. We do not have any further records.

## 2013-11-28 ENCOUNTER — Other Ambulatory Visit: Payer: Self-pay | Admitting: Family Medicine

## 2013-11-28 NOTE — Telephone Encounter (Signed)
Pt is requesting a refill of one touch diabetes strips please!

## 2013-11-29 ENCOUNTER — Other Ambulatory Visit: Payer: Self-pay | Admitting: Family Medicine

## 2013-12-14 ENCOUNTER — Encounter: Payer: Self-pay | Admitting: Family Medicine

## 2013-12-15 ENCOUNTER — Telehealth: Payer: Self-pay | Admitting: Radiology

## 2013-12-15 NOTE — Telephone Encounter (Signed)
Dr Milus GlazierLauenstein has gotten a request for a compounded medication for joint pain and scars/ Dr Milus GlazierLauenstein has asked me to call patient and let him know he does not feel something like this will help him, I have called patient to advise, and also to company so they will not continue to send requests.

## 2013-12-19 NOTE — Telephone Encounter (Signed)
[facility] called back and LMOM concerning Rx. I called them back again and gave them the info below. She stated that there was no note in system from 12/15/13, but she will put note in now that Rx is denied and pt aware.

## 2013-12-29 ENCOUNTER — Telehealth: Payer: Self-pay | Admitting: Family Medicine

## 2013-12-29 DIAGNOSIS — E119 Type 2 diabetes mellitus without complications: Secondary | ICD-10-CM

## 2013-12-29 MED ORDER — GLUCOSE BLOOD VI STRP: ORAL_STRIP | Status: DC

## 2013-12-29 NOTE — Telephone Encounter (Signed)
Patient needs refill on diabetic strips   [phone]

## 2013-12-29 NOTE — Telephone Encounter (Signed)
Checked w/pharm and they did not have the rx we sent in Sept. I resent Rx and pt notified.

## 2014-01-10 ENCOUNTER — Telehealth: Payer: Self-pay | Admitting: Family Medicine

## 2014-01-10 NOTE — Telephone Encounter (Signed)
Patient currently has [employer] insurance however is planning to switch to [employer]. This plan does not cover his Lotrel however if we fax them a non formulary??? request they will cover the medicine for him. Please call for more information. [phone] Fax # [phone].  [phone]

## 2014-01-11 NOTE — Telephone Encounter (Signed)
Pt advised once refill is needed and processed through his new insurance the pharmacy will contact us with the PA needed.

## 2014-01-14 ENCOUNTER — Telehealth: Payer: Self-pay

## 2014-01-14 NOTE — Telephone Encounter (Signed)
Pt wanted to call to let us know that he has changed prescription drug plan to [employer]. [phone] Id# [id number]

## 2014-01-17 ENCOUNTER — Other Ambulatory Visit: Payer: Self-pay | Admitting: Family Medicine

## 2014-02-02 ENCOUNTER — Ambulatory Visit (INDEPENDENT_AMBULATORY_CARE_PROVIDER_SITE_OTHER): Payer: Medicare Other | Admitting: Family Medicine

## 2014-02-02 ENCOUNTER — Encounter: Payer: Self-pay | Admitting: Family Medicine

## 2014-02-02 VITALS — BP 116/75 | HR 98 | Temp 98.4°F | Resp 16 | Ht 66.0 in | Wt 256.0 lb

## 2014-02-02 DIAGNOSIS — F411 Generalized anxiety disorder: Secondary | ICD-10-CM | POA: Diagnosis not present

## 2014-02-02 DIAGNOSIS — E785 Hyperlipidemia, unspecified: Secondary | ICD-10-CM | POA: Diagnosis not present

## 2014-02-02 DIAGNOSIS — E119 Type 2 diabetes mellitus without complications: Secondary | ICD-10-CM

## 2014-02-02 DIAGNOSIS — M1712 Unilateral primary osteoarthritis, left knee: Secondary | ICD-10-CM | POA: Diagnosis not present

## 2014-02-02 LAB — POCT GLYCOSYLATED HEMOGLOBIN (HGB A1C): Hemoglobin A1C: 6.7

## 2014-02-02 MED ORDER — PRAVASTATIN SODIUM 40 MG PO TABS: 40.0000 mg | ORAL_TABLET | Freq: Every day | ORAL | Status: DC

## 2014-02-02 MED ORDER — METFORMIN HCL 1000 MG PO TABS: 500.0000 mg | ORAL_TABLET | Freq: Two times a day (BID) | ORAL | Status: DC

## 2014-02-02 MED ORDER — CLONAZEPAM 1 MG PO TABS: 1.0000 mg | ORAL_TABLET | Freq: Two times a day (BID) | ORAL | Status: DC | PRN

## 2014-02-02 MED ORDER — HYDROCODONE-ACETAMINOPHEN 7.5-325 MG PO TABS: 1.0000 | ORAL_TABLET | Freq: Three times a day (TID) | ORAL | Status: DC | PRN

## 2014-02-02 NOTE — Progress Notes (Signed)
Subjective:    Patient ID: Darin Gonzalez, male    DOB: [birth year], 73 y.o.   MRN: [id number] This chart was scribed for Elvina Sidle, MD by Abel Presto, ED Scribe. This patient was seen in room 27 and the patient's care was started at 10:41 AM.   HPI HPI Comments: Darin Gonzalez is a 73 y.o. male with history of diabetes who presents to the [facility] for a 3 month follow-up.  Pt states he is doing well, but has not had any problems with sleep apnea in the past 3 months. Pt notes he has lost some weight and has changed his dietary habits to not include as much grease and fattening foods. Pt states he is still having pain in his feet sometimes.  He denies having trouble ambulating. He has not had a colonoscopy in a couple years.  He has not seen an opthamologist this year. Pt denies chest pain, SOB, dyspnea any digestion problems. Pt states he recently switched to a [facility] plan.   History reviewed. No pertinent past medical history.   Past Surgical History  Procedure Laterality Date  . Finger amputation      Family History  Problem Relation Age of Onset  . Heart disease Mother   . Heart disease Father     History   Social History  . Marital Status: Legally Separated    Spouse Name: N/A    Number of Children: N/A  . Years of Education: N/A   Occupational History  . Not on file.   Social History Main Topics  . Smoking status: Former Smoker -- 2.00 packs/day for 20 years    Types: Cigarettes    Quit date: 05/02/1979  . Smokeless tobacco: Current User    Types: Chew  . Alcohol Use: No  . Drug Use: No  . Sexual Activity: No   Other Topics Concern  . Not on file   Social History Narrative   Allergies  Allergen Reactions  . Codeine   . Sulfonamide Derivatives       Review of Systems  Respiratory: Negative for apnea and shortness of breath.   Cardiovascular: Negative for chest pain.  Gastrointestinal: Negative for abdominal pain.    Neurological:       Paresthesias in feet       Objective:   Physical Exam  Constitutional: He is oriented to person, place, and time. He appears well-developed and well-nourished.  HENT:  Head: Normocephalic.  Eyes: Conjunctivae are normal.  Neck: Normal range of motion. Neck supple.  Pulmonary/Chest: Effort normal.  Musculoskeletal: Normal range of motion.  Neurological: He is alert and oriented to person, place, and time.  Skin: Skin is warm and dry.  Psychiatric: He has a normal mood and affect. His behavior is normal.  Nursing note and vitals reviewed.  Toe jam was cleansed after foot soak, nails clipped Time spent with patient 30 minutes face-to-face      BP 116/75 mmHg  Pulse 98  Temp(Src) 98.4 F (36.9 C)  Resp 16  Ht 5\' 6"  (1.676 m)  Wt 256 lb (116.121 kg)  BMI 41.34 kg/m2  SpO2 93%   Assessment & Plan:   This chart was scribed in my presence and reviewed by me personally.    ICD-9-CM ICD-10-CM   1. Primary osteoarthritis of left knee 715.16 M17.12 HYDROcodone-acetaminophen (NORCO) 7.5-325 MG per tablet  2. Anxiety state 300.00 F41.1 clonazePAM (KLONOPIN) 1 MG tablet  3. Diabetes mellitus without  complication 250.00 E11.9 metFORMIN (GLUCOPHAGE) 1000 MG tablet     Microalbumin, urine     POCT glycosylated hemoglobin (Hb A1C)     Comprehensive metabolic panel  4. Hyperlipidemia 272.4 E78.5 pravastatin (PRAVACHOL) 40 MG tablet   Patient activity stressed-patient to walk daily for half an hour. I also stressed hygiene of his feet, asking him to soak his feet daily and swab the Detrol is between the toes away  Recheck 3 months  Signed, Elvina Sidle, MD

## 2014-02-03 LAB — MICROALBUMIN, URINE: Microalb, Ur: 0.6 mg/dL (ref <2.0)

## 2014-02-03 LAB — COMPREHENSIVE METABOLIC PANEL
ALT: 8 U/L (ref 0–53)
AST: 16 U/L (ref 0–37)
Albumin: 4 g/dL (ref 3.5–5.2)
Alkaline Phosphatase: 46 U/L (ref 39–117)
BUN: 8 mg/dL (ref 6–23)
CO2: 29 mEq/L (ref 19–32)
Calcium: 8.9 mg/dL (ref 8.4–10.5)
Chloride: 96 mEq/L (ref 96–112)
Creat: 1.34 mg/dL (ref 0.50–1.35)
Glucose, Bld: 149 mg/dL — ABNORMAL HIGH (ref 70–99)
Potassium: 4.7 mEq/L (ref 3.5–5.3)
Sodium: 134 mEq/L — ABNORMAL LOW (ref 135–145)
Total Bilirubin: 0.8 mg/dL (ref 0.2–1.2)
Total Protein: 6.6 g/dL (ref 6.0–8.3)

## 2014-02-08 ENCOUNTER — Other Ambulatory Visit: Payer: Self-pay

## 2014-02-08 DIAGNOSIS — J410 Simple chronic bronchitis: Secondary | ICD-10-CM

## 2014-02-08 MED ORDER — ALBUTEROL SULFATE HFA 108 (90 BASE) MCG/ACT IN AERS: 2.0000 | INHALATION_SPRAY | Freq: Four times a day (QID) | RESPIRATORY_TRACT | Status: DC | PRN

## 2014-03-09 ENCOUNTER — Other Ambulatory Visit: Payer: Self-pay

## 2014-03-09 DIAGNOSIS — F411 Generalized anxiety disorder: Secondary | ICD-10-CM

## 2014-03-09 DIAGNOSIS — M1712 Unilateral primary osteoarthritis, left knee: Secondary | ICD-10-CM

## 2014-03-09 MED ORDER — HYDROCODONE-ACETAMINOPHEN 7.5-325 MG PO TABS: 1.0000 | ORAL_TABLET | Freq: Three times a day (TID) | ORAL | Status: DC | PRN

## 2014-03-09 NOTE — Telephone Encounter (Signed)
Pt called and stated that Dr L was going to write his Rxs for hydrocodone and clonazepam when he came to his nephew's appt w/him tomorrow, but he is not going to be able to accompany nephew. Pt would like for Dr L to get his Rxs ready and he will just come p/up when ready. Called pt back and advised he should have RFs at pharm for clonazepam. Pending RF of hydrocodone.

## 2014-03-10 ENCOUNTER — Telehealth: Payer: Self-pay

## 2014-03-10 NOTE — Telephone Encounter (Signed)
LMVM patient's RX is ready for pickup at [facility] appointment center.

## 2014-03-13 NOTE — Telephone Encounter (Signed)
Pt picked up hydrocodone Rx from [facility]

## 2014-03-13 NOTE — Telephone Encounter (Signed)
Hydrocodone already p/up from [facility]. RFs clonazepam and alprazolam faxed.

## 2014-03-20 DIAGNOSIS — J45902 Unspecified asthma with status asthmaticus: Secondary | ICD-10-CM | POA: Diagnosis not present

## 2014-03-20 DIAGNOSIS — J4520 Mild intermittent asthma, uncomplicated: Secondary | ICD-10-CM | POA: Diagnosis not present

## 2014-03-28 ENCOUNTER — Other Ambulatory Visit: Payer: Self-pay

## 2014-04-01 ENCOUNTER — Telehealth: Payer: Self-pay

## 2014-04-01 DIAGNOSIS — J410 Simple chronic bronchitis: Secondary | ICD-10-CM

## 2014-04-01 NOTE — Telephone Encounter (Signed)
The patient called to ask for a generic prescription for

## 2014-04-01 NOTE — Telephone Encounter (Signed)
The patient called to ask for a generic prescription for albuterol (PROAIR HFA) 108 (90 BASE) MCG/ACT inhaler.  He said that [facility] not [facility] PROAIR, and he cannot afford to pay it.  [facility] on [address] [place] [address]  Please advise.  Thank you.  CB#: [phone]

## 2014-04-03 ENCOUNTER — Telehealth: Payer: Self-pay

## 2014-04-03 DIAGNOSIS — J45901 Unspecified asthma with (acute) exacerbation: Secondary | ICD-10-CM

## 2014-04-03 DIAGNOSIS — E119 Type 2 diabetes mellitus without complications: Secondary | ICD-10-CM

## 2014-04-03 DIAGNOSIS — M1712 Unilateral primary osteoarthritis, left knee: Secondary | ICD-10-CM

## 2014-04-03 NOTE — Telephone Encounter (Signed)
Pt called stating his new insurance [employer] will not pay for his Oxygen equipment. He just [place]. He doesn't know what to do. He would also like medication refills on HYDROcodone-acetaminophen (NORCO) 7.5-325 MG per tablet [[id number]]([id number][[id number]](mailed to him, he no longer has a car), albuterol (PROAIR HFA) 108 (90 BASE) MCG/ACT inhaler [[id number]],UUVOZD[[id number]],LOTREL 5-20 MG per capsule [[id number]][[id number]] and ONE TOUCH ULTRA TEST test strip [[id number]][[id number]]

## 2014-04-03 NOTE — Telephone Encounter (Signed)
Are there any alternatives?

## 2014-04-03 NOTE — Telephone Encounter (Signed)
Any albuterol inhaler is fine. It doesn't matter which brand, whichever his pharmacy benefit plan prefers (Ventolin. Proventil, etc).

## 2014-04-04 MED ORDER — AMLODIPINE BESY-BENAZEPRIL HCL 5-20 MG PO CAPS: ORAL_CAPSULE | ORAL | Status: DC

## 2014-04-04 MED ORDER — HYDROCODONE-ACETAMINOPHEN 7.5-325 MG PO TABS: 1.0000 | ORAL_TABLET | Freq: Three times a day (TID) | ORAL | Status: DC | PRN

## 2014-04-04 NOTE — Telephone Encounter (Signed)
Called pharm and they reported that the ProAir is covered, pt just has to meet the deductible first. Tried to call pt, but phone just rang, no VM answered.

## 2014-04-04 NOTE — Telephone Encounter (Signed)
Called pt who reported that [facility] had planned to p/up his CPAP machine for sleep apnea, but when he last talked to them they said it should be covered under Medicare Part B.  Pt was confused as to whether they are still coming to get the machine or not. He agreed to call [facility] and call me back if there is a problem that we can help him with. Sent in RFs of Lotrel to cover until appt in March. ProAir is ready at pharm for p/up, pt needs to meet deductible but it is covered by ins. One Touch Ultra strips are also covered by ins, but the [facility] Medicare form needs to be completed bf it will be filled. Pharmacist is faxing to me.  Dr L, pt reqs RF of hydrocodone (due to be filled on 04/15/14 per pt) mailed to him so that he will have it when it is time to be filled. Pended.

## 2014-04-05 MED ORDER — ALBUTEROL SULFATE (2.5 MG/3ML) 0.083% IN NEBU: 2.5000 mg | INHALATION_SOLUTION | Freq: Three times a day (TID) | RESPIRATORY_TRACT | Status: DC

## 2014-04-05 NOTE — Telephone Encounter (Signed)
Pt called back and clarified that [facility] won't allow pt to cont to get his O2 from [facility] and they are picking up O2 on Friday. He stated that he has to use [facility] ph # [phone], [phone] [phone]. I called [facility] and was told that they will need copies of the clinical notes w/sleep study results and overnight pulse oximetry along with order for what is needed. Pt stated that he just has an O2 tank he uses at night and a "bottle" that [facility] gave him to use if the other didn't work. He stated he does not have a CPAP machine.

## 2014-04-05 NOTE — Telephone Encounter (Signed)
Thanks for sorting this out.   Yes, if you don't mind, I'll order the nebulizer solution if you take care of the nebulizer machine. KL

## 2014-04-05 NOTE — Telephone Encounter (Signed)
[facility]. Please advise.

## 2014-04-05 NOTE — Telephone Encounter (Signed)
Called pt who reports that he can not afford to pay $160 for his albuterol inhaler until his deductible is met. D/W pt that any of the albuterol inhalers will cost about the same thing and will be expensive to pay OOP until meets his deductible. Dr L, I believe that the nebulizer solution can be ordered along w/nebulizer from [place] where pt will need to get his O2 now, and that it can be covered sometimes under Plan B instead of Plan D. I called [facility] about O2 (see other message) and asked about this, and was told that pt would not have a deductible for the neb and sol for it, but pt would have to pay 20% and ins would pay 80%. Do you want to print Rxs for these for pt to try instead of the albuterol inhalers and we can see if it would be less expensive, or can you think of anything else?

## 2014-04-06 ENCOUNTER — Other Ambulatory Visit: Payer: Self-pay | Admitting: Family Medicine

## 2014-04-06 DIAGNOSIS — J410 Simple chronic bronchitis: Secondary | ICD-10-CM

## 2014-04-06 DIAGNOSIS — J45901 Unspecified asthma with (acute) exacerbation: Secondary | ICD-10-CM | POA: Insufficient documentation

## 2014-04-06 MED ORDER — UNABLE TO FIND: Status: AC

## 2014-04-06 MED ORDER — UNABLE TO FIND: Status: DC

## 2014-04-06 MED ORDER — ALBUTEROL SULFATE (2.5 MG/3ML) 0.083% IN NEBU: 2.5000 mg | INHALATION_SOLUTION | Freq: Three times a day (TID) | RESPIRATORY_TRACT | Status: DC

## 2014-04-06 NOTE — Telephone Encounter (Signed)
I called [facility] to see if they could fax me all of the tests that Darin Gonzalez will need and I was told that they have already faxed all of that to [facility] and all [facility] should need is a Rx for an O2 concentrator and back up tank.

## 2014-04-06 NOTE — Telephone Encounter (Signed)
Rx for neb machine printed and both it and sol Rx faxed to [facility]. Pt notified.

## 2014-04-06 NOTE — Addendum Note (Signed)
Addended by: Sheppard PlumberBRIGGS, Joetta Delprado A on: 04/06/2014 09:48 AM   Modules accepted: Orders

## 2014-04-06 NOTE — Addendum Note (Signed)
Addended by: Sheppard PlumberBRIGGS, Sakeena Teall A on: 04/06/2014 09:52 AM   Modules accepted: Orders

## 2014-04-06 NOTE — Telephone Encounter (Signed)
Faxed order for O2 to [facility]. Pt notified

## 2014-04-07 ENCOUNTER — Telehealth: Payer: Self-pay

## 2014-04-07 NOTE — Telephone Encounter (Signed)
PA needed for next month RF and beyond of Lotrel. Spoke to pt who reported he has taken brand name Lotrel for over 20 years. He tried some other BP meds bf that which were not effective but doesn't remember name. He also has tried the generics of amlodipine and benazepril and they are not as effective used together as the brand Lotrel.

## 2014-04-08 NOTE — Telephone Encounter (Signed)
Pt states that the rx that was sent in for test strips were sent in on a blank form

## 2014-04-11 NOTE — Telephone Encounter (Signed)
Pt called and stated that he doesn't think the deductible is the problem with ProAir any more and it needs a form filled out for auth. Explained that it will just need to be switched to whichever of the other two albuterols ins covers, and that I will call pharm back and have them try that. Called pharm and they were able to get Ventolin to go through for $3.60. Called pt to inform.

## 2014-04-11 NOTE — Telephone Encounter (Signed)
Talked w/pt and notified that I faxed form to [facility] dept that WAS signed yesterday afternoon w/confirmation. They may have received a prev copy, but should have now received a completed one.

## 2014-04-13 ENCOUNTER — Telehealth: Payer: Self-pay

## 2014-04-13 NOTE — Telephone Encounter (Signed)
Called pharm who reported that [facility] Medicare received the unsigned fax last Fri. I sent in the signed copy w/confirmation on 2/15 (Mon). Pharm will contact Medicare dept and see if they have this one. If not he will have them send another copy and call me back.

## 2014-04-13 NOTE — Telephone Encounter (Signed)
The CMN form needs a doctor signature. This was faxed back on Fri. Do you know if you received it? [facility] [address]

## 2014-04-14 ENCOUNTER — Telehealth: Payer: Self-pay

## 2014-04-14 NOTE — Telephone Encounter (Signed)
Patient called to ask about a recent prescription for albuterol and for other rx refills.  He said he used to get a script for ProAir for three sprays, but his last prescription was for another brand and he only got one spray.  He doesn't understand why it was changed.  He also expressed concern about the cost; he said it cost the same amount as his last inhaler, but he gets two less sprays.  He also would like a refill for Metformin and his One Touch strips.  Please advise.  Thank you.  CB#: [phone]

## 2014-04-15 ENCOUNTER — Other Ambulatory Visit: Payer: Self-pay

## 2014-04-15 DIAGNOSIS — E119 Type 2 diabetes mellitus without complications: Secondary | ICD-10-CM

## 2014-04-15 DIAGNOSIS — E785 Hyperlipidemia, unspecified: Secondary | ICD-10-CM

## 2014-04-15 DIAGNOSIS — F411 Generalized anxiety disorder: Secondary | ICD-10-CM

## 2014-04-15 DIAGNOSIS — J3089 Other allergic rhinitis: Secondary | ICD-10-CM

## 2014-04-15 NOTE — Telephone Encounter (Signed)
Patient called following up on his previous request on 04/14/2014. Patient request for more diabetic strips(one touch ultra). Patient request for nurse to call [employer] to change his pharmacy. Patient request for a home delivery. [phone]

## 2014-04-17 MED ORDER — FLUTICASONE PROPIONATE 50 MCG/ACT NA SUSP: 1.0000 | Freq: Every day | NASAL | Status: DC

## 2014-04-17 MED ORDER — CLONAZEPAM 1 MG PO TABS: 1.0000 mg | ORAL_TABLET | Freq: Two times a day (BID) | ORAL | Status: DC | PRN

## 2014-04-17 MED ORDER — METFORMIN HCL 1000 MG PO TABS: 500.0000 mg | ORAL_TABLET | Freq: Two times a day (BID) | ORAL | Status: DC

## 2014-04-17 MED ORDER — GLUCOSE BLOOD VI STRP: ORAL_STRIP | Status: DC

## 2014-04-17 MED ORDER — LOTREL 5-20 MG PO CAPS: ORAL_CAPSULE | ORAL | Status: DC

## 2014-04-17 MED ORDER — PRAVASTATIN SODIUM 40 MG PO TABS: 40.0000 mg | ORAL_TABLET | Freq: Every day | ORAL | Status: DC

## 2014-04-17 NOTE — Telephone Encounter (Signed)
See message on 04/15/14. [facility]xs [facility] as req'd by pt.

## 2014-04-17 NOTE — Telephone Encounter (Signed)
Called pt and he req'd that clonazepam, lotrel, flonase, metformin and pravastatin, as well as test strips [facility]. Sent all but Clonazepam. Pt stated that [employer] said they CAN fill a 90 day of clonazepam (just not his pain med) and he req's that it also be sent in as a 90 day supply to help with cost and convenience since pt does not have a car. I advised that I will send req, but that provider may or may not want to fill clonazepam for 90 days. Dr L, please advise, pended. [facility] if [facility] form was ever accepted and he reported that it was, but pt does not want to pay the $20 copay. He is working w/ins to see if anything can be done, but nothing needed from us.

## 2014-04-19 ENCOUNTER — Other Ambulatory Visit: Payer: Self-pay

## 2014-04-19 MED ORDER — BLOOD GLUCOSE MONITOR KIT: PACK | Status: DC

## 2014-04-19 MED ORDER — ESOMEPRAZOLE MAGNESIUM 40 MG PO CPDR: DELAYED_RELEASE_CAPSULE | ORAL | Status: DC

## 2014-04-19 MED ORDER — LANCETS MISC: Status: DC

## 2014-04-19 MED ORDER — BD SWAB SINGLE USE REGULAR PADS: MEDICATED_PAD | Status: DC

## 2014-04-19 MED ORDER — ACCU-CHEK SMARTVIEW CONTROL VI LIQD: Status: DC

## 2014-04-19 MED ORDER — GLUCOSE BLOOD VI STRP: ORAL_STRIP | Status: DC

## 2014-04-20 ENCOUNTER — Other Ambulatory Visit: Payer: Self-pay

## 2014-04-20 DIAGNOSIS — J410 Simple chronic bronchitis: Secondary | ICD-10-CM

## 2014-04-20 MED ORDER — ALBUTEROL SULFATE HFA 108 (90 BASE) MCG/ACT IN AERS: 2.0000 | INHALATION_SPRAY | Freq: Four times a day (QID) | RESPIRATORY_TRACT | Status: DC | PRN

## 2014-04-20 NOTE — Telephone Encounter (Signed)
PA was completed over the phone w/[employer] and decision is pending. I was advised it could take 24-72 hrs. Notified pt of status

## 2014-04-20 NOTE — Telephone Encounter (Signed)
Pt called and stated that [facility] does not have his clonazepam or albuterol inhaler. [facility] and gave pharmacist VO for clonazepram as written by Dr L on 2/22 and sent Rx for Ventolin after pharm advised that is the preferred albuterol inh.

## 2014-04-20 NOTE — Addendum Note (Signed)
Addended by: Sheppard PlumberBRIGGS, Shem Plemmons A on: 04/20/2014 01:40 PM   Modules accepted: Orders

## 2014-04-21 NOTE — Telephone Encounter (Signed)
Pt called and stated that he called [employer] last evening and was told they don't have a record of my calling to do PA. I called [employer] and asked for status. They found our PA that we initiated right away and stated it is still in review. Notified pt of status.

## 2014-04-25 ENCOUNTER — Telehealth: Payer: Self-pay

## 2014-04-25 ENCOUNTER — Other Ambulatory Visit: Payer: Self-pay

## 2014-04-25 MED ORDER — IBUPROFEN 600 MG PO TABS: 600.0000 mg | ORAL_TABLET | Freq: Three times a day (TID) | ORAL | Status: DC | PRN

## 2014-04-25 NOTE — Telephone Encounter (Signed)
Lotrel coverage was denied bc pt has not tried/failed preferred alternatives : lisinopril or enalapril. Dr L, do you want to try one of these meds for pt. I have advised pt and he stated that he does not have the money to pay OOP for the Lotrel.

## 2014-04-25 NOTE — Telephone Encounter (Signed)
This message is for [place]. Chanetta MarshallJimmy wants you to give him a CB in regard to a medication being denied from his insurance company. Please advise at [phone]

## 2014-04-26 ENCOUNTER — Other Ambulatory Visit: Payer: Self-pay | Admitting: Family Medicine

## 2014-04-26 DIAGNOSIS — I10 Essential (primary) hypertension: Secondary | ICD-10-CM

## 2014-04-26 MED ORDER — ENALAPRIL MALEATE 20 MG PO TABS: 20.0000 mg | ORAL_TABLET | Freq: Every day | ORAL | Status: DC

## 2014-04-26 NOTE — Telephone Encounter (Signed)
I have called in enalapril

## 2014-04-26 NOTE — Telephone Encounter (Signed)
Notified pt of new Rx and discussed med. Pt will give it a try and let us know if it is not working for him.

## 2014-04-26 NOTE — Telephone Encounter (Signed)
See notes under 04/07/14 phone mess regarding Lotrel PA

## 2014-04-26 NOTE — Telephone Encounter (Signed)
Patient is calling back to speak with [name] in regards to blood pressure medication.

## 2014-04-28 ENCOUNTER — Telehealth: Payer: Self-pay

## 2014-04-28 NOTE — Telephone Encounter (Signed)
Patient is trying to get [facility] pharmacy to cover Lotrel.  They have previously denied coverage and required him to get alternative BP medication.  He is requesting for a doctor to write and fax a statement to [facility] stating that they recommend that the patient takes amLODipine-benazepril (LOTREL) 5-20 MG per capsule for his blood pressure.  He said that the letter also needs to state that he has tried alternative medications, such as amLODipine (NORVASC) 5 MG tablet, but they do not work as well as LobbyistLotrel.  Fax: [phone] Reference number: [id number]  Patient CB#: [phone]

## 2014-05-01 ENCOUNTER — Other Ambulatory Visit: Payer: Self-pay | Admitting: Family Medicine

## 2014-05-01 NOTE — Telephone Encounter (Signed)
Called pt and he reported that [facility] just called and told him that they ARE going to cover the Lotrel, so they will be sending it out to him.

## 2014-05-01 NOTE — Telephone Encounter (Signed)
Opened in error, duplicate.

## 2014-05-09 ENCOUNTER — Other Ambulatory Visit: Payer: Self-pay

## 2014-05-09 DIAGNOSIS — M1712 Unilateral primary osteoarthritis, left knee: Secondary | ICD-10-CM

## 2014-05-09 NOTE — Telephone Encounter (Signed)
Pt called and asked if we could get his Rx of hydrocodone ready for him to p/up at his appt on Thurs. Dr L, I don't know if you want to do it ahead of time, or just wait for appt, since you will probably be refilling his other meds. If you do it ahead of time, let me know and I will get it to [facility] for him to get at appt.

## 2014-05-10 ENCOUNTER — Other Ambulatory Visit: Payer: Self-pay | Admitting: Family Medicine

## 2014-05-11 ENCOUNTER — Encounter: Payer: Self-pay | Admitting: Family Medicine

## 2014-05-11 ENCOUNTER — Ambulatory Visit (INDEPENDENT_AMBULATORY_CARE_PROVIDER_SITE_OTHER): Payer: Commercial Managed Care - HMO | Admitting: Family Medicine

## 2014-05-11 ENCOUNTER — Telehealth: Payer: Self-pay

## 2014-05-11 VITALS — BP 95/62 | HR 93 | Temp 98.1°F | Resp 16 | Ht 66.0 in | Wt 253.6 lb

## 2014-05-11 DIAGNOSIS — M1712 Unilateral primary osteoarthritis, left knee: Secondary | ICD-10-CM | POA: Diagnosis not present

## 2014-05-11 DIAGNOSIS — Z23 Encounter for immunization: Secondary | ICD-10-CM | POA: Diagnosis not present

## 2014-05-11 DIAGNOSIS — B351 Tinea unguium: Secondary | ICD-10-CM | POA: Diagnosis not present

## 2014-05-11 DIAGNOSIS — E785 Hyperlipidemia, unspecified: Secondary | ICD-10-CM

## 2014-05-11 DIAGNOSIS — I10 Essential (primary) hypertension: Secondary | ICD-10-CM | POA: Diagnosis not present

## 2014-05-11 DIAGNOSIS — M5440 Lumbago with sciatica, unspecified side: Secondary | ICD-10-CM

## 2014-05-11 DIAGNOSIS — E119 Type 2 diabetes mellitus without complications: Secondary | ICD-10-CM

## 2014-05-11 LAB — LIPID PANEL
Cholesterol: 126 mg/dL (ref 0–200)
HDL: 50 mg/dL (ref >=40)
LDL Cholesterol: 44 mg/dL (ref 0–99)
Total CHOL/HDL Ratio: 2.5 Ratio
Triglycerides: 161 mg/dL — ABNORMAL HIGH (ref <150)
VLDL: 32 mg/dL (ref 0–40)

## 2014-05-11 LAB — COMPREHENSIVE METABOLIC PANEL
ALT: 8 U/L (ref 0–53)
AST: 11 U/L (ref 0–37)
Albumin: 4.1 g/dL (ref 3.5–5.2)
Alkaline Phosphatase: 46 U/L (ref 39–117)
BUN: 8 mg/dL (ref 6–23)
CO2: 28 mEq/L (ref 19–32)
Calcium: 9.6 mg/dL (ref 8.4–10.5)
Chloride: 100 mEq/L (ref 96–112)
Creat: 1.23 mg/dL (ref 0.50–1.35)
Glucose, Bld: 166 mg/dL — ABNORMAL HIGH (ref 70–99)
Potassium: 4.5 mEq/L (ref 3.5–5.3)
Sodium: 139 mEq/L (ref 135–145)
Total Bilirubin: 0.7 mg/dL (ref 0.2–1.2)
Total Protein: 6.6 g/dL (ref 6.0–8.3)

## 2014-05-11 LAB — HEMOGLOBIN A1C: Hgb A1c MFr Bld: 6.3 % — AB (ref 4.0–6.0)

## 2014-05-11 LAB — POCT GLYCOSYLATED HEMOGLOBIN (HGB A1C): Hemoglobin A1C: 6.3

## 2014-05-11 MED ORDER — HYDROCODONE-ACETAMINOPHEN 7.5-325 MG PO TABS: 1.0000 | ORAL_TABLET | Freq: Three times a day (TID) | ORAL | Status: DC | PRN

## 2014-05-11 MED ORDER — AMLODIPINE BESY-BENAZEPRIL HCL 5-10 MG PO CAPS: 1.0000 | ORAL_CAPSULE | Freq: Every day | ORAL | Status: DC

## 2014-05-11 MED ORDER — PNEUMOCOCCAL 13-VAL CONJ VACC IM SUSP: 0.5000 mL | INTRAMUSCULAR | Status: DC

## 2014-05-11 MED ORDER — TERBINAFINE HCL 250 MG PO TABS: 250.0000 mg | ORAL_TABLET | Freq: Every day | ORAL | Status: AC

## 2014-05-11 NOTE — Progress Notes (Signed)
Patient ID: Darin Gonzalez, male   DOB: [birth year], 74 y.o.   MRN: [id number]  This chart was scribed for Darin Haber, MD by Ladene Artist, ED Scribe. The patient was seen in room 27. Patient's care was started at 10:35 AM.  Patient ID: Darin Gonzalez MRN: [id number], DOB: 1940-07-07, 74 y.o. Date of Encounter: 05/11/2014, 10:35 AM  Primary Physician: Darin Haber, MD  Chief Complaint  Patient presents with  . Diabetes   HPI: 74 y.o. year old male with history below presents for a DM follow-up. Pt denies loss of sensation in his feet, chest pain, SOB, light-headedness and dizziness with standing.   Back Pain Pt reports intermittent chronic lower back pain that radiates down bilateral legs, L worse than R. He states that he could possibly walk 1 block before his legs become weak. Pt denies difficulty urinating and any other urinary symptoms at this time.   Weight Pt states that he has lost approximately 40 lbs in the past year.   He continues to watch diet.  No polyuria or polydipsia  Preventative Maintenance Pt's last colonoscopy was less than 10 years ago. Pt has an eye exam with Dr. Pauline Aus in June.     Home Meds: Prior to Admission medications   Medication Sig Start Date End Date Taking? Authorizing Provider  albuterol (PROVENTIL HFA;VENTOLIN HFA) 108 (90 BASE) MCG/ACT inhaler Inhale 2 puffs into the lungs every 6 (six) hours as needed. 04/20/14   Darin Haber, MD  albuterol (PROVENTIL) (2.5 MG/3ML) 0.083% nebulizer solution Take 3 mLs (2.5 mg total) by nebulization every 8 (eight) hours. 04/06/14   Darin Haber, MD  Alcohol Swabs (B-D SINGLE USE SWABS REGULAR) PADS Test blood sugar 3 times daily due to fluctuating blood sugar. Dx code: E11.9 04/19/14   Darin Haber, MD  amLODipine (NORVASC) 5 MG tablet Take 1 tablet (5 mg total) by mouth daily. Patient not taking: Reported on 02/02/2014 10/07/13   Chelle S Jeffery, PA-C  amLODipine-benazepril (LOTREL) 5-20 MG per  capsule TAKE 1 CAPSULE BY MOUTH EVERY DAY 04/04/14   Darin Haber, MD  benazepril (LOTENSIN) 20 MG tablet Take 1 tablet (20 mg total) by mouth daily. Patient not taking: Reported on 02/02/2014 10/07/13   Chelle S Jeffery, PA-C  Blood Glucose Calibration (ACCU-CHEK SMARTVIEW CONTROL) LIQD Test blood sugar 3 times daily due to fluctuating blood sugar. Dx code: E11.9 04/19/14   Darin Haber, MD  blood glucose meter kit and supplies KIT Test blood sugar 3 times daily due to fluctuating blood sugar. Dx code: E11.9 04/19/14   Darin Haber, MD  Blood Glucose Monitoring Suppl (ONE TOUCH ULTRA SYSTEM KIT) W/DEVICE KIT 1 kit by Does not apply route 3 (three) times daily. 08/15/13   Darin Haber, MD  clonazePAM (KLONOPIN) 1 MG tablet Take 1 tablet (1 mg total) by mouth 2 (two) times daily as needed for anxiety. 04/17/14   Darin Haber, MD  enalapril (VASOTEC) 20 MG tablet Take 1 tablet (20 mg total) by mouth daily. 04/26/14   Darin Haber, MD  esomeprazole (NEXIUM) 40 MG capsule TAKE 1 CAPSULE BY MOUTH EVERY DAY BEFORE BREAKFAST 04/19/14   Darin Haber, MD  fluticasone [facility]) 50 MCG/ACT nasal spray Place 1 spray into both nostrils daily. 04/17/14   Darin Haber, MD  glucose blood test strip Test blood sugar 3 times daily due to fluctuating blood sugar. Dx code: E11.9 04/19/14   Darin Haber, MD  HYDROcodone-acetaminophen [facility]) 7.5-325 MG per tablet Take 1 tablet by mouth 3 (three)  times daily as needed for moderate pain. 05/11/14   Darin Haber, MD  ibuprofen (ADVIL,MOTRIN) 600 MG tablet Take 1 tablet (600 mg total) by mouth every 8 (eight) hours as needed. for pain 04/25/14   Darin Haber, MD  Lancets MISC Test blood sugar 3 times daily due to fluctuating blood sugar. Dx code: E11.9 04/19/14   Darin Haber, MD  LOTREL 5-20 MG per capsule TAKE 1 CAPSULE BY MOUTH EVERY DAY 04/17/14   Darin Haber, MD  metFORMIN (GLUCOPHAGE) 1000 MG tablet Take 0.5 tablets (500 mg total) by mouth 2 (two)  times daily with a meal. 04/17/14   Darin Haber, MD  pravastatin (PRAVACHOL) 40 MG tablet Take 1 tablet (40 mg total) by mouth daily. 04/17/14   Darin Haber, MD  UNABLE TO FIND NEBULIZER MACHINE. DX CODE: 491.0 04/06/14   Darin Haber, MD  UNABLE TO FIND OXYGEN CONCENTRATOR AND BACK UP O2 TANK. NOCTURNAL USE, 2L. DX CODE: 491.0 04/06/14   Darin Haber, MD    Allergies:  Allergies  Allergen Reactions  . Codeine   . Sulfonamide Derivatives     History   Social History  . Marital Status: Legally Separated    Spouse Name: N/A  . Number of Children: N/A  . Years of Education: N/A   Occupational History  . Not on file.   Social History Main Topics  . Smoking status: Former Smoker -- 2.00 packs/day for 20 years    Types: Cigarettes    Quit date: 05/02/1979  . Smokeless tobacco: Current User    Types: Chew  . Alcohol Use: No  . Drug Use: No  . Sexual Activity: No   Other Topics Concern  . Not on file   Social History Narrative    Review of Systems: Constitutional: negative for chills, fever, night sweats, weight changes, or fatigue  HEENT: negative for vision changes, hearing loss, congestion, rhinorrhea, ST, epistaxis, or sinus pressure Cardiovascular: negative for chest pain or palpitations Respiratory: negative for shortness of breath, hemoptysis, wheezing or cough Abdominal: negative for abdominal pain, nausea, vomiting, diarrhea, or constipation Msk: + back pain GU: negative for difficulty urinating  Dermatological: negative for rash, toenails continue to be thick, crumbling and sharp at the edges. Neurologic: negative for dizziness, light-headedness, numbness, headache, or syncope All other systems reviewed and are otherwise negative with the exception to those above and in the HPI.  Physical Exam: Blood pressure 95/62, pulse 93, temperature 98.1 F (36.7 C), temperature source Oral, resp. rate 16, height _0  (1.676 m), weight 253 lb 9.6 oz (115.032 kg),  SpO2 96 %., Body mass index is 40.95 kg/(m^2). General: Well developed, well nourished, in no acute distress. Head: Normocephalic, atraumatic, eyes without discharge, sclera non-icteric, nares are without discharge. Bilateral auditory canals clear, TM's are without perforation, pearly grey and translucent with reflective cone of light bilaterally. Oral cavity moist, posterior pharynx without exudate, erythema, peritonsillar abscess, or post nasal drip.  Neck: Supple. No thyromegaly. Full ROM. No lymphadenopathy. No bruit.  Lungs: Clear bilaterally to auscultation without wheezes, rales, or rhonchi. Breathing is unlabored. Heart: RRR with S1 S2. No murmurs, rubs, or gallops appreciated. Abdomen: Soft, non-tender, non-distended with normoactive bowel sounds. No hepatomegaly. No rebound/guarding. No obvious abdominal masses. Msk:  Strength and tone normal for age. Extremities/Skin: Warm and dry. No clubbing or cyanosis. No edema. No rashes or suspicious lesions. 1+ pedal edema. Thick, crumbling nails on all toenails.  Neuro: Alert and oriented X 3. Moves all extremities spontaneously. Gait  is normal. CNII-XII grossly in tact. Psych:  Responds to questions appropriately with a normal affect.   Labs:  ASSESSMENT AND PLAN:  74 y.o. year old male with  1. Diabetes type 2, controlled   2. Hyperlipidemia   3. Essential hypertension    This chart was scribed in my presence and reviewed by me personally.    ICD-9-CM ICD-10-CM   1. Diabetes type 2, controlled 250.00 E11.9 HM Diabetes Foot Exam     POCT glycosylated hemoglobin (Hb A1C)     Comprehensive metabolic panel     Microalbumin, urine     DT Vaccine greater than 7yo IM     pneumococcal 13-valent conjugate vaccine (PREVNAR 13) injection 0.5 mL  2. Hyperlipidemia 272.4 E78.5 Lipid panel  3. Essential hypertension 401.9 I10 Comprehensive metabolic panel  4. Primary osteoarthritis of left knee 715.16 M17.12 HYDROcodone-acetaminophen (NORCO)  7.5-325 MG per tablet     DISCONTINUED: HYDROcodone-acetaminophen (NORCO) 7.5-325 MG per tablet     DISCONTINUED: HYDROcodone-acetaminophen (NORCO) 7.5-325 MG per tablet  5. Low back pain with sciatica, sciatica laterality unspecified, unspecified back pain laterality 724.3 M54.40 HYDROcodone-acetaminophen (NORCO) 7.5-325 MG per tablet     DISCONTINUED: HYDROcodone-acetaminophen (NORCO) 7.5-325 MG per tablet     DISCONTINUED: HYDROcodone-acetaminophen (NORCO) 7.5-325 MG per tablet  6. Onychomycosis 110.1 B35.1 terbinafine (LAMISIL) 250 MG tablet     Signed, Darin Haber, MD   Signed, Darin Haber, MD 05/11/2014 10:35 AM

## 2014-05-11 NOTE — Patient Instructions (Signed)
Diabetes and Exercise Exercising regularly is important. It is not just about losing weight. It has many health benefits, such as:  Improving your overall fitness, flexibility, and endurance.  Increasing your bone density.  Helping with weight control.  Decreasing your body fat.  Increasing your muscle strength.  Reducing stress and tension.  Improving your overall health. People with diabetes who exercise gain additional benefits because exercise:  Reduces appetite.  Improves the body's use of blood sugar (glucose).  Helps lower or control blood glucose.  Decreases blood pressure.  Helps control blood lipids (such as cholesterol and triglycerides).  Improves the body's use of the hormone insulin by:  Increasing the body's insulin sensitivity.  Reducing the body's insulin needs.  Decreases the risk for heart disease because exercising:  Lowers cholesterol and triglycerides levels.  Increases the levels of good cholesterol (such as high-density lipoproteins [HDL]) in the body.  Lowers blood glucose levels. YOUR ACTIVITY PLAN  Choose an activity that you enjoy and set realistic goals. Your health care provider or diabetes educator can help you make an activity plan that works for you. Exercise regularly as directed by your health care provider. This includes:  Performing resistance training twice a week such as push-ups, sit-ups, lifting weights, or using resistance bands.  Performing 150 minutes of cardio exercises each week such as walking, running, or playing sports.  Staying active and spending no more than 90 minutes at one time being inactive. Even short bursts of exercise are good for you. Three 10-minute sessions spread throughout the day are just as beneficial as a single 30-minute session. Some exercise ideas include:  Taking the dog for a walk.  Taking the stairs instead of the elevator.  Dancing to your favorite song.  Doing an exercise  video.  Doing your favorite exercise with a friend. RECOMMENDATIONS FOR EXERCISING WITH TYPE 1 OR TYPE 2 DIABETES   Check your blood glucose before exercising. If blood glucose levels are greater than 240 mg/dL, check for urine ketones. Do not exercise if ketones are present.  Avoid injecting insulin into areas of the body that are going to be exercised. For example, avoid injecting insulin into:  The arms when playing tennis.  The legs when jogging.  Keep a record of:  Food intake before and after you exercise.  Expected peak times of insulin action.  Blood glucose levels before and after you exercise.  The type and amount of exercise you have done.  Review your records with your health care provider. Your health care provider will help you to develop guidelines for adjusting food intake and insulin amounts before and after exercising.  If you take insulin or oral hypoglycemic agents, watch for signs and symptoms of hypoglycemia. They include:  Dizziness.  Shaking.  Sweating.  Chills.  Confusion.  Drink plenty of water while you exercise to prevent dehydration or heat stroke. Body water is lost during exercise and must be replaced.  Talk to your health care provider before starting an exercise program to make sure it is safe for you. Remember, almost any type of activity is better than none. Document Released: 05/03/2003 Document Revised: 06/27/2013 Document Reviewed: 07/20/2012 Exit[employer] Patient Information 2015 [employer]. This information is not intended to replace advice given to you by your health care provider. Make sure you discuss any questions you have with your health care provider.  

## 2014-05-11 NOTE — Telephone Encounter (Signed)
LMVM for patient advising RX is ready for pickup at [facility] front desk.

## 2014-05-12 ENCOUNTER — Telehealth: Payer: Self-pay

## 2014-05-12 LAB — MICROALBUMIN, URINE: Microalb, Ur: 0.7 mg/dL (ref <2.0)

## 2014-05-12 NOTE — Telephone Encounter (Signed)
Dr L, did you Rx the diabetic shoes at pt's OV yesterday? I had advised pt to talk to you about it with you since he was coming in to see you. Do you need me to do anything to help?

## 2014-05-12 NOTE — Telephone Encounter (Signed)
Darin MccreedyBarbara, patient asked if you could check on his diabetic shoes please. States that Dr. Elbert EwingsL mentioned it during his OV yesterday.   [phone]

## 2014-05-16 NOTE — Telephone Encounter (Signed)
Darin Gonzalez has an order for oxygen,  She has questions about the equipment and levels.  LMOVM in Medical Records   [phone]

## 2014-05-18 ENCOUNTER — Telehealth: Payer: Self-pay

## 2014-05-18 NOTE — Telephone Encounter (Signed)
Patient wants to speak with Ms. [name]. Patient states that he recently got test results back and would like help understanding the information. Please call! [phone]

## 2014-05-19 NOTE — Telephone Encounter (Signed)
Spoke to pt, informed him of normal lab results.

## 2014-05-23 ENCOUNTER — Telehealth: Payer: Self-pay | Admitting: Family Medicine

## 2014-05-23 ENCOUNTER — Telehealth: Payer: Self-pay

## 2014-05-23 NOTE — Telephone Encounter (Signed)
I have now filled out two request forms for diabetic shoes.  I did not order in EPIC because I filled out and faxed forms

## 2014-05-23 NOTE — Telephone Encounter (Signed)
Patient states that his BP medication needs prior authorization.  [phone]

## 2014-05-23 NOTE — Telephone Encounter (Signed)
Pt CB to check on status of DM shoes. Dr L, I asked pt to talk w/you about DM shoes when he came to see you for appt in March, but it doesn't look like you wrote a Rx for them then. Can we send a Rx for DM shoes to pt so that he can take it where ever he likes to get them? Pended, but if you need to change or add anything please do so. Pt would like us to mail the Rx to him. He also wanted you to know that his nephew, Hortense RamalGary Perdue, died last Saturday while mowing the lawn. He reported that Jillyn HiddenGary had a blood clot that went to his heart.

## 2014-05-24 NOTE — Telephone Encounter (Signed)
Notified pt forms were sent. He does not know what company forms were from but he agreed to wait to receive word from the shoe company.

## 2014-05-24 NOTE — Telephone Encounter (Signed)
We just did a PA for Lotrel. Called pt who stated that he thinks it needs another one bc Dr L reduced the dose.

## 2014-05-24 NOTE — Telephone Encounter (Signed)
Completed PA for new strength on covermymeds including advising them that they just approved coverage of higher strength. Pending.

## 2014-05-25 NOTE — Telephone Encounter (Signed)
Notified pt of approval. 

## 2014-05-25 NOTE — Telephone Encounter (Signed)
Lotrel approved thru 02/24/2015

## 2014-05-30 ENCOUNTER — Ambulatory Visit (INDEPENDENT_AMBULATORY_CARE_PROVIDER_SITE_OTHER): Payer: Commercial Managed Care - HMO | Admitting: Family Medicine

## 2014-05-30 VITALS — BP 126/80 | HR 97 | Temp 97.9°F | Resp 16 | Ht 65.0 in | Wt 256.0 lb

## 2014-05-30 DIAGNOSIS — R5382 Chronic fatigue, unspecified: Secondary | ICD-10-CM | POA: Diagnosis not present

## 2014-05-30 DIAGNOSIS — M1712 Unilateral primary osteoarthritis, left knee: Secondary | ICD-10-CM | POA: Diagnosis not present

## 2014-05-30 DIAGNOSIS — I499 Cardiac arrhythmia, unspecified: Secondary | ICD-10-CM

## 2014-05-30 DIAGNOSIS — R600 Localized edema: Secondary | ICD-10-CM

## 2014-05-30 DIAGNOSIS — R635 Abnormal weight gain: Secondary | ICD-10-CM

## 2014-05-30 DIAGNOSIS — M5440 Lumbago with sciatica, unspecified side: Secondary | ICD-10-CM

## 2014-05-30 LAB — POCT CBC
Granulocyte percent: 76.8 %G (ref 37–80)
HCT, POC: 39.8 % — AB (ref 43.5–53.7)
Hemoglobin: 13.2 g/dL — AB (ref 14.1–18.1)
Lymph, poc: 2 (ref 0.6–3.4)
MCH, POC: 30.9 pg (ref 27–31.2)
MCHC: 33.1 g/dL (ref 31.8–35.4)
MCV: 93.4 fL (ref 80–97)
MID (cbc): 0.1 (ref 0–0.9)
MPV: 6.2 fL (ref 0–99.8)
POC Granulocyte: 7.1 — AB (ref 2–6.9)
POC LYMPH PERCENT: 21.7 %L (ref 10–50)
POC MID %: 1.5 %M (ref 0–12)
Platelet Count, POC: 262 10*3/uL (ref 142–424)
RBC: 4.26 M/uL — AB (ref 4.69–6.13)
RDW, POC: 15.4 %
WBC: 9.2 10*3/uL (ref 4.6–10.2)

## 2014-05-30 MED ORDER — HYDROCODONE-ACETAMINOPHEN 7.5-325 MG PO TABS: 1.0000 | ORAL_TABLET | Freq: Three times a day (TID) | ORAL | Status: DC | PRN

## 2014-05-30 NOTE — Telephone Encounter (Signed)
Pt called and c/o weakness in his legs for the last couple of weeks. He stated he has not fallen, he is very careful and holds on to things, but is afraid he might fall. I advised pt that this could be caused from different things and he should come in for eval. Pt agreed to come to [address] to see Dr L today or tomorrow.

## 2014-05-30 NOTE — Progress Notes (Addendum)
Patient ID: Darin Gonzalez, male   DOB: 04-10-1940, 74 y.o.   MRN: [id number]   This chart was scribed for Darin Haber, MD by [facility], medical scribe at [facility].The patient was seen in exam room 11 and the patient's care was started at 4:48 PM.  Patient ID: Darin Gonzalez MRN: [id number], DOB: 1940/11/09, 74 y.o. Date of Encounter: 05/30/2014  Primary Physician: Darin Haber, MD  Chief Complaint:  Chief Complaint  Patient presents with   Leg Pain    Both/ onset 3-4 months   HPI:  Darin Gonzalez is a 74 y.o. male who presents to [facility] complaining of bilaterally leg pain with associated weakness, onset 3-4 months ago. He denies recent falls. Pt states he has not been eating well lately. He has been eating more "junk food". He says his blood sugar levels have been "pretty good". He is constipated. He does not drink alcohol or smoke cigarettes. He denies leg swelling, chest pain, and SOB. Pt is also here for a medication refill.  Patient's past medical history is significant for obesity, asthma, diabetes, and hypertension.  Home Meds: Prior to Admission medications   Medication Sig Start Date End Date Taking? Authorizing Provider  albuterol (PROVENTIL HFA;VENTOLIN HFA) 108 (90 BASE) MCG/ACT inhaler Inhale 2 puffs into the lungs every 6 (six) hours as needed. 04/20/14  Yes Darin Haber, MD  albuterol (PROVENTIL) (2.5 MG/3ML) 0.083% nebulizer solution Take 3 mLs (2.5 mg total) by nebulization every 8 (eight) hours. 04/06/14  Yes Darin Haber, MD  Alcohol Swabs (B-D SINGLE USE SWABS REGULAR) PADS Test blood sugar 3 times daily due to fluctuating blood sugar. Dx code: E11.9 04/19/14  Yes Darin Haber, MD  amLODipine-benazepril (LOTREL) 5-10 MG per capsule Take 1 capsule by mouth daily. 05/11/14  Yes Darin Haber, MD  Blood Glucose Calibration (ACCU-CHEK SMARTVIEW CONTROL) LIQD Test blood sugar 3 times daily due to fluctuating  blood sugar. Dx code: E11.9 04/19/14  Yes Darin Haber, MD  blood glucose meter kit and supplies KIT Test blood sugar 3 times daily due to fluctuating blood sugar. Dx code: E11.9 04/19/14  Yes Darin Haber, MD  Blood Glucose Monitoring Suppl (ONE TOUCH ULTRA SYSTEM KIT) W/DEVICE KIT 1 kit by Does not apply route 3 (three) times daily. 08/15/13  Yes Darin Haber, MD  clonazePAM (KLONOPIN) 1 MG tablet Take 1 tablet (1 mg total) by mouth 2 (two) times daily as needed for anxiety. 04/17/14  Yes Darin Haber, MD  esomeprazole (NEXIUM) 40 MG capsule TAKE 1 CAPSULE BY MOUTH EVERY DAY BEFORE BREAKFAST 04/19/14  Yes Darin Haber, MD  fluticasone [facility]) 50 MCG/ACT nasal spray USE 1 SPRAY IN EACH NOSTRIL EVERY DAY 05/11/14  Yes Darin Haber, MD  glucose blood test strip Test blood sugar 3 times daily due to fluctuating blood sugar. Dx code: E11.9 04/19/14  Yes Darin Haber, MD  HYDROcodone-acetaminophen [facility]) 7.5-325 MG per tablet Take 1 tablet by mouth 3 (three) times daily as needed for moderate pain. Fill after Jul 09, 2014 05/11/14  Yes Darin Haber, MD  ibuprofen (ADVIL,MOTRIN) 600 MG tablet TAKE 1 TABLET EVERY 8 HOURS AS NEEDED FOR  PAIN 05/11/14  Yes Darin Haber, MD  Lancets MISC Test blood sugar 3 times daily due to fluctuating blood sugar. Dx code: E11.9 04/19/14  Yes Darin Haber, MD  metFORMIN (GLUCOPHAGE) 1000 MG tablet Take 0.5 tablets (500 mg total) by mouth 2 (two) times daily with a meal. 04/17/14  Yes Darin Haber, MD  pravastatin (PRAVACHOL) 40 MG tablet Take 1 tablet (40 mg total) by mouth daily. 04/17/14  Yes Darin Haber, MD  terbinafine (LAMISIL) 250 MG tablet Take 1 tablet (250 mg total) by mouth daily. 05/11/14  Yes Darin Haber, MD  UNABLE TO FIND NEBULIZER MACHINE. DX CODE: 491.0 04/06/14  Yes Darin Haber, MD  UNABLE TO FIND OXYGEN CONCENTRATOR AND BACK UP O2 TANK. NOCTURNAL USE, 2L. DX CODE: 491.0 04/06/14  Yes Darin Haber, MD   Allergies:    Allergies  Allergen Reactions   Codeine    Sulfonamide Derivatives    History   Social History   Marital Status: Legally Separated    Spouse Name: N/A   Number of Children: N/A   Years of Education: N/A   Occupational History   Not on file.   Social History Main Topics   Smoking status: Former Smoker -- 2.00 packs/day for 20 years    Types: Cigarettes    Quit date: 05/02/1979   Smokeless tobacco: Current User    Types: Chew   Alcohol Use: No   Drug Use: No   Sexual Activity: No   Other Topics Concern   Not on file   Social History Narrative    Review of Systems: Constitutional: negative for chills, fever, night sweats, weight changes, or fatigue  HEENT: negative for vision changes, hearing loss, congestion, rhinorrhea, ST, epistaxis, or sinus pressure Cardiovascular: negative for chest pain or palpitations Respiratory: negative for hemoptysis, wheezing, shortness of breath, or cough Abdominal: negative for abdominal pain, nausea, vomiting, diarrhea. Positive for constipation. Dermatological: negative for rash Neurologic: negative for headache, dizziness, or syncope All other systems reviewed and are otherwise negative with the exception to those above and in the HPI.  Physical Exam: Blood pressure 126/80, pulse 97, temperature 97.9 F (36.6 C), temperature source Oral, resp. rate 16, height '5\' 5"'  (1.651 m), weight 256 lb (116.121 kg), SpO2 95 %., Body mass index is 42.6 kg/(m^2). General: Well developed, well nourished, in no acute distress. Head: Normocephalic, atraumatic, eyes without discharge, sclera non-icteric, nares are without discharge. Bilateral auditory canals clear, TM's are without perforation, pearly grey and translucent with reflective cone of light bilaterally. Oral cavity moist, posterior pharynx without exudate, erythema, peritonsillar abscess, or post nasal drip.  Neck: Supple. No thyromegaly. Full ROM. No lymphadenopathy. Lungs: Clear  bilaterally to auscultation without wheezes, rales, or rhonchi. Breathing is unlabored. Heart: Irregular rhythm with S1 S2. No murmurs, rubs, or gallops appreciated, Abdomen: Soft, non-tender, non-distended with normoactive bowel sounds. No hepatomegaly. No rebound/guarding. No obvious abdominal masses. Msk:  Strength and tone normal for age. Extremities/Skin: Warm and dry. No clubbing or cyanosis. No edema. No rashes or suspicious lesions.  Patient has 1+ bilateral edema Neuro: Alert and oriented X 3. Moves all extremities spontaneously. Gait is normal. CNII-XII grossly in tact. Psych:  Responds to questions appropriately with a normal affect.   Labs: Drawn today Results for orders placed or performed in visit on 05/30/14  POCT CBC  Result Value Ref Range   WBC 9.2 4.6 - 10.2 K/uL   Lymph, poc 2.0 0.6 - 3.4   POC LYMPH PERCENT 21.7 10 - 50 %L   MID (cbc) 0.1 0 - 0.9   POC MID % 1.5 0 - 12 %M   POC Granulocyte 7.1 (A) 2 - 6.9   Granulocyte percent 76.8 37 - 80 %G   RBC 4.26 (A) 4.69 - 6.13 M/uL   Hemoglobin 13.2 (A) 14.1 -  18.1 g/dL   HCT, POC 39.8 (A) 43.5 - 53.7 %   MCV 93.4 80 - 97 fL   MCH, POC 30.9 27 - 31.2 pg   MCHC 33.1 31.8 - 35.4 g/dL   RDW, POC 15.4 %   Platelet Count, POC 262 142 - 424 K/uL   MPV 6.2 0 - 99.8 fL    EKG: Right bundle branch block, regular rhythm  ASSESSMENT AND PLAN:  74 y.o. year old male with intermittently This chart was scribed in my presence and reviewed by me personally.    ICD-9-CM ICD-10-CM   1. Chronic fatigue 780.79 R53.82 Comprehensive metabolic panel     EKG 19-ERDE     POCT CBC  2. Irregular heartbeat 427.9 I49.9 POCT CBC  3. Weight gain 783.1 R63.5   4. Pedal edema 782.3 R60.0   5. Primary osteoarthritis of left knee 715.16 M17.12 HYDROcodone-acetaminophen (NORCO) 7.5-325 MG per tablet  6. Low back pain with sciatica, sciatica laterality unspecified, unspecified back pain laterality 724.3 M54.40 HYDROcodone-acetaminophen (NORCO)  7.5-325 MG per tablet     Signed, Darin Haber, MD     Signed, Darin Haber, MD 05/30/2014 5:32 PM

## 2014-05-31 LAB — COMPREHENSIVE METABOLIC PANEL
ALT: <8 U/L (ref 0–53)
AST: 17 U/L (ref 0–37)
Albumin: 4 g/dL (ref 3.5–5.2)
Alkaline Phosphatase: 48 U/L (ref 39–117)
BUN: 9 mg/dL (ref 6–23)
CO2: 25 mEq/L (ref 19–32)
Calcium: 8.9 mg/dL (ref 8.4–10.5)
Chloride: 100 mEq/L (ref 96–112)
Creat: 1.17 mg/dL (ref 0.50–1.35)
Glucose, Bld: 116 mg/dL — ABNORMAL HIGH (ref 70–99)
Potassium: 4.4 mEq/L (ref 3.5–5.3)
Sodium: 137 mEq/L (ref 135–145)
Total Bilirubin: 0.8 mg/dL (ref 0.2–1.2)
Total Protein: 6.6 g/dL (ref 6.0–8.3)

## 2014-06-02 ENCOUNTER — Telehealth: Payer: Self-pay

## 2014-06-02 NOTE — Telephone Encounter (Signed)
Completed form and faxed back to [facility] w/last OV notes. Advised pt of status and verified that he does use a nasal cannula, not mask.

## 2014-06-02 NOTE — Telephone Encounter (Signed)
Called [facility] to find out what is needed. Advised that we need to fax in a Rx (they are going to fax a form) and fax it back with OV notes showing pt has been to see provider recently - does not have to be visit for resp issues.

## 2014-06-02 NOTE — Telephone Encounter (Signed)
Patient is calling because he got oxygen [facility] and states that they need us to send them a prescription so that he doesn't have to pay the pull price. Patient phone: [phone]

## 2014-06-05 ENCOUNTER — Other Ambulatory Visit: Payer: Self-pay | Admitting: Family Medicine

## 2014-06-06 ENCOUNTER — Other Ambulatory Visit: Payer: Self-pay

## 2014-06-06 DIAGNOSIS — F411 Generalized anxiety disorder: Secondary | ICD-10-CM

## 2014-06-06 NOTE — Telephone Encounter (Signed)
[employer] mail order sent req for RF of clonazepam. Fax stated that the pt's Rx does not have any refills on it and reqs we send in a RF. The patient should not be out of this until about 07/16/14, but [employer] is probably requesting it so early because they are a mail order. Do you want to send in RF w/date on it that it can be mailed out to pt? Last RF for 90 days was on 04/17/14.

## 2014-06-07 MED ORDER — CLONAZEPAM 1 MG PO TABS: 1.0000 mg | ORAL_TABLET | Freq: Two times a day (BID) | ORAL | Status: DC | PRN

## 2014-06-08 NOTE — Telephone Encounter (Signed)
Faxed

## 2014-06-09 DIAGNOSIS — J410 Simple chronic bronchitis: Secondary | ICD-10-CM | POA: Diagnosis not present

## 2014-06-09 DIAGNOSIS — J4520 Mild intermittent asthma, uncomplicated: Secondary | ICD-10-CM | POA: Diagnosis not present

## 2014-06-16 ENCOUNTER — Telehealth: Payer: Self-pay

## 2014-06-16 NOTE — Telephone Encounter (Signed)
Pt wanted me to know he is changing his [facility] and dropping [employer] that he has had so much trouble with. [facility]. He does not need anything now.

## 2014-06-16 NOTE — Telephone Encounter (Signed)
Pt wants [name] to call him back concerning a medication refill. Please advise at [phone]

## 2014-06-21 ENCOUNTER — Other Ambulatory Visit: Payer: Self-pay

## 2014-06-21 MED ORDER — ALBUTEROL SULFATE HFA 108 (90 BASE) MCG/ACT IN AERS: 2.0000 | INHALATION_SPRAY | Freq: Four times a day (QID) | RESPIRATORY_TRACT | Status: DC | PRN

## 2014-06-23 ENCOUNTER — Other Ambulatory Visit: Payer: Self-pay

## 2014-06-23 DIAGNOSIS — F411 Generalized anxiety disorder: Secondary | ICD-10-CM

## 2014-06-23 NOTE — Telephone Encounter (Signed)
Patient is calling to request a [facility]. Patient also wants [name] to know that [facility].

## 2014-06-25 NOTE — Telephone Encounter (Signed)
Ok to refill clonazepam for one month (I am out of town for another day)

## 2014-06-27 MED ORDER — CLONAZEPAM 1 MG PO TABS: 1.0000 mg | ORAL_TABLET | Freq: Two times a day (BID) | ORAL | Status: DC | PRN

## 2014-06-27 NOTE — Telephone Encounter (Signed)
I authorized 1 mos RF as phone in per Dr L's inst's, but then realized that pt had [facility]. Called pt who stated that he would rather wait for Dr L to get back to office tomorrow and see if he will send in a 90 day since it is to mail order. I am pending the 3 mos supply and did NOT call in the 1 mos that was written electronically.

## 2014-06-28 NOTE — Telephone Encounter (Signed)
[facility] and notified pt.

## 2014-06-30 ENCOUNTER — Telehealth: Payer: Self-pay

## 2014-06-30 NOTE — Telephone Encounter (Signed)
Pt called and reported that his policy w/[employer] was cancelled as of April 30, and his [facility] plan is [facility] and [facility]xs [facility]. He needs his clonazepam Rx to go to [facility] because [facility] will not fill w/out ins. Called and cancelled at [facility] to make sure it doesn't ship and called in to [facility] as written 06/27/14.

## 2014-07-04 ENCOUNTER — Other Ambulatory Visit: Payer: Self-pay

## 2014-07-04 DIAGNOSIS — E119 Type 2 diabetes mellitus without complications: Secondary | ICD-10-CM

## 2014-07-04 DIAGNOSIS — E785 Hyperlipidemia, unspecified: Secondary | ICD-10-CM

## 2014-07-04 MED ORDER — PRAVASTATIN SODIUM 40 MG PO TABS: 40.0000 mg | ORAL_TABLET | Freq: Every day | ORAL | Status: DC

## 2014-07-04 MED ORDER — IBUPROFEN 600 MG PO TABS: ORAL_TABLET | ORAL | Status: DC

## 2014-07-04 MED ORDER — LOTREL 5-20 MG PO CAPS: 1.0000 | ORAL_CAPSULE | Freq: Every day | ORAL | Status: DC

## 2014-07-04 MED ORDER — GLUCOSE BLOOD VI STRP: ORAL_STRIP | Status: DC

## 2014-07-04 MED ORDER — METFORMIN HCL 1000 MG PO TABS: 500.0000 mg | ORAL_TABLET | Freq: Two times a day (BID) | ORAL | Status: DC

## 2014-07-04 NOTE — Telephone Encounter (Signed)
Pt stated that [facility] and needs [facility]xs [facility]

## 2014-07-05 ENCOUNTER — Telehealth: Payer: Self-pay

## 2014-07-05 NOTE — Telephone Encounter (Signed)
Pt called and reported that since his ins changed, [facility] needs a PA done on brand Lotrel before it will cover it. We don't have a copy of pt's new ins card, so I had to call pt back to get his [facility] ID # which is # [id number]. Completed PA on covermymeds. Pending.

## 2014-07-08 ENCOUNTER — Other Ambulatory Visit: Payer: Self-pay | Admitting: Family Medicine

## 2014-07-08 NOTE — Telephone Encounter (Signed)
PA for Lotrel 5-20mg  is approved

## 2014-07-09 DIAGNOSIS — J410 Simple chronic bronchitis: Secondary | ICD-10-CM | POA: Diagnosis not present

## 2014-07-09 DIAGNOSIS — J4520 Mild intermittent asthma, uncomplicated: Secondary | ICD-10-CM | POA: Diagnosis not present

## 2014-07-10 ENCOUNTER — Other Ambulatory Visit: Payer: Self-pay

## 2014-07-10 NOTE — Telephone Encounter (Signed)
Pt was prescribed LOTREL by Dr. Elbert EwingsL. Pt states that at his last visit Dr. Elbert EwingsL decided to decrease the dosage from 5-20mg  to 5-10 mg. However, pt just received a 90 day supply via mail with the LOTREL 5-20mg . Pt wants to clarify whether Dr. Elbert EwingsL wanted 5-10mg  or 5-20mg .

## 2014-07-10 NOTE — Telephone Encounter (Signed)
As I explained to [name] at the visit, he can continue his current dose until it runs out, then switch to the lower dose and monitor the BP weekly to make sure the new dose is sufficient to maintain BP below 140/90

## 2014-07-11 NOTE — Telephone Encounter (Signed)
I am sorry for the confusion. I want [name] to remain on the lower of the 2 doses (Lotrel 5-10 milligrams).  The higher dose tends to result in more swelling in his blood pressure has been stable.  Let me know if I need to call and the lower dose to his pharmacy

## 2014-07-11 NOTE — Telephone Encounter (Signed)
Confused. You wrote him for the Lotrel 5-20, not 5-10.

## 2014-07-12 MED ORDER — LOTREL 5-10 MG PO CAPS: 1.0000 | ORAL_CAPSULE | Freq: Every day | ORAL | Status: DC

## 2014-07-12 MED ORDER — UNABLE TO FIND: Status: AC

## 2014-07-12 MED ORDER — AMLODIPINE BESY-BENAZEPRIL HCL 5-10 MG PO CAPS: 1.0000 | ORAL_CAPSULE | Freq: Every day | ORAL | Status: DC

## 2014-07-12 NOTE — Telephone Encounter (Signed)
Pt CB and I discussed that he should stay on lower dose and I will send in RFs of the Lotrel 5-10.  E-Rxd and called [facility] and asked them to fill with Name brand only.  Pt would also like Rxs for a wrist BP monitor and a finger O2 sat device. I have pended both to be printed. Advised pt to check with [facility] and see if they carry both devices. Dr L, please advise/sign if approved.

## 2014-07-12 NOTE — Telephone Encounter (Signed)
Left message for pt to call back  °

## 2014-07-13 ENCOUNTER — Telehealth: Payer: Self-pay

## 2014-07-13 NOTE — Telephone Encounter (Signed)
Pt called and gave me #s that [facility] told him to have us call to have his pulse oximeter and BP monitor sent to him. I called # for pulse oximeter and [facility] and they stated they do not supply equipment, pt would have to go through one of the suppliers who are in network for plan. Called other # for BP monitor and it was [facility]. They do have them (but do not have the pulse oximeters also) but need pt to call them and set up acct. Then they will send us an order to sign for the BP monitor. Called pt to advise.

## 2014-07-13 NOTE — Telephone Encounter (Signed)
Notified pt that Rxs for BP and O2 sat monitors are ready and pt asked us to mail them, which I will do.

## 2014-07-13 NOTE — Telephone Encounter (Signed)
[facility] pharm called to clarify directions on test strips that Dr L had approved on order form. Knowing this pt well and that he normally prefers to get strips at reg pharm, I called pt and he did advise that he does not wish to order from [facility], but continue to get strips where he has been. Advised pharm and they will cancel order.

## 2014-07-14 DIAGNOSIS — E119 Type 2 diabetes mellitus without complications: Secondary | ICD-10-CM | POA: Diagnosis not present

## 2014-07-14 NOTE — Telephone Encounter (Signed)
Received order [facility] for BP monitor [facility]. Completed and faxed. Tagged for scanning.

## 2014-07-18 ENCOUNTER — Telehealth: Payer: Self-pay

## 2014-07-18 NOTE — Telephone Encounter (Signed)
[facility] called for order of test strips. I had just talked w/pt about this when I received a req from another company and he stated he will just keep getting his DM supplies from [facility] or locally. Advised [facility] that pt will have to call us himself if he wishes to order from them.

## 2014-07-19 ENCOUNTER — Telehealth: Payer: Self-pay

## 2014-07-19 ENCOUNTER — Other Ambulatory Visit: Payer: Self-pay | Admitting: Family Medicine

## 2014-07-19 NOTE — Telephone Encounter (Signed)
Called pt and advised that as I had told him the other day I could not find out where to order a pulse oximeter that would be covered by his ins, but have sent in the order for BP monitor to [facility]. I gave him # to call to check status of order and suggested pt call ins to see where he can get a pulse oximeter that would be covered.

## 2014-07-19 NOTE — Telephone Encounter (Signed)
Patient is calling again to speak with [place]. He states he's not trying to worry anyone but his insurance won't cover his prescription for the blood pressure monitor and the pulse optimeter and he doesn't know what to do. Please call patient! [phone]

## 2014-07-19 NOTE — Telephone Encounter (Signed)
Pt would like to speak with [name] about medication issues. Please advise at

## 2014-07-20 ENCOUNTER — Telehealth: Payer: Self-pay

## 2014-07-20 ENCOUNTER — Ambulatory Visit: Payer: Commercial Managed Care - HMO | Admitting: Family Medicine

## 2014-07-20 NOTE — Telephone Encounter (Signed)
Patient called today and asked for [facility]. Please call!

## 2014-07-20 NOTE — Telephone Encounter (Signed)
Patient called today and asked for [name]. Please call! °

## 2014-07-21 NOTE — Telephone Encounter (Signed)
LMOM for pt to CB.  

## 2014-07-21 NOTE — Telephone Encounter (Signed)
Pt called to give me the good news that he is able to get his BP monitor directly from [facility] using "points" that he has accumulated. He doesn't have quite enough for pulse oximeter yet but should be able to get it soon also.

## 2014-07-26 DIAGNOSIS — J4520 Mild intermittent asthma, uncomplicated: Secondary | ICD-10-CM | POA: Diagnosis not present

## 2014-07-26 DIAGNOSIS — J410 Simple chronic bronchitis: Secondary | ICD-10-CM | POA: Diagnosis not present

## 2014-07-27 ENCOUNTER — Telehealth: Payer: Self-pay

## 2014-07-27 NOTE — Telephone Encounter (Signed)
Darin MarshallJimmy would like Darin MccreedyBarbara to give him a CB. He is having problems with his blood pressure monitor. Please advise at [phone]

## 2014-07-28 NOTE — Telephone Encounter (Signed)
Called pt who reported that his BP monitor is not working properly , but that the ins is going to take it back and send him another one.

## 2014-08-09 DIAGNOSIS — J4520 Mild intermittent asthma, uncomplicated: Secondary | ICD-10-CM | POA: Diagnosis not present

## 2014-08-14 ENCOUNTER — Other Ambulatory Visit: Payer: Self-pay

## 2014-08-14 DIAGNOSIS — M1712 Unilateral primary osteoarthritis, left knee: Secondary | ICD-10-CM

## 2014-08-14 DIAGNOSIS — M5440 Lumbago with sciatica, unspecified side: Secondary | ICD-10-CM

## 2014-08-14 MED ORDER — HYDROCODONE-ACETAMINOPHEN 7.5-325 MG PO TABS: 1.0000 | ORAL_TABLET | Freq: Three times a day (TID) | ORAL | Status: DC | PRN

## 2014-08-14 NOTE — Telephone Encounter (Signed)
Pt called and LM on my VM asking me to req a Rf of his hydrocodone from Dr L, and then mail it to him. Pended RF for review.

## 2014-08-15 NOTE — Telephone Encounter (Signed)
Mailed Rx and notified pt.

## 2014-08-16 ENCOUNTER — Telehealth: Payer: Self-pay

## 2014-08-16 MED ORDER — ALBUTEROL SULFATE HFA 108 (90 BASE) MCG/ACT IN AERS: 2.0000 | INHALATION_SPRAY | Freq: Four times a day (QID) | RESPIRATORY_TRACT | Status: DC | PRN

## 2014-08-16 NOTE — Telephone Encounter (Signed)
Pt LM on my VM asking for Rx to be sent to [facility] for ProAir if possible instead of Ventolin, stating that it seemed to work better for him than the Ventolin. Sent RX, notified pt.

## 2014-08-21 ENCOUNTER — Other Ambulatory Visit: Payer: Self-pay | Admitting: Family Medicine

## 2014-08-22 ENCOUNTER — Telehealth: Payer: Self-pay

## 2014-08-22 NOTE — Telephone Encounter (Signed)
Pt CB and stated that he doesn't know of anything that he is almost out of and not sure why [facility] told him he should get in touch with me. I reported that they normally send reqs for RFs when needed, but to CB if he finds that there is a problem with a RF. Pt agreed.

## 2014-08-22 NOTE — Telephone Encounter (Signed)
Pt called and LM on my VM asking me to call him about a Rx from [facility]. Called and LM to CB.

## 2014-08-24 ENCOUNTER — Other Ambulatory Visit: Payer: Self-pay

## 2014-08-24 DIAGNOSIS — F411 Generalized anxiety disorder: Secondary | ICD-10-CM

## 2014-08-24 NOTE — Telephone Encounter (Signed)
Dr Elbert EwingsL, [facility] is requesting a 90 day RF of clonazepam. It is not due until about 09/27/14 but they are probably asking early since it's mail order. Do you want to RF or wait?

## 2014-08-27 MED ORDER — CLONAZEPAM 1 MG PO TABS
1.0000 mg | ORAL_TABLET | Freq: Two times a day (BID) | ORAL | Status: DC | PRN
Start: 2014-08-27 — End: 2014-11-01

## 2014-08-27 NOTE — Progress Notes (Unsigned)
Okay to refill clonazepam for 3 months 

## 2014-09-01 NOTE — Telephone Encounter (Signed)
Pt called and LM that [facility] doesn't have his clonazepam Rx. Called [facility] pharm and was told that they do not have the Rx written 08/27/14. Gave order verbally and notified pt.

## 2014-09-08 DIAGNOSIS — J4520 Mild intermittent asthma, uncomplicated: Secondary | ICD-10-CM | POA: Diagnosis not present

## 2014-09-13 ENCOUNTER — Other Ambulatory Visit: Payer: Self-pay | Admitting: Family Medicine

## 2014-09-16 ENCOUNTER — Telehealth: Payer: Self-pay

## 2014-09-16 ENCOUNTER — Other Ambulatory Visit: Payer: Self-pay | Admitting: Family Medicine

## 2014-09-16 NOTE — Telephone Encounter (Signed)
Called pt to let him know that Dr. Milus Glazier signed his paperwork for handicap placard. Pt stated he would like paperwork mailed to him because he did not have transportation. I.Darin Gonzalez, CMA

## 2014-09-22 ENCOUNTER — Other Ambulatory Visit: Payer: Self-pay

## 2014-09-22 DIAGNOSIS — M1712 Unilateral primary osteoarthritis, left knee: Secondary | ICD-10-CM

## 2014-09-22 DIAGNOSIS — M5440 Lumbago with sciatica, unspecified side: Secondary | ICD-10-CM

## 2014-09-22 NOTE — Telephone Encounter (Signed)
I cannot refill this when I'm out of town.  I have tried referring him to pain management.  I don't know where we stand with that.  I would refill this month by month until he has a pain management specialist.

## 2014-09-22 NOTE — Telephone Encounter (Signed)
Pt called to req a RF of his hydrocodone be mailed to his home as usual. He is not out yet, but it normally takes about 5 days for Rx to arrive in the mail.

## 2014-09-23 NOTE — Telephone Encounter (Signed)
Can someone else write this in Dr Cain Saupe absence? It looks like only referrals are old, from 2014. I'll send this to Referrals to see if they can determine what happened back then and maybe we can refer again.

## 2014-09-25 MED ORDER — HYDROCODONE-ACETAMINOPHEN 7.5-325 MG PO TABS: 1.0000 | ORAL_TABLET | Freq: Three times a day (TID) | ORAL | Status: DC | PRN

## 2014-09-25 NOTE — Telephone Encounter (Signed)
I will refill for Dr. Milus Glazier in his absence. Per the [place] database, he is compliant and has only gotten rx filled from Dr. Elbert Ewings. Please check on the status on his pain management. This medication poses increased risks for his age group and should be managed by pain clinic. If a new referral needs to be ordered please let me know. Thank you!

## 2014-09-25 NOTE — Telephone Encounter (Signed)
Notified pt that Rx was mailed. He also reported to me that he has received the handicapped placard that sent. I spoke to pt about pain management, and he stated that he has not ever been to one before. He is hesitant because it is difficult for him to get transportation. Pt agreed to me putting in a referral when I explained that the government does not want PCPs Rxing chronic narcotic pain meds. I advised it will take some time to get an appt most likely and he can discuss with the pain clinic when he would able to get a ride for an appt. Put in referral as req'd by Dr Elbert Ewings (and Kathlene November).

## 2014-10-03 ENCOUNTER — Other Ambulatory Visit: Payer: Self-pay | Admitting: Family Medicine

## 2014-10-05 ENCOUNTER — Telehealth: Payer: Self-pay

## 2014-10-05 NOTE — Telephone Encounter (Signed)
We received fax from [employer] for [employer] Prior Approval for O2, but it had a providers name that doesn't practice here Oswaldo Milian Datar). I called pt and he is not familiar w/this provider either. I faxed back to [place] w/this info.

## 2014-10-09 DIAGNOSIS — J4520 Mild intermittent asthma, uncomplicated: Secondary | ICD-10-CM | POA: Diagnosis not present

## 2014-10-10 DIAGNOSIS — E119 Type 2 diabetes mellitus without complications: Secondary | ICD-10-CM | POA: Diagnosis not present

## 2014-10-11 ENCOUNTER — Other Ambulatory Visit: Payer: Self-pay

## 2014-10-11 MED ORDER — ALBUTEROL SULFATE HFA 108 (90 BASE) MCG/ACT IN AERS: INHALATION_SPRAY | RESPIRATORY_TRACT | Status: DC

## 2014-10-28 ENCOUNTER — Other Ambulatory Visit: Payer: Self-pay

## 2014-10-28 DIAGNOSIS — M5440 Lumbago with sciatica, unspecified side: Secondary | ICD-10-CM

## 2014-10-28 DIAGNOSIS — M1712 Unilateral primary osteoarthritis, left knee: Secondary | ICD-10-CM

## 2014-10-28 NOTE — Telephone Encounter (Signed)
Pt needs a refill on HYDROcodone-acetaminophen (NORCO) 7.5-325 MG. He wants to pick it prescription here on  Monday so Dr. Elbert Ewings can approve.  Please advise [phone]

## 2014-11-01 ENCOUNTER — Other Ambulatory Visit: Payer: Self-pay

## 2014-11-01 DIAGNOSIS — F411 Generalized anxiety disorder: Secondary | ICD-10-CM

## 2014-11-01 NOTE — Telephone Encounter (Signed)
[facility] faxed req for RF of clonazepam. Last sent 90 day supply 08/27/14, so asking a month early, but probably due to being mail order and making time to arrive.

## 2014-11-02 MED ORDER — CLONAZEPAM 1 MG PO TABS: 1.0000 mg | ORAL_TABLET | Freq: Two times a day (BID) | ORAL | Status: DC | PRN

## 2014-11-02 MED ORDER — HYDROCODONE-ACETAMINOPHEN 7.5-325 MG PO TABS: 1.0000 | ORAL_TABLET | Freq: Three times a day (TID) | ORAL | Status: DC | PRN

## 2014-11-02 NOTE — Telephone Encounter (Signed)
Pt called to check status of this refill because he is heading out of town again to help a family member who is ill. He would like to pick it up tonight. Please have asst or TL put the Rx in drawer along with the form already in envelope ready for p/up, and call the pt to let him know it is ready.

## 2014-11-03 NOTE — Telephone Encounter (Signed)
Faxed

## 2014-11-03 NOTE — Telephone Encounter (Signed)
Notified pt ready. 

## 2014-11-08 ENCOUNTER — Other Ambulatory Visit: Payer: Self-pay | Admitting: Family Medicine

## 2014-11-09 DIAGNOSIS — J4520 Mild intermittent asthma, uncomplicated: Secondary | ICD-10-CM | POA: Diagnosis not present

## 2014-11-09 NOTE — Telephone Encounter (Signed)
Checked w/pt and advised he is due for f/up within the month and I can only send in a 30 day supply of his RF to [facility]. Pt stated that he would like for me to send it for now, and he will plan to come in.

## 2014-11-28 ENCOUNTER — Other Ambulatory Visit: Payer: Self-pay

## 2014-11-28 DIAGNOSIS — M1712 Unilateral primary osteoarthritis, left knee: Secondary | ICD-10-CM

## 2014-11-28 DIAGNOSIS — M5440 Lumbago with sciatica, unspecified side: Secondary | ICD-10-CM

## 2014-11-28 NOTE — Telephone Encounter (Signed)
Pt called to get Dr Cain Saupe schedule next week. He has found someone to bring him for follow up and will plan to come in on Friday 10/14. Pt would like a RF of hydrocodone mailed to him first if possible, because that will be 4 days after it is due to be RFd. I have pended RF for review.

## 2014-11-29 MED ORDER — HYDROCODONE-ACETAMINOPHEN 7.5-325 MG PO TABS: 1.0000 | ORAL_TABLET | Freq: Three times a day (TID) | ORAL | Status: DC | PRN

## 2014-11-30 NOTE — Telephone Encounter (Signed)
Erin mailed this Rx yesterday. Called pt to notify.

## 2014-12-09 DIAGNOSIS — J4520 Mild intermittent asthma, uncomplicated: Secondary | ICD-10-CM | POA: Diagnosis not present

## 2014-12-26 ENCOUNTER — Other Ambulatory Visit: Payer: Self-pay

## 2014-12-26 DIAGNOSIS — M1712 Unilateral primary osteoarthritis, left knee: Secondary | ICD-10-CM

## 2014-12-26 DIAGNOSIS — M5440 Lumbago with sciatica, unspecified side: Secondary | ICD-10-CM

## 2014-12-26 NOTE — Telephone Encounter (Signed)
Pt called to ask if we have received the notes from Insurance RN visit last week. They were supposed to send results to Dr L. I advised that I don't see anything in the computer yet, but Dr L may have received it and sent to be scanned after review. Dr L, have you received anything yet?  Pt also wanted to req RF of hydrocodone.

## 2014-12-27 ENCOUNTER — Other Ambulatory Visit: Payer: Self-pay | Admitting: Family Medicine

## 2014-12-27 MED ORDER — HYDROCODONE-ACETAMINOPHEN 7.5-325 MG PO TABS: 1.0000 | ORAL_TABLET | Freq: Three times a day (TID) | ORAL | Status: DC | PRN

## 2014-12-28 NOTE — Telephone Encounter (Signed)
Pt called back to check if we have received the faxed notes from his insurance Rn visit yet. Dr L and/or Med Recs, have you seen these reports come in by fax yet?   I mailed pt's hydrocodone Rx to him as req'd.

## 2014-12-29 ENCOUNTER — Other Ambulatory Visit: Payer: Self-pay

## 2014-12-29 DIAGNOSIS — F411 Generalized anxiety disorder: Secondary | ICD-10-CM

## 2014-12-29 NOTE — Telephone Encounter (Signed)
[facility] is requesting RF of clonazepam. Pt should have enough through first week of Dec, but this mail order normally requests RFs about a month ahead of time.

## 2014-12-30 ENCOUNTER — Telehealth: Payer: Self-pay

## 2014-12-30 MED ORDER — CLONAZEPAM 1 MG PO TABS: 1.0000 mg | ORAL_TABLET | Freq: Two times a day (BID) | ORAL | Status: DC | PRN

## 2014-12-30 NOTE — Telephone Encounter (Signed)
Pt called asking if Rx was ready/has been sent out in the mail... Based on review of chart Klonopin 1 mg and HydroCodone-Acetate 7.5-325 mg have been ordered/printed... Klonopin is sitting in Rx pick up drawer but patient says [facility]... His expectation is that HydroCodone-Acet. 7.5-325 Mg is to be sent to him due to lack of transportation.   Please contact to advise [phone]

## 2015-01-01 NOTE — Telephone Encounter (Signed)
Called pt and advised that I mailed his hydrocodone Rx out on the 3rd, and the clonazepam Rx was written on Sat 5th. I advised that the weekend staff should have faxed to [facility], but pt agreed to check with them and let us know if they don't have it.

## 2015-01-03 ENCOUNTER — Encounter: Payer: Self-pay | Admitting: Family Medicine

## 2015-01-09 DIAGNOSIS — J4520 Mild intermittent asthma, uncomplicated: Secondary | ICD-10-CM | POA: Diagnosis not present

## 2015-01-10 DIAGNOSIS — E119 Type 2 diabetes mellitus without complications: Secondary | ICD-10-CM | POA: Diagnosis not present

## 2015-01-12 ENCOUNTER — Other Ambulatory Visit: Payer: Self-pay | Admitting: Family Medicine

## 2015-01-22 ENCOUNTER — Other Ambulatory Visit: Payer: Self-pay | Admitting: Family Medicine

## 2015-01-22 NOTE — Telephone Encounter (Signed)
[facility] is requesting 90 day RFs of medication. Called pt to advise he is due for f/up OV and he agrees to RTC as soon as he can get here. Dr L, is it OK to RF for 90 days?

## 2015-01-24 ENCOUNTER — Other Ambulatory Visit: Payer: Self-pay

## 2015-01-24 DIAGNOSIS — M5440 Lumbago with sciatica, unspecified side: Secondary | ICD-10-CM

## 2015-01-24 DIAGNOSIS — M1712 Unilateral primary osteoarthritis, left knee: Secondary | ICD-10-CM

## 2015-01-24 MED ORDER — METFORMIN HCL 1000 MG PO TABS: 500.0000 mg | ORAL_TABLET | Freq: Two times a day (BID) | ORAL | Status: DC

## 2015-01-24 MED ORDER — IBUPROFEN 600 MG PO TABS: 600.0000 mg | ORAL_TABLET | Freq: Three times a day (TID) | ORAL | Status: DC | PRN

## 2015-01-24 MED ORDER — PRAVASTATIN SODIUM 40 MG PO TABS: 40.0000 mg | ORAL_TABLET | Freq: Every day | ORAL | Status: DC

## 2015-01-24 NOTE — Telephone Encounter (Signed)
Pt called to req Rf of his hydrocodone be mailed to him. He wanted to let Dr L know that he will be in to see him sometime w/in a month when he can get a ride lined up.

## 2015-01-25 MED ORDER — HYDROCODONE-ACETAMINOPHEN 7.5-325 MG PO TABS
1.0000 | ORAL_TABLET | Freq: Three times a day (TID) | ORAL | Status: DC | PRN
Start: 2015-01-25 — End: 2015-03-06

## 2015-01-29 ENCOUNTER — Telehealth: Payer: Self-pay

## 2015-01-29 NOTE — Telephone Encounter (Signed)
Pt stated that Rx was supposed to be mailed to his home address...  HYDROcodone-acetaminophen (NORCO) 7.5-325 MG tablet  As of 01/25/15 Rx has been printed... Not in pick up drawer.... No messages indicate that the Rx has been sent to home address... (pt [facility])   Pt would like to come by 12/06 for pick up...   [phone]

## 2015-01-31 NOTE — Telephone Encounter (Signed)
I have been looking for this Rx ever since it was printed and could not locate. I am hoping that Dr Cain SaupeL's asst or TL mailed it to pt as requested in original message (RF req from 11/30). No one documented what was done with the Rx so I have no way of knowing. I had Esaw GrandchildKarysha advise pt to call me back if it does not arrive in the mail w/in next day or two. Dr L, do you know what happened to this Rx after you printed it?

## 2015-02-08 DIAGNOSIS — J4520 Mild intermittent asthma, uncomplicated: Secondary | ICD-10-CM | POA: Diagnosis not present

## 2015-02-10 ENCOUNTER — Telehealth: Payer: Self-pay

## 2015-02-10 ENCOUNTER — Other Ambulatory Visit: Payer: Self-pay | Admitting: Family Medicine

## 2015-02-10 NOTE — Telephone Encounter (Signed)
PATIENT WOULD LIKE [name] AND DR. Milus GlazierLAUENSTEIN TO KNOW THAT HE NEEDS REFILLS ON 5 OF HIS MEDICATIONS. 1. IBUPROFEN 600 MG   2. PRO AIR HFA   3. CLONAZEPAM 1 MG   4. METFORMIN 1000 MG   5. PRAVASTATIN 40 MG. PLEASE CALL HIM WHEN IT HAS BEEN DONE. BEST PHONE [phone]  PHARMACY CHOICE IS [facility]   MBC

## 2015-02-12 NOTE — Telephone Encounter (Signed)
Advise on Clonazepam. 

## 2015-02-14 ENCOUNTER — Other Ambulatory Visit: Payer: Self-pay

## 2015-02-14 ENCOUNTER — Other Ambulatory Visit: Payer: Self-pay | Admitting: Family Medicine

## 2015-02-14 DIAGNOSIS — F411 Generalized anxiety disorder: Secondary | ICD-10-CM

## 2015-02-14 MED ORDER — PRAVASTATIN SODIUM 40 MG PO TABS: 40.0000 mg | ORAL_TABLET | Freq: Every day | ORAL | Status: DC

## 2015-02-14 MED ORDER — CLONAZEPAM 1 MG PO TABS: 1.0000 mg | ORAL_TABLET | Freq: Two times a day (BID) | ORAL | Status: DC | PRN

## 2015-02-14 MED ORDER — METFORMIN HCL 1000 MG PO TABS: 500.0000 mg | ORAL_TABLET | Freq: Two times a day (BID) | ORAL | Status: DC

## 2015-02-14 MED ORDER — FLUTICASONE PROPIONATE 50 MCG/ACT NA SUSP: NASAL | Status: DC

## 2015-02-14 MED ORDER — IBUPROFEN 600 MG PO TABS: 600.0000 mg | ORAL_TABLET | Freq: Three times a day (TID) | ORAL | Status: DC | PRN

## 2015-02-14 MED ORDER — ALBUTEROL SULFATE HFA 108 (90 BASE) MCG/ACT IN AERS: 2.0000 | INHALATION_SPRAY | Freq: Four times a day (QID) | RESPIRATORY_TRACT | Status: DC | PRN

## 2015-02-14 NOTE — Telephone Encounter (Signed)
Faxed clonazepam to [facility]. Called pt and let him know all meds were sent.

## 2015-02-27 ENCOUNTER — Telehealth: Payer: Self-pay

## 2015-02-27 DIAGNOSIS — M1712 Unilateral primary osteoarthritis, left knee: Secondary | ICD-10-CM

## 2015-02-27 DIAGNOSIS — M5440 Lumbago with sciatica, unspecified side: Secondary | ICD-10-CM

## 2015-02-27 NOTE — Telephone Encounter (Signed)
Darin MccreedyBarbara - Pt will be coming in Thursday to be seen and wants to make sure his refill will be ready.  [phone]

## 2015-02-27 NOTE — Telephone Encounter (Signed)
Advised pt that Dr L will not be here this week at all. The times that I had given him for Dr L's schedule were for the following week. Gave pt hours for 1/10 again and he plans to come in then. Advised him that Dr L can get him his RF at time of appt. Pt agreed.

## 2015-03-05 ENCOUNTER — Telehealth: Payer: Self-pay

## 2015-03-05 NOTE — Telephone Encounter (Signed)
Pt called in and reported that he is afraid to get out in the snow to come in to see Dr L tomorrow as planned, and has been out of his hydrocodone since the 7th. Can we go ahead and get him a RF so that he can have a relative come by and p/up for him? He stated he will be in as soon as he feels safe walking in the snow, but he is afraid of slipping and falling.

## 2015-03-05 NOTE — Addendum Note (Signed)
Addended by: Sheppard PlumberBRIGGS, Esaiah Wanless A on: 03/05/2015 11:05 AM   Modules accepted: Orders

## 2015-03-05 NOTE — Telephone Encounter (Signed)
PT called to req. Refill of HYDROcodone-acetaminophen (NORCO) 7.5-325 MG tablet [[id number]][[id number]] (has been out since 03/03/15  [phone] Please call to advise or when ready for pick up

## 2015-03-06 ENCOUNTER — Other Ambulatory Visit: Payer: Self-pay | Admitting: Family Medicine

## 2015-03-06 DIAGNOSIS — M1712 Unilateral primary osteoarthritis, left knee: Secondary | ICD-10-CM

## 2015-03-06 DIAGNOSIS — M5440 Lumbago with sciatica, unspecified side: Secondary | ICD-10-CM

## 2015-03-06 MED ORDER — HYDROCODONE-ACETAMINOPHEN 7.5-325 MG PO TABS: 1.0000 | ORAL_TABLET | Freq: Three times a day (TID) | ORAL | Status: DC | PRN

## 2015-03-06 NOTE — Telephone Encounter (Signed)
Pt called and stated that he has someone who can p/up his Rx today if we can get it ready.

## 2015-03-06 NOTE — Telephone Encounter (Signed)
Duplicate message. See notes on 1/3 message.

## 2015-03-11 DIAGNOSIS — J4520 Mild intermittent asthma, uncomplicated: Secondary | ICD-10-CM | POA: Diagnosis not present

## 2015-03-20 ENCOUNTER — Other Ambulatory Visit: Payer: Self-pay | Admitting: Family Medicine

## 2015-03-23 ENCOUNTER — Telehealth: Payer: Self-pay

## 2015-03-23 NOTE — Telephone Encounter (Signed)
Patient wants Britta Mccreedy to know he will be in to see Dr. Milus Glazier next week.

## 2015-03-28 ENCOUNTER — Ambulatory Visit: Payer: Medicare Other | Admitting: Family Medicine

## 2015-03-28 VITALS — BP 118/68 | HR 84 | Temp 97.4°F | Resp 20 | Ht 65.75 in | Wt 249.0 lb

## 2015-03-28 DIAGNOSIS — E119 Type 2 diabetes mellitus without complications: Secondary | ICD-10-CM

## 2015-03-28 DIAGNOSIS — M5440 Lumbago with sciatica, unspecified side: Secondary | ICD-10-CM

## 2015-03-28 DIAGNOSIS — F411 Generalized anxiety disorder: Secondary | ICD-10-CM

## 2015-03-28 DIAGNOSIS — M1712 Unilateral primary osteoarthritis, left knee: Secondary | ICD-10-CM

## 2015-03-28 DIAGNOSIS — M172 Bilateral post-traumatic osteoarthritis of knee: Secondary | ICD-10-CM | POA: Diagnosis not present

## 2015-03-28 MED ORDER — METFORMIN HCL 1000 MG PO TABS: 500.0000 mg | ORAL_TABLET | Freq: Two times a day (BID) | ORAL | Status: DC

## 2015-03-28 MED ORDER — HYDROCODONE-ACETAMINOPHEN 7.5-325 MG PO TABS: 1.0000 | ORAL_TABLET | Freq: Three times a day (TID) | ORAL | Status: DC | PRN

## 2015-03-28 MED ORDER — BD SWAB SINGLE USE REGULAR PADS: MEDICATED_PAD | Status: AC

## 2015-03-28 MED ORDER — PRAVASTATIN SODIUM 40 MG PO TABS: 40.0000 mg | ORAL_TABLET | Freq: Every day | ORAL | Status: DC

## 2015-03-28 MED ORDER — ACCU-CHEK SMARTVIEW CONTROL VI LIQD
Status: DC
Start: 2015-03-28 — End: 2015-06-20

## 2015-03-28 MED ORDER — GLUCOSE BLOOD VI STRP: ORAL_STRIP | Status: DC

## 2015-03-28 MED ORDER — ONETOUCH ULTRA SYSTEM W/DEVICE KIT: 1.0000 | PACK | Freq: Three times a day (TID) | Status: DC

## 2015-03-28 MED ORDER — LOTREL 5-10 MG PO CAPS: 1.0000 | ORAL_CAPSULE | Freq: Every day | ORAL | Status: DC

## 2015-03-28 MED ORDER — LANCETS MISC
Status: DC
Start: 2015-03-28 — End: 2015-10-10

## 2015-03-28 MED ORDER — BLOOD GLUCOSE MONITOR KIT: PACK | Status: AC

## 2015-03-28 MED ORDER — ESOMEPRAZOLE MAGNESIUM 40 MG PO CPDR: DELAYED_RELEASE_CAPSULE | ORAL | Status: DC

## 2015-03-28 MED ORDER — ALBUTEROL SULFATE HFA 108 (90 BASE) MCG/ACT IN AERS: INHALATION_SPRAY | RESPIRATORY_TRACT | Status: DC

## 2015-03-28 MED ORDER — FLUTICASONE PROPIONATE 50 MCG/ACT NA SUSP
NASAL | Status: DC
Start: 2015-03-28 — End: 2015-05-15

## 2015-03-28 MED ORDER — CLONAZEPAM 1 MG PO TABS: 1.0000 mg | ORAL_TABLET | Freq: Two times a day (BID) | ORAL | Status: DC | PRN

## 2015-03-28 NOTE — Progress Notes (Signed)
   Subjective:    Patient ID: Margretta Sidle, male    DOB: 12/04/1940, 75 y.o.   MRN: [id number] By signing my name below, I, Zola Button, attest that this documentation has been prepared under the direction and in the presence of [name], MD.  Electronically Signed: Zola Button, Medical Scribe. 03/28/2015. 11:08 AM.  HPI HPI Comments: JIDENNA FIGGS is a 75 y.o. male with a history of DM and chronic back pain for a follow-up and medication refill for hydrocodone.  Review of Systems  Musculoskeletal: Positive for back pain.   Recent hgb a1c was 7.0    Objective:   Physical Exam CONSTITUTIONAL: Well developed/well nourished HEAD: Normocephalic/atraumatic EYES: EOM/PERRL. Fundoscopic exam normal. ENMT: Mucous membranes moist NECK: supple no meningeal signs SPINE: entire spine nontender CV: S1/S2 noted, no murmurs/rubs/gallops noted LUNGS: Lungs are clear to auscultation bilaterally, no apparent distress ABDOMEN: soft, nontender, no rebound or guarding GU: no cva tenderness NEURO: Pt is awake/alert, moves all extremitiesx4 EXTREMITIES: pulses normal, full ROM SKIN: warm, color normal PSYCH: no abnormalities of mood noted Normal fundi     Assessment & Plan:   This chart was scribed in my presence and reviewed by me personally.    ICD-9-CM ICD-10-CM   1. Primary osteoarthritis of left knee 715.16 M17.12 HYDROcodone-acetaminophen (NORCO) 7.5-325 MG tablet  2. Low back pain with sciatica, sciatica laterality unspecified, unspecified back pain laterality 724.3 M54.40 HYDROcodone-acetaminophen (NORCO) 7.5-325 MG tablet  3. Anxiety state 300.00 F41.1 clonazePAM (KLONOPIN) 1 MG tablet  4. Type 2 diabetes mellitus without complication, without long-term current use of insulin (HCC) 250.00 E11.9 pravastatin (PRAVACHOL) 40 MG tablet     metFORMIN (GLUCOPHAGE) 1000 MG tablet     glucose blood (ONE TOUCH ULTRA TEST) test strip     LOTREL 5-10 MG capsule     Lancets MISC   Blood Glucose Calibration (ACCU-CHEK SMARTVIEW CONTROL) LIQD     blood glucose meter kit and supplies KIT     Blood Glucose Monitoring Suppl (ONE TOUCH ULTRA SYSTEM KIT) w/Device KIT     Alcohol Swabs (B-D SINGLE USE SWABS REGULAR) PADS     Signed, [name], MD

## 2015-03-29 ENCOUNTER — Telehealth: Payer: Self-pay

## 2015-03-29 DIAGNOSIS — E119 Type 2 diabetes mellitus without complications: Secondary | ICD-10-CM

## 2015-03-29 MED ORDER — GLUCOSE BLOOD VI STRP: ORAL_STRIP | Status: DC

## 2015-03-29 MED ORDER — ONETOUCH ULTRA SYSTEM W/DEVICE KIT: PACK | Status: DC

## 2015-03-29 NOTE — Telephone Encounter (Signed)
Pt called and stated that the wrong brand of glucometer and strips were sent to [facility] (Accu chek). Pt has used the One Touch Ultra for years and it works the best for him. He stated he has tried several others including the ins preferred alternative and they do not work as well for him. I will send in correct Rxs and see if they require a PA. Pt also reported that ins covers TID testing for him d/t fluctuating blood sugars.

## 2015-03-29 NOTE — Telephone Encounter (Signed)
Brand name Lotrel needs another PA to renew coverage. Pt has tried/failed both generics of amlodipine and benazepril together and they are not as effective for controlling pt's HTN as the name brand. PA completed on covermymeds and is pending.

## 2015-03-30 NOTE — Telephone Encounter (Signed)
PA approved through 02/24/16. Notified pt.

## 2015-04-11 DIAGNOSIS — J4520 Mild intermittent asthma, uncomplicated: Secondary | ICD-10-CM | POA: Diagnosis not present

## 2015-04-12 ENCOUNTER — Other Ambulatory Visit: Payer: Self-pay | Admitting: Family Medicine

## 2015-04-12 NOTE — Telephone Encounter (Signed)
Pt called to ask for RF of clonazepam be sent to [facility] on [address]. I called him to ask about the one written on 2/1 for [facility] and what status of that shipment is. He stated that he just got off the phone with them and they will ship it on out, so he will just wait for that one. I refused the [facility].

## 2015-04-18 DIAGNOSIS — E119 Type 2 diabetes mellitus without complications: Secondary | ICD-10-CM | POA: Diagnosis not present

## 2015-04-23 ENCOUNTER — Other Ambulatory Visit: Payer: Self-pay | Admitting: Family Medicine

## 2015-04-24 ENCOUNTER — Other Ambulatory Visit: Payer: Self-pay

## 2015-04-24 DIAGNOSIS — M5440 Lumbago with sciatica, unspecified side: Secondary | ICD-10-CM

## 2015-04-24 DIAGNOSIS — M1712 Unilateral primary osteoarthritis, left knee: Secondary | ICD-10-CM

## 2015-04-24 MED ORDER — HYDROCODONE-ACETAMINOPHEN 7.5-325 MG PO TABS: 1.0000 | ORAL_TABLET | Freq: Three times a day (TID) | ORAL | Status: DC | PRN

## 2015-04-24 NOTE — Telephone Encounter (Signed)
Refill provided in Dr. Loma Boston absence with instructions to not fill prior to 05/01/2015 as in the notes.

## 2015-04-24 NOTE — Telephone Encounter (Signed)
Pt reqs RF of hydrocodone so that it can be mailed to him and be received by March 7th when due.

## 2015-04-27 ENCOUNTER — Telehealth: Payer: Self-pay

## 2015-04-27 NOTE — Telephone Encounter (Signed)
Pt advised. Mailed Rx per pt's request.

## 2015-04-27 NOTE — Telephone Encounter (Signed)
Pt is checking on the request of his hydrocodone refill   Best number [phone]

## 2015-04-28 ENCOUNTER — Other Ambulatory Visit: Payer: Self-pay | Admitting: Family Medicine

## 2015-04-30 ENCOUNTER — Ambulatory Visit (HOSPITAL_COMMUNITY)
Admission: EM | Admit: 2015-04-30 | Discharge: 2015-05-01 | Disposition: A | Discharge status: Home / Self Care | Payer: Medicare Other | Attending: Internal Medicine | Admitting: Internal Medicine

## 2015-04-30 ENCOUNTER — Emergency Department (HOSPITAL_COMMUNITY): Payer: Medicare Other

## 2015-04-30 ENCOUNTER — Encounter (HOSPITAL_COMMUNITY): Payer: Self-pay | Admitting: *Deleted

## 2015-04-30 ENCOUNTER — Encounter (HOSPITAL_COMMUNITY)
Admission: EM | Disposition: A | Discharge status: Home / Self Care | Payer: Self-pay | Source: Home / Self Care | Attending: Emergency Medicine

## 2015-04-30 DIAGNOSIS — Z882 Allergy status to sulfonamides status: Secondary | ICD-10-CM | POA: Insufficient documentation

## 2015-04-30 DIAGNOSIS — Z23 Encounter for immunization: Secondary | ICD-10-CM | POA: Insufficient documentation

## 2015-04-30 DIAGNOSIS — K219 Gastro-esophageal reflux disease without esophagitis: Secondary | ICD-10-CM | POA: Diagnosis present

## 2015-04-30 DIAGNOSIS — E669 Obesity, unspecified: Secondary | ICD-10-CM | POA: Insufficient documentation

## 2015-04-30 DIAGNOSIS — Z7984 Long term (current) use of oral hypoglycemic drugs: Secondary | ICD-10-CM | POA: Insufficient documentation

## 2015-04-30 DIAGNOSIS — R404 Transient alteration of awareness: Secondary | ICD-10-CM | POA: Diagnosis not present

## 2015-04-30 DIAGNOSIS — E7800 Pure hypercholesterolemia, unspecified: Secondary | ICD-10-CM | POA: Diagnosis not present

## 2015-04-30 DIAGNOSIS — I480 Paroxysmal atrial fibrillation: Secondary | ICD-10-CM | POA: Diagnosis not present

## 2015-04-30 DIAGNOSIS — R531 Weakness: Secondary | ICD-10-CM | POA: Diagnosis not present

## 2015-04-30 DIAGNOSIS — Z6837 Body mass index (BMI) 37.0-37.9, adult: Secondary | ICD-10-CM | POA: Diagnosis not present

## 2015-04-30 DIAGNOSIS — I10 Essential (primary) hypertension: Secondary | ICD-10-CM | POA: Diagnosis not present

## 2015-04-30 DIAGNOSIS — E785 Hyperlipidemia, unspecified: Secondary | ICD-10-CM | POA: Diagnosis not present

## 2015-04-30 DIAGNOSIS — I442 Atrioventricular block, complete: Secondary | ICD-10-CM

## 2015-04-30 DIAGNOSIS — E119 Type 2 diabetes mellitus without complications: Secondary | ICD-10-CM | POA: Insufficient documentation

## 2015-04-30 DIAGNOSIS — Z950 Presence of cardiac pacemaker: Secondary | ICD-10-CM

## 2015-04-30 DIAGNOSIS — R001 Bradycardia, unspecified: Secondary | ICD-10-CM | POA: Diagnosis not present

## 2015-04-30 DIAGNOSIS — Z8249 Family history of ischemic heart disease and other diseases of the circulatory system: Secondary | ICD-10-CM | POA: Insufficient documentation

## 2015-04-30 DIAGNOSIS — F17220 Nicotine dependence, chewing tobacco, uncomplicated: Secondary | ICD-10-CM | POA: Diagnosis not present

## 2015-04-30 DIAGNOSIS — J45909 Unspecified asthma, uncomplicated: Secondary | ICD-10-CM | POA: Insufficient documentation

## 2015-04-30 DIAGNOSIS — I455 Other specified heart block: Secondary | ICD-10-CM | POA: Diagnosis not present

## 2015-04-30 DIAGNOSIS — R9431 Abnormal electrocardiogram [ECG] [EKG]: Secondary | ICD-10-CM | POA: Diagnosis not present

## 2015-04-30 HISTORY — DX: Pure hypercholesterolemia, unspecified: E78.00

## 2015-04-30 HISTORY — DX: Essential (primary) hypertension: I10

## 2015-04-30 HISTORY — DX: Paroxysmal atrial fibrillation: I48.0

## 2015-04-30 HISTORY — PX: EP IMPLANTABLE DEVICE: SHX172B

## 2015-04-30 LAB — CBC
HEMATOCRIT: 40.2 % (ref 39.0–52.0)
HEMOGLOBIN: 13.4 g/dL (ref 13.0–17.0)
MCH: 30 pg (ref 26.0–34.0)
MCHC: 33.3 g/dL (ref 30.0–36.0)
MCV: 90.1 fL (ref 78.0–100.0)
Platelets: 184 10*3/uL (ref 150–400)
RBC: 4.46 MIL/uL (ref 4.22–5.81)
RDW: 14.4 % (ref 11.5–15.5)
WBC: 8.2 10*3/uL (ref 4.0–10.5)

## 2015-04-30 LAB — TSH: TSH: 1.324 u[IU]/mL (ref 0.350–4.500)

## 2015-04-30 LAB — BASIC METABOLIC PANEL
ANION GAP: 11 (ref 5–15)
BUN: 7 mg/dL (ref 6–20)
CALCIUM: 8.6 mg/dL — AB (ref 8.9–10.3)
CO2: 22 mmol/L (ref 22–32)
Chloride: 102 mmol/L (ref 101–111)
Creatinine, Ser: 1.36 mg/dL — ABNORMAL HIGH (ref 0.61–1.24)
GFR calc Af Amer: 58 mL/min — ABNORMAL LOW (ref >60)
GFR calc non Af Amer: 50 mL/min — ABNORMAL LOW (ref >60)
GLUCOSE: 164 mg/dL — AB (ref 65–99)
POTASSIUM: 4.2 mmol/L (ref 3.5–5.1)
Sodium: 135 mmol/L (ref 135–145)

## 2015-04-30 LAB — GLUCOSE, CAPILLARY
GLUCOSE-CAPILLARY: 121 mg/dL — AB (ref 65–99)
Glucose-Capillary: 112 mg/dL — ABNORMAL HIGH (ref 65–99)

## 2015-04-30 LAB — SURGICAL PCR SCREEN
MRSA, PCR: NEGATIVE
STAPHYLOCOCCUS AUREUS: NEGATIVE

## 2015-04-30 LAB — I-STAT TROPONIN, ED
Troponin i, poc: 0.02 ng/mL (ref 0.00–0.08)
Troponin i, poc: 0 ng/mL (ref 0.00–0.08)

## 2015-04-30 LAB — MAGNESIUM: MAGNESIUM: 1.7 mg/dL (ref 1.7–2.4)

## 2015-04-30 LAB — BRAIN NATRIURETIC PEPTIDE: B Natriuretic Peptide: 198.9 pg/mL — ABNORMAL HIGH (ref 0.0–100.0)

## 2015-04-30 LAB — TROPONIN I
TROPONIN I: 0.04 ng/mL — AB (ref <0.031)
Troponin I: 0.07 ng/mL — ABNORMAL HIGH (ref <0.031)

## 2015-04-30 LAB — PROTIME-INR
INR: 1.09 (ref 0.00–1.49)
PROTHROMBIN TIME: 14.3 s (ref 11.6–15.2)

## 2015-04-30 LAB — APTT: APTT: 28 s (ref 24–37)

## 2015-04-30 SURGERY — PACEMAKER IMPLANT

## 2015-04-30 MED ORDER — ONDANSETRON HCL 4 MG/2ML IJ SOLN
4.0000 mg | Freq: Four times a day (QID) | INTRAMUSCULAR | Status: DC | PRN
Start: 2015-04-30 — End: 2015-05-01

## 2015-04-30 MED ORDER — SODIUM CHLORIDE 0.9 % IV SOLN: INTRAVENOUS | Status: DC

## 2015-04-30 MED ORDER — PRAVASTATIN SODIUM 40 MG PO TABS
40.0000 mg | ORAL_TABLET | Freq: Every day | ORAL | Status: DC
  Administered 2015-04-30 (×2): 40 mg via ORAL
  Filled 2015-04-30 (×2): qty 1

## 2015-04-30 MED ORDER — SODIUM CHLORIDE 0.9 % IV SOLN: 250.0000 mL | INTRAVENOUS | Status: DC | PRN

## 2015-04-30 MED ORDER — ACETAMINOPHEN 325 MG PO TABS: 650.0000 mg | ORAL_TABLET | ORAL | Status: DC | PRN

## 2015-04-30 MED ORDER — FENTANYL CITRATE (PF) 100 MCG/2ML IJ SOLN
INTRAMUSCULAR | Status: DC | PRN
  Administered 2015-04-30: 12.5 ug via INTRAVENOUS
  Administered 2015-04-30: 25 ug via INTRAVENOUS

## 2015-04-30 MED ORDER — LIDOCAINE HCL (PF) 1 % IJ SOLN
INTRAMUSCULAR | Status: AC
  Filled 2015-04-30: qty 30

## 2015-04-30 MED ORDER — SODIUM CHLORIDE 0.9 % IR SOLN
80.0000 mg | Status: AC
  Administered 2015-04-30: 80 mg

## 2015-04-30 MED ORDER — PANTOPRAZOLE SODIUM 40 MG PO TBEC
40.0000 mg | DELAYED_RELEASE_TABLET | Freq: Every day | ORAL | Status: DC
  Administered 2015-04-30 – 2015-05-01 (×2): 40 mg via ORAL
  Filled 2015-04-30 (×2): qty 1

## 2015-04-30 MED ORDER — SODIUM CHLORIDE 0.9% FLUSH
3.0000 mL | Freq: Two times a day (BID) | INTRAVENOUS | Status: DC
Start: 2015-04-30 — End: 2015-05-01
  Administered 2015-05-01: 09:00:00 3 mL via INTRAVENOUS

## 2015-04-30 MED ORDER — ONDANSETRON HCL 4 MG/2ML IJ SOLN: 4.0000 mg | Freq: Four times a day (QID) | INTRAMUSCULAR | Status: DC | PRN

## 2015-04-30 MED ORDER — SODIUM CHLORIDE 0.9% FLUSH: 3.0000 mL | Freq: Two times a day (BID) | INTRAVENOUS | Status: DC

## 2015-04-30 MED ORDER — HYDROCODONE-ACETAMINOPHEN 7.5-325 MG PO TABS
1.0000 | ORAL_TABLET | Freq: Three times a day (TID) | ORAL | Status: DC | PRN
  Filled 2015-04-30: qty 1

## 2015-04-30 MED ORDER — NITROGLYCERIN 0.4 MG SL SUBL: 0.4000 mg | SUBLINGUAL_TABLET | SUBLINGUAL | Status: DC | PRN

## 2015-04-30 MED ORDER — CEFAZOLIN SODIUM-DEXTROSE 2-3 GM-% IV SOLR
INTRAVENOUS | Status: AC
  Filled 2015-04-30: qty 50

## 2015-04-30 MED ORDER — CEFAZOLIN SODIUM 1-5 GM-% IV SOLN
1.0000 g | Freq: Four times a day (QID) | INTRAVENOUS | Status: AC
  Administered 2015-04-30 – 2015-05-01 (×3): 1 g via INTRAVENOUS
  Filled 2015-04-30 (×3): qty 50

## 2015-04-30 MED ORDER — HEPARIN (PORCINE) IN NACL 2-0.9 UNIT/ML-% IJ SOLN
INTRAMUSCULAR | Status: AC
  Filled 2015-04-30: qty 500

## 2015-04-30 MED ORDER — MIDAZOLAM HCL 5 MG/5ML IJ SOLN
INTRAMUSCULAR | Status: AC
  Filled 2015-04-30: qty 5

## 2015-04-30 MED ORDER — ENOXAPARIN SODIUM 40 MG/0.4ML ~~LOC~~ SOLN
40.0000 mg | SUBCUTANEOUS | Status: DC
Start: 2015-05-01 — End: 2015-05-01

## 2015-04-30 MED ORDER — ALBUTEROL SULFATE (2.5 MG/3ML) 0.083% IN NEBU: 2.5000 mg | INHALATION_SOLUTION | Freq: Four times a day (QID) | RESPIRATORY_TRACT | Status: DC | PRN

## 2015-04-30 MED ORDER — INSULIN ASPART 100 UNIT/ML ~~LOC~~ SOLN: 0.0000 [IU] | Freq: Every day | SUBCUTANEOUS | Status: DC

## 2015-04-30 MED ORDER — ACETAMINOPHEN 325 MG PO TABS
325.0000 mg | ORAL_TABLET | ORAL | Status: DC | PRN
  Administered 2015-04-30 – 2015-05-01 (×2): 650 mg via ORAL
  Filled 2015-04-30 (×2): qty 2

## 2015-04-30 MED ORDER — SODIUM CHLORIDE 0.9 % IV SOLN
INTRAVENOUS | Status: DC | PRN
  Administered 2015-04-30: 50 mL/h via INTRAVENOUS

## 2015-04-30 MED ORDER — ASPIRIN 81 MG PO CHEW
324.0000 mg | CHEWABLE_TABLET | Freq: Once | ORAL | Status: AC
  Administered 2015-04-30: 324 mg via ORAL
  Filled 2015-04-30: qty 4

## 2015-04-30 MED ORDER — MAGNESIUM HYDROXIDE 400 MG/5ML PO SUSP: 30.0000 mL | Freq: Every day | ORAL | Status: DC | PRN

## 2015-04-30 MED ORDER — ZOLPIDEM TARTRATE 5 MG PO TABS: 5.0000 mg | ORAL_TABLET | Freq: Every evening | ORAL | Status: DC | PRN

## 2015-04-30 MED ORDER — CEFAZOLIN SODIUM-DEXTROSE 2-3 GM-% IV SOLR
2.0000 g | INTRAVENOUS | Status: AC
  Administered 2015-04-30: 2 g via INTRAVENOUS

## 2015-04-30 MED ORDER — INFLUENZA VAC SPLIT QUAD 0.5 ML IM SUSY
0.5000 mL | PREFILLED_SYRINGE | INTRAMUSCULAR | Status: AC
  Administered 2015-05-01: 0.5 mL via INTRAMUSCULAR
  Filled 2015-04-30: qty 0.5

## 2015-04-30 MED ORDER — CLONAZEPAM 0.5 MG PO TABS: 1.0000 mg | ORAL_TABLET | Freq: Two times a day (BID) | ORAL | Status: DC | PRN

## 2015-04-30 MED ORDER — ALPRAZOLAM 0.25 MG PO TABS: 0.2500 mg | ORAL_TABLET | Freq: Two times a day (BID) | ORAL | Status: DC | PRN

## 2015-04-30 MED ORDER — ALBUTEROL SULFATE HFA 108 (90 BASE) MCG/ACT IN AERS: 2.0000 | INHALATION_SPRAY | Freq: Four times a day (QID) | RESPIRATORY_TRACT | Status: DC | PRN

## 2015-04-30 MED ORDER — SODIUM CHLORIDE 0.9 % IR SOLN
Status: AC
  Filled 2015-04-30: qty 2

## 2015-04-30 MED ORDER — NICOTINE 14 MG/24HR TD PT24
14.0000 mg | MEDICATED_PATCH | Freq: Every day | TRANSDERMAL | Status: DC
  Administered 2015-04-30: 21:00:00 14 mg via TRANSDERMAL
  Filled 2015-04-30: qty 1

## 2015-04-30 MED ORDER — SODIUM CHLORIDE 0.9 % IV SOLN: 250.0000 mL | INTRAVENOUS | Status: DC

## 2015-04-30 MED ORDER — MIDAZOLAM HCL 5 MG/5ML IJ SOLN
INTRAMUSCULAR | Status: DC | PRN
  Administered 2015-04-30 (×3): 1 mg via INTRAVENOUS

## 2015-04-30 MED ORDER — INSULIN ASPART 100 UNIT/ML ~~LOC~~ SOLN: 0.0000 [IU] | Freq: Three times a day (TID) | SUBCUTANEOUS | Status: DC

## 2015-04-30 MED ORDER — HEPARIN (PORCINE) IN NACL 2-0.9 UNIT/ML-% IJ SOLN
INTRAMUSCULAR | Status: DC | PRN
  Administered 2015-04-30: 16:00:00

## 2015-04-30 MED ORDER — SODIUM CHLORIDE 0.9% FLUSH: 3.0000 mL | INTRAVENOUS | Status: DC | PRN

## 2015-04-30 MED ORDER — FENTANYL CITRATE (PF) 100 MCG/2ML IJ SOLN
INTRAMUSCULAR | Status: AC
  Filled 2015-04-30: qty 2

## 2015-04-30 MED ORDER — FLUTICASONE PROPIONATE 50 MCG/ACT NA SUSP
1.0000 | Freq: Every day | NASAL | Status: DC
  Administered 2015-04-30 – 2015-05-01 (×2): 1 via NASAL
  Filled 2015-04-30: qty 16

## 2015-04-30 MED ORDER — LIDOCAINE HCL (PF) 1 % IJ SOLN
INTRAMUSCULAR | Status: DC | PRN
  Administered 2015-04-30: 53 mL via INTRADERMAL

## 2015-04-30 SURGICAL SUPPLY — 7 items
CABLE SURGICAL S-101-97-12 (CABLE) ×2 IMPLANT
LEAD TENDRIL SDX 2088TC-52CM (Lead) ×2 IMPLANT
LEAD TENDRIL SDX 2088TC-58CM (Lead) ×2 IMPLANT
PAD DEFIB LIFELINK (PAD) ×2 IMPLANT
PPM ASSURITY DR PM2240 (Pacemaker) ×2 IMPLANT
SHEATH CLASSIC 7F (SHEATH) ×4 IMPLANT
TRAY PACEMAKER INSERTION (PACKS) ×2 IMPLANT

## 2015-04-30 NOTE — ED Provider Notes (Signed)
CSN: [id number]     Arrival date & time 04/30/15  1046 History   First MD Initiated Contact with Patient 04/30/15 1112     Chief Complaint  Patient presents with  . Extremity Weakness  . Shortness of Breath     (Consider location/radiation/quality/duration/timing/severity/associated sxs/prior Treatment) HPI Comments: Started feeling fatigued yesterday Tried to get up this morning, but felt weak all over Felt like going to fall Feel like indigestion in chest, began yesterday afternoon/evening This morning Legs very weak bilaterally Swelling bilaterally for a long time    Patient is a 75 y.o. male presenting with extremity weakness and shortness of breath.  Extremity Weakness Associated symptoms include chest pain. Pertinent negatives include no abdominal pain, no headaches and no shortness of breath.  Shortness of Breath Associated symptoms: chest pain   Associated symptoms: no abdominal pain, no fever, no headaches, no rash and no sore throat     Past Medical History  Diagnosis Date  . Diabetes mellitus without complication ([place])   . Hypertension   . Hypercholesteremia   . Paroxysmal atrial fibrillation with rapid ventricular response ([place]) 2010    in the setting of abdominal abscess   Past Surgical History  Procedure Laterality Date  . Finger amputation    . Abdominal percutaneous abscess drain  2010    abscess from ruptured appendix   Family History  Problem Relation Age of Onset  . Heart disease Mother   . Heart disease Father    Social History  Substance Use Topics  . Smoking status: Former Smoker -- 2.00 packs/day for 20 years    Types: Cigarettes    Quit date: 05/02/1979  . Smokeless tobacco: Current User    Types: Chew  . Alcohol Use: No    Review of Systems  Constitutional: Positive for appetite change and fatigue. Negative for fever.  HENT: Negative for sore throat.   Eyes: Negative for visual disturbance.  Respiratory: Negative for shortness of  breath.   Cardiovascular: Positive for chest pain and leg swelling.  Gastrointestinal: Negative for nausea (nausea yesterday), abdominal pain, diarrhea and constipation.  Genitourinary: Negative for difficulty urinating.  Musculoskeletal: Positive for extremity weakness. Negative for back pain and neck stiffness.  Skin: Negative for rash.  Neurological: Negative for syncope and headaches.      Allergies  Sulfonamide derivatives and Codeine  Home Medications   Prior to Admission medications   Medication Sig Start Date End Date Taking? Authorizing Provider  albuterol (PROAIR HFA) 108 (90 Base) MCG/ACT inhaler INHALE 2 PUFFS INTO THE  LUNGS EVERY 6 HOURS AS  NEEDED. 03/28/15  Yes [name], MD  Alcohol Swabs (B-D SINGLE USE SWABS REGULAR) PADS Test blood sugar 3 times daily due to fluctuating blood sugar. Dx code: E11.9 03/28/15  Yes [name], MD  Blood Glucose Calibration (ACCU-CHEK SMARTVIEW CONTROL) LIQD Test blood sugar 3 times daily due to fluctuating blood sugar. Dx code: E11.9 03/28/15  Yes [name], MD  blood glucose meter kit and supplies KIT Test blood sugar 3 times daily due to fluctuating blood sugar. Dx code: E11.9 03/28/15  Yes [name], MD  Blood Glucose Monitoring Suppl (ONE TOUCH ULTRA SYSTEM KIT) w/Device KIT Test blood sugar 3 times daily due to fluctuating blood sugars. Dx: E11.9 03/29/15  Yes [name], MD  clonazePAM (KLONOPIN) 1 MG tablet Take 1 tablet (1 mg total) by mouth 2 (two) times daily as needed for anxiety. 03/28/15  Yes [name], MD  esomeprazole ([place]) 40 MG  capsule TAKE 1 CAPSULE BY MOUTH EVERY DAY BEFORE BREAKFAST 03/28/15  Yes [name], MD  fluticasone [facility]) 50 MCG/ACT nasal spray USE 1 SPRAY IN EACH NOSTRIL EVERY DAY 03/28/15  Yes [name], MD  glucose blood (ONE TOUCH ULTRA TEST) test strip Test blood sugar 3 times daily due to fluctuating blood sugars. Dx: E11.9 04/24/15  Yes [name], MD   HYDROcodone-acetaminophen (NORCO) 7.5-325 MG tablet Take 1 tablet by mouth 3 (three) times daily as needed for moderate pain. 04/24/15  Yes Jaynee Eagles, PA-C  ibuprofen (ADVIL,MOTRIN) 600 MG tablet Take 1 tablet (600 mg total) by mouth every 8 (eight) hours as needed. for pain. 02/14/15  Yes [name], MD  Lancets MISC Test blood sugar 3 times daily due to fluctuating blood sugar. Dx code: E11.9 03/28/15  Yes [name], MD  LOTREL 5-10 MG capsule Take 1 capsule by mouth  daily 01/24/15  Yes [name], MD  metFORMIN (GLUCOPHAGE) 1000 MG tablet Take 0.5 tablets (500 mg total) by mouth 2 (two) times daily with a meal. 03/28/15  Yes [name], MD  pravastatin (PRAVACHOL) 40 MG tablet Take 1 tablet (40 mg total) by mouth daily. 03/28/15  Yes [name], MD  terbinafine (LAMISIL) 250 MG tablet Take 1 tablet (250 mg total) by mouth daily. 05/11/14  Yes [name], MD  UNABLE TO FIND NEBULIZER MACHINE. DX CODE: 491.0 04/06/14  Yes [name], MD  UNABLE TO FIND OXYGEN CONCENTRATOR AND BACK UP O2 TANK. NOCTURNAL USE, 2L. DX CODE: 491.0 04/06/14  Yes [name], MD  UNABLE TO FIND Wrist blood pressure monitor. Dx code: I38. Use to check blood pressure as directed. 07/12/14  Yes [name], MD  UNABLE TO FIND Pulse oximeter (finger). Dx code: Q20.601. Use to monitor O2 sat as directed. 07/12/14  Yes [name], MD  LOTREL 5-10 MG capsule Take 1 capsule by mouth daily. 03/28/15   [name], MD   BP 130/60 mmHg  Pulse 138  Temp(Src) 98.1 F (36.7 C) (Oral)  Resp 22  Ht _0  (1.727 m)  Wt 249 lb (112.946 kg)  BMI 37.87 kg/m2  SpO2 97% Physical Exam  Constitutional: He is oriented to person, place, and time. He appears well-developed and well-nourished. No distress.  HENT:  Head: Normocephalic and atraumatic.  Eyes: Conjunctivae and EOM are normal.  Neck: Normal range of motion.  Cardiovascular: Regular rhythm, normal heart sounds and intact distal pulses.   Bradycardia present.  Exam reveals no gallop and no friction rub.   No murmur heard. Pulmonary/Chest: Effort normal and breath sounds normal. No respiratory distress. He has no wheezes. He has no rales.  Abdominal: Soft. He exhibits no distension. There is no tenderness. There is no guarding.  Musculoskeletal: He exhibits no edema.  Neurological: He is alert and oriented to person, place, and time. He has normal strength. No cranial nerve deficit or sensory deficit. Coordination normal. GCS eye subscore is 4. GCS verbal subscore is 5. GCS motor subscore is 6.  Skin: Skin is warm and dry. He is not diaphoretic.  Nursing note and vitals reviewed.   ED Course  Procedures (including critical care time) Labs Review Labs Reviewed  BASIC METABOLIC PANEL - Abnormal; Notable for the following:    Glucose, Bld 164 (*)    Creatinine, Ser 1.36 (*)    Calcium 8.6 (*)    GFR calc non Af Amer 50 (*)    GFR calc Af Amer 58 (*)    All  other components within normal limits  BRAIN NATRIURETIC PEPTIDE - Abnormal; Notable for the following:    B Natriuretic Peptide 198.9 (*)    All other components within normal limits  CBC  MAGNESIUM  APTT  PROTIME-INR  I-STAT TROPOININ, ED  I-STAT TROPOININ, ED  I-STAT TROPOININ, ED    Imaging Review Dg Chest Portable 1 View  04/30/2015  CLINICAL DATA:  Bradycardia and chest discomfort EXAM: PORTABLE CHEST 1 VIEW COMPARISON:  12/16/2008 FINDINGS: The heart size and mediastinal contours are within normal limits. Both lungs are clear. The visualized skeletal structures are unremarkable. IMPRESSION: No active disease. Electronically Signed   By: Inez Catalina M.D.   On: 04/30/2015 12:15   I have personally reviewed and evaluated these images and lab results as part of my medical decision-making.   EKG Interpretation   Date/Time:  Monday April 30 2015 10:53:09 EST Ventricular Rate:  49 PR Interval:    QRS Duration: 99 QT Interval:  440 QTC Calculation: 397 R  Axis:   88 Text Interpretation:  AV block, complete (third degree) Borderline right  axis deviation Low voltage, precordial leads Abnormal R-wave progression,  early transition Since prior ECG, complete heart block is new Confirmed by  [facility] MD, Darling ([id number]) on 04/30/2015 11:35:20 AM       CRITICAL CARE: Complete Heart Block Performed by: Alvino Chapel   Total critical care time: 30 minutes  Critical care time was exclusive of separately billable procedures and treating other patients.  Critical care was necessary to treat or prevent imminent or life-threatening deterioration.  Critical care was time spent personally by me on the following activities: development of treatment plan with patient and/or surrogate as well as nursing, discussions with consultants, evaluation of patient's response to treatment, examination of patient, obtaining history from patient or surrogate, ordering and performing treatments and interventions, ordering and review of laboratory studies, ordering and review of radiographic studies, pulse oximetry and re-evaluation of patient's condition.   MDM   Final diagnoses:  Complete heart block by electrocardiogram [facility])  Generalized weakness   75 year old male with history of diabetes, hypertension, asthma presents with concern for generalized weakness and fatigue. Patient was found to be in complete heart block on arrival to the emergency department. He reports a sensation of weakness in his lower extremities, however his neurologic exam is normal, he has no back pain, and have low suspicion for spinal pathology or intracranial pathology causing his symptoms. Doubt Guillan Barre given normal exam. Patient's sensation weakness is likely secondary to symptomatic complete heart block. His blood pressures are within normal limits. Pads were placed on patient and cardiology was consulted. He'll be admitted for further care.   Gareth Morgan, MD 04/30/15  [phone]

## 2015-04-30 NOTE — Discharge Summary (Signed)
ELECTROPHYSIOLOGY PROCEDURE DISCHARGE SUMMARY    Patient ID: Darin Gonzalez,  MRN: [id number], DOB/AGE: 75-Jun-1942 75 y.o.  Admit date: 04/30/2015 Discharge date: 05/01/15  Primary Care Physician: [name], MD Primary Cardiologist: None Electrophysiologist: Dr. Lovena Le  Primary Discharge Diagnosis:  Symptomatic bradycardia/CHB, status post pacemaker implantation this admission  Secondary Discharge Diagnosis:  1. DM 2. HTN 3. HLD 4. PAFib      episode 2010 in setting of appendicitis/abdominial abcess, not on anticoagulation    Allergies  Allergen Reactions  . Sulfonamide Derivatives Shortness Of Breath  . Codeine     "crazy"     Procedures This Admission:  1.  Implantation of a STJ dual chamber PPM on 04/30/15 by Dr Lovena Le.  The patient received a St. Jude (serial number [id number]) pacemaker with [employer] Jude (serial number [id number]) right atrial lead and a St. Jude (serial number [id number]) right ventricular lead There were no immediate post procedure complications. 2.  CXR on 05/01/15 demonstrated no pneumothorax status post device implantation.   Brief HPI: Darin Gonzalez is a 75 y.o. male was admitted to [facility] via ER  04/30/15 c/o the day prior felt nauseated, weak. No other symptoms. No vomiting, SOB, presyncope, palpitations or chest pain. The day of admission, felt a little worse so called 911. Pt's  ECG showed CHB.  Hospital Course:  The patient was admitted with finding of CHB with junctional escape rhythm with rates 40's, no reversible causes were identified.  He did not report any c/o CP, had Trop remained negative and underwent implantation of a PPM  with details as outlined above.  He was monitored on telemetry overnight which demonstrated SR with V pacing.  Left chest was without hematoma or ecchymosis.  The device was interrogated and found to be functioning normally.  CXR was obtained and demonstrated no pneumothorax status post device implantation.  Wound care,  arm mobility, and restrictions were reviewed with the patient.  The patient was examined by Dr. Lovena Le and considered stable for discharge to home.    Physical Exam: Filed Vitals:   04/30/15 2044 05/01/15 0304 05/01/15 0713 05/01/15 0716  BP: 129/58 117/39 116/29 138/39  Pulse: 73 89 74   Temp: 97.6 F (36.4 C) 97.7 F (36.5 C) 97.6 F (36.4 C)   TempSrc: Oral Oral Oral   Resp: _0 Height:      Weight:  250 lb 10.6 oz (113.7 kg)    SpO2: 98% 94% 95%     GEN- The patient is obese, well appearing, alert and oriented x 3 today.   HEENT: normocephalic, atraumatic; sclera clear, conjunctiva pink; hearing intact; oropharynx clear; neck supple, no JVP Lungs- Clear to ausculation bilaterally, normal work of breathing.  No wheezes, rales, rhonchi Heart- Regular rate and rhythm, no murmurs, rubs or gallops, PMI not laterally displaced GI- soft, non-tender, non-distended, bowel sounds present, no hepatosplenomegaly Extremities- no clubbing, cyanosis, or edema MS- no significant deformity or atrophy Skin- warm and dry, no rash or lesion, left chest without hematoma/ecchymosis Psych- euthymic mood, full affect Neuro- no gross deficits   Labs:   Lab Results  Component Value Date   WBC 8.2 04/30/2015   HGB 13.4 04/30/2015   HCT 40.2 04/30/2015   MCV 90.1 04/30/2015   PLT 184 04/30/2015     Recent Labs Lab 05/01/15 0440  NA 142  K 4.2  CL 108  CO2 24  BUN 6  CREATININE 1.25*  CALCIUM 8.5*  PROT 5.6*  BILITOT 0.9  ALKPHOS 44  ALT 8*  AST 16  GLUCOSE 120*    Discharge Medications:    Medication List    ASK your doctor about these medications        ACCU-CHEK SMARTVIEW CONTROL Liqd  Test blood sugar 3 times daily due to fluctuating blood sugar. Dx code: E11.9     albuterol 108 (90 Base) MCG/ACT inhaler  Commonly known as:  PROAIR HFA  INHALE 2 PUFFS INTO THE  LUNGS EVERY 6 HOURS AS  NEEDED.     B-D SINGLE USE SWABS REGULAR Pads  Test blood sugar 3 times  daily due to fluctuating blood sugar. Dx code: E11.9     blood glucose meter kit and supplies Kit  Test blood sugar 3 times daily due to fluctuating blood sugar. Dx code: E11.9     clonazePAM 1 MG tablet  Commonly known as:  KLONOPIN  Take 1 tablet (1 mg total) by mouth 2 (two) times daily as needed for anxiety.     esomeprazole 40 MG capsule  Commonly known as:  NEXIUM  TAKE 1 CAPSULE BY MOUTH EVERY DAY BEFORE BREAKFAST     fluticasone 50 MCG/ACT nasal spray  Commonly known as:  FLONASE  USE 1 SPRAY IN EACH NOSTRIL EVERY DAY     glucose blood test strip  Commonly known as:  ONE TOUCH ULTRA TEST  Test blood sugar 3 times daily due to fluctuating blood sugars. Dx: E11.9     HYDROcodone-acetaminophen 7.5-325 MG tablet  Commonly known as:  NORCO  Take 1 tablet by mouth 3 (three) times daily as needed for moderate pain.     ibuprofen 600 MG tablet  Commonly known as:  ADVIL,MOTRIN  Take 1 tablet (600 mg total) by mouth every 8 (eight) hours as needed. for pain.     Lancets Misc  Test blood sugar 3 times daily due to fluctuating blood sugar. Dx code: E11.9     LOTREL 5-10 MG capsule  Generic drug:  amLODipine-benazepril  Take 1 capsule by mouth  daily     LOTREL 5-10 MG capsule  Generic drug:  amLODipine-benazepril  Take 1 capsule by mouth daily.     metFORMIN 1000 MG tablet  Commonly known as:  GLUCOPHAGE  Take 0.5 tablets (500 mg total) by mouth 2 (two) times daily with a meal.     ONE TOUCH ULTRA SYSTEM KIT w/Device Kit  Test blood sugar 3 times daily due to fluctuating blood sugars. Dx: E11.9     pravastatin 40 MG tablet  Commonly known as:  PRAVACHOL  Take 1 tablet (40 mg total) by mouth daily.     terbinafine 250 MG tablet  Commonly known as:  LAMISIL  Take 1 tablet (250 mg total) by mouth daily.     UNABLE TO FIND  NEBULIZER MACHINE. DX CODE: 491.0     UNABLE TO FIND  OXYGEN CONCENTRATOR AND BACK UP O2 TANK. NOCTURNAL USE, 2L. DX CODE: 491.0     UNABLE  TO FIND  Wrist blood pressure monitor. Dx code: I21. Use to check blood pressure as directed.     UNABLE TO FIND  Pulse oximeter (finger). Dx code: U72.536. Use to monitor O2 sat as directed.        Disposition: Home  Follow-up Information    Follow up with [facility] On 05/14/2015.   Specialty:  Cardiology   Why:  4:00PM   Contact information:   [address]  [place] [phone]      Follow up with Cristopher Peru, MD On 08/01/2015.   Specialty:  Cardiology   Why:  1:45PM   Contact information:   [address] [address] [phone]       Duration of Discharge Encounter: Greater than 30 minutes including physician time.  Venetia Night, PA-C 05/01/2015 8:33 AM   EP Attending  Patient seen and examined. Agree with above. He is stable for discharge.  Mikle Bosworth.D.

## 2015-04-30 NOTE — H&P (Signed)
History and Physical   Patient ID: Darin Gonzalez MRN: [id number], DOB/AGE: 75-Jul-1942 75 y.o. Date of Encounter: 04/30/2015  Primary Physician: [name], MD Primary Cardiologist: New  Chief Complaint:  Heart block  HPI: Darin Gonzalez is a 75 y.o. male with a history of PAF (during admit for acute appendicitis), DM, HTN, obesity, HLD.  Leg pain and weakness limits activity, he does housework and laundry, but no longer runs errands, even with someone else driving. Does not leave the house that often. Chronic DOE, no change. Chronic LE edema, for months, worse during the day.   Never has palpitations, no history of presyncope, dizziness. Last time his heart rate was checked was in January, pt never told he had a low HR.   Yesterday, felt nauseated, weak. No other symptoms. No vomiting, SOB, presyncope, palpitations or chest pain. Today, felt a little worse so called 911. Pt ECG was CHB. He is resting comfortably.   No other acute issues. He admits to eating a lot of Cheeto's, he does not stick to a diabetic diet.    Past Medical History  Diagnosis Date  . Diabetes mellitus without complication ([facility])   . Hypertension   . Hypercholesteremia   . Paroxysmal atrial fibrillation with rapid ventricular response ([place]) 2010    in the setting of abdominal abscess    Surgical History:  Past Surgical History  Procedure Laterality Date  . Finger amputation    . Abdominal percutaneous abscess drain  2010    abscess from ruptured appendix     I have reviewed the patient's current medications. Medication Sig  albuterol (PROAIR HFA) 108 (90 Base) MCG/ACT inhaler INHALE 2 PUFFS INTO THE  LUNGS EVERY 6 HOURS AS  NEEDED.  Alcohol Swabs (B-D SINGLE USE SWABS REGULAR) PADS Test blood sugar 3 times daily due to fluctuating blood sugar. Dx code: E11.9  Blood Glucose Calibration (ACCU-CHEK SMARTVIEW CONTROL) LIQD Test blood sugar 3 times daily due to fluctuating blood sugar. Dx code:  E11.9  blood glucose meter kit and supplies KIT Test blood sugar 3 times daily due to fluctuating blood sugar. Dx code: E11.9  Blood Glucose Monitoring Suppl (ONE TOUCH ULTRA SYSTEM KIT) w/Device KIT Test blood sugar 3 times daily due to fluctuating blood sugars. Dx: E11.9  clonazePAM (KLONOPIN) 1 MG tablet Take 1 tablet (1 mg total) by mouth 2 (two) times daily as needed for anxiety.  esomeprazole (NEXIUM) 40 MG capsule TAKE 1 CAPSULE BY MOUTH EVERY DAY BEFORE BREAKFAST  fluticasone (FLONASE) 50 MCG/ACT nasal spray USE 1 SPRAY IN EACH NOSTRIL EVERY DAY  glucose blood (ONE TOUCH ULTRA TEST) test strip Test blood sugar 3 times daily due to fluctuating blood sugars. Dx: E11.9  HYDROcodone-acetaminophen (NORCO) 7.5-325 MG tablet Take 1 tablet by mouth 3 (three) times daily as needed for moderate pain.  ibuprofen (ADVIL,MOTRIN) 600 MG tablet Take 1 tablet (600 mg total) by mouth every 8 (eight) hours as needed. for pain.  Lancets MISC Test blood sugar 3 times daily due to fluctuating blood sugar. Dx code: E11.9  LOTREL 5-10 MG capsule Take 1 capsule by mouth  daily  LOTREL 5-10 MG capsule Take 1 capsule by mouth daily.  metFORMIN (GLUCOPHAGE) 1000 MG tablet Take 0.5 tablets (500 mg total) by mouth 2 (two) times daily with a meal.  pravastatin (PRAVACHOL) 40 MG tablet Take 1 tablet (40 mg total) by mouth daily.  terbinafine (LAMISIL) 250 MG tablet Take 1 tablet (250 mg total) by mouth  daily.  UNABLE TO FIND NEBULIZER MACHINE. DX CODE: 491.0  UNABLE TO FIND OXYGEN CONCENTRATOR AND BACK UP O2 TANK. NOCTURNAL USE, 2L. DX CODE: 491.0  UNABLE TO FIND Wrist blood pressure monitor. Dx code: I107. Use to check blood pressure as directed.  UNABLE TO FIND Pulse oximeter (finger). Dx code: V03.500. Use to monitor O2 sat as directed.   Allergies:  Allergies  Allergen Reactions  . Sulfonamide Derivatives Shortness Of Breath  . Codeine     "crazy"    Social History   Social History  . Marital Status:  Legally Separated    Spouse Name: N/A  . Number of Children: N/A  . Years of Education: N/A   Occupational History  . Retired    Social History Main Topics  . Smoking status: Former Smoker -- 2.00 packs/day for 20 years    Types: Cigarettes    Quit date: 05/02/1979  . Smokeless tobacco: Current User    Types: Chew  . Alcohol Use: No  . Drug Use: No  . Sexual Activity: No   Other Topics Concern  . Not on file   Social History Narrative   Lives alone, daughter lives next door. Family helps with errands, transportation.    Family History  Problem Relation Age of Onset  . Heart disease Mother   . Heart disease Father    Family Status  Relation Status Death Age  . Mother Deceased   . Father Deceased     Review of Systems:   Full 14-point review of systems otherwise negative except as noted above.  Physical Exam: Blood pressure 123/49, pulse 44, temperature 98.1 F (36.7 C), temperature source Oral, resp. rate 20, height '5\' 8"'  (1.727 m), weight 249 lb (112.946 kg), SpO2 100 %. General: Well developed, well nourished,male in no acute distress. Head: Normocephalic, atraumatic, sclera non-icteric, no xanthomas, nares are without discharge. Dentition: edentulous Neck: No carotid bruits. JVD not obviously elevated but difficult to assess 2nd body habitus. No thyromegally Lungs: Good expansion bilaterally. without wheezes or rhonchi.  Heart: Regular rate and rhythm with S1 S2.  No S3 or S4.  No murmur, no rubs, or gallops appreciated. Abdomen: Soft, non-tender, non-distended with normoactive bowel sounds. No hepatomegaly. No rebound/guarding. No obvious abdominal masses. Msk:  Strength and tone appear normal for age. No joint deformities or effusions, no spine or costo-vertebral angle tenderness. Extremities: No clubbing or cyanosis. R>L edema.  Distal pedal pulses are 2+ in 4 extrem Neuro: Alert and oriented X 3. Moves all extremities spontaneously. No focal deficits  noted. Psych:  Responds to questions appropriately with a normal affect. Skin: No rashes or lesions noted  Labs:   Lab Results  Component Value Date   WBC 8.2 04/30/2015   HGB 13.4 04/30/2015   HCT 40.2 04/30/2015   MCV 90.1 04/30/2015   PLT 184 04/30/2015    Recent Labs Lab 04/30/15 1110  NA 135  K 4.2  CL 102  CO2 22  BUN 7  CREATININE 1.36*  CALCIUM 8.6*  GLUCOSE 164*    Recent Labs  04/30/15 1125  TROPIPOC 0.02   Lab Results  Component Value Date   CHOL 126 05/11/2014   HDL 50 05/11/2014   LDLCALC 44 05/11/2014   TRIG 161* 05/11/2014   B NATRIURETIC PEPTIDE  Date/Time Value Ref Range Status  04/30/2015 11:10 AM 198.9* 0.0 - 100.0 pg/mL Final    Radiology/Studies: Dg Chest Portable 1 View 04/30/2015  CLINICAL DATA:  Bradycardia and chest discomfort  EXAM: PORTABLE CHEST 1 VIEW COMPARISON:  12/16/2008 FINDINGS: The heart size and mediastinal contours are within normal limits. Both lungs are clear. The visualized skeletal structures are unremarkable. IMPRESSION: No active disease. Electronically Signed   By: Inez Catalina M.D.   On: 04/30/2015 12:15   Echo: 12/18/2008 Study Conclusions Left ventricle: The cavity size was normal. Wall thickness was normal. Systolic function was normal. The estimated ejection fraction was in the range of 55% to 60%. Features are consistent with a pseudonormal left ventricular filling pattern, with concomitant abnormal relaxation and increased filling pressure (grade 2 diastolic dysfunction).   ECG: 04/30/2015 CHB, rate 44, junctional escape rhythm   ASSESSMENT AND PLAN:  Principal Problem:   Complete heart block by electrocardiogram (HCC) - not on rate-lowering rx - symptomatic but not unstable - admit stepdown, external pacing pads are in place but would not use them unless pt has hypotension or decreased LOC - ck TSH, hold Lotrel - MD advise on PPM   Otherwise, continue home rx, add SSI. Active Problems:    Diabetes mellitus type 2, noninsulin dependent [facility])   Essential hypertension   GERD   Signed, Lenoard Aden 04/30/2015 2:24 PM Beeper [phone]  EP attending  Patient seen and examined. I reviewed the findings as documented above. He presents with symptomatic complete heart block. He is on no AV nodal blocking drugs. Exam demonstrates a well appearing 75 year old man, obese, with a regular bradycardia and clear lungs and no edema on exam. Neurologically is nonfocal. ECG demonstrates sinus rhythm with complete heart block. The escape rhythm is junctional and narrow. Review his medications demonstrates no AV nodal blocking drugs. He is symptomatic with dizziness.  Assessment and plan 1. Complete heart block. The patient has symptomatic complete heart block on no AV nodal blocking drugs. He will undergo permanent pacemaker insertion. I discussed the risks, goals, benefits, and expectations of the procedure and he wishes to proceed. 2. Hypertension. Adjustments in his blood pressure medications were made once he is undergone pacemaker insertion. I expect the initiation of beta blocker therapy.  Cristopher Peru, M.D.

## 2015-04-30 NOTE — Progress Notes (Signed)
Life with a pacemaker video viewed by patient and family. Reviewed pacemaker information with patient and family, questions answered. Good understanding verbalized by patient and family. Understanding of left arm restrictions,  dressing while keeping left arm movements in proper position, driving and showering restrictions also reviewed. Patient lives alone but family lives next door and are available if needed. Patient is very independent but is aware of need to follow restrictions to avoid dislodging of leads and another surgery. Plan of care for tomorrow reviewed with verbalized understanding by patient and family.

## 2015-04-30 NOTE — Discharge Instructions (Signed)
° ° °  Supplemental Discharge Instructions for  °Pacemaker/Defibrillator Patients ° °Activity °No heavy lifting or vigorous activity with your left/right arm for 6 to 8 weeks.  Do not raise your left/right arm above your head for one week.  Gradually raise your affected arm as drawn below. ° °        °    05/05/15                    05/06/15                     05/07/15                  05/08/15 °__ ° °NO DRIVING for 1 week    ; you may begin driving on   05/08/15  . ° °WOUND CARE °- Keep the wound area clean and dry.  Do not get this area wet for one week. No showers for one week; you may shower on 05/08/15    . °- The tape/steri-strips on your wound will fall off; do not pull them off.  No bandage is needed on the site.  DO  NOT apply any creams, oils, or ointments to the wound area. °- If you notice any drainage or discharge from the wound, any swelling or bruising at the site, or you develop a fever > 101? F after you are discharged home, call the office at once. ° °Special Instructions °- You are still able to use cellular telephones; use the ear opposite the side where you have your pacemaker/defibrillator.  Avoid carrying your cellular phone near your device. °- When traveling through airports, show security personnel your identification card to avoid being screened in the metal detectors.  Ask the security personnel to use the hand wand. °- Avoid arc welding equipment, MRI testing (magnetic resonance imaging), TENS units (transcutaneous nerve stimulators).  Call the office for questions about other devices. °- Avoid electrical appliances that are in poor condition or are not properly grounded. °- Microwave ovens are safe to be near or to operate. ° °Additional information for defibrillator patients should your device go off: °- If your device goes off ONCE and you feel fine afterward, notify the device clinic nurses. °- If your device goes off ONCE and you do not feel well afterward, call 911. °- If your device  goes off TWICE, call 911. °- If your device goes off THREE times in one day, call 911. ° °DO NOT DRIVE YOURSELF OR A FAMILY MEMBER °WITH A DEFIBRILLATOR TO THE HOSPITAL--CALL 911. °

## 2015-04-30 NOTE — ED Notes (Signed)
Pt called [facility] b/c he felt he had the "blahs" a little longer than he should.  States bil LE edema for several months, but increasing weakness since yesterday (per EMS), pt could barely walk.  Pt also c/o dyspnea on exertion and at rest.  CBG 181, bp 120/50, nsr 60.

## 2015-04-30 NOTE — ED Notes (Signed)
Cardiology at bedside.

## 2015-04-30 NOTE — ED Notes (Signed)
Ok for pt to have ice chips per EDP. Pt given ice chips.

## 2015-05-01 ENCOUNTER — Telehealth: Payer: Self-pay

## 2015-05-01 ENCOUNTER — Ambulatory Visit (HOSPITAL_COMMUNITY): Payer: Medicare Other

## 2015-05-01 ENCOUNTER — Encounter (HOSPITAL_COMMUNITY): Payer: Self-pay | Admitting: Internal Medicine

## 2015-05-01 ENCOUNTER — Other Ambulatory Visit: Payer: Self-pay

## 2015-05-01 DIAGNOSIS — I480 Paroxysmal atrial fibrillation: Secondary | ICD-10-CM | POA: Diagnosis not present

## 2015-05-01 DIAGNOSIS — I442 Atrioventricular block, complete: Secondary | ICD-10-CM | POA: Diagnosis not present

## 2015-05-01 DIAGNOSIS — I10 Essential (primary) hypertension: Secondary | ICD-10-CM | POA: Diagnosis not present

## 2015-05-01 DIAGNOSIS — R918 Other nonspecific abnormal finding of lung field: Secondary | ICD-10-CM | POA: Diagnosis not present

## 2015-05-01 DIAGNOSIS — E119 Type 2 diabetes mellitus without complications: Secondary | ICD-10-CM | POA: Diagnosis not present

## 2015-05-01 LAB — HEMOGLOBIN A1C
HEMOGLOBIN A1C: 6.8 % — AB (ref 4.8–5.6)
MEAN PLASMA GLUCOSE: 148 mg/dL

## 2015-05-01 LAB — COMPREHENSIVE METABOLIC PANEL
ALK PHOS: 44 U/L (ref 38–126)
ALT: 8 U/L — ABNORMAL LOW (ref 17–63)
ANION GAP: 10 (ref 5–15)
AST: 16 U/L (ref 15–41)
Albumin: 3.1 g/dL — ABNORMAL LOW (ref 3.5–5.0)
BUN: 6 mg/dL (ref 6–20)
CALCIUM: 8.5 mg/dL — AB (ref 8.9–10.3)
CO2: 24 mmol/L (ref 22–32)
Chloride: 108 mmol/L (ref 101–111)
Creatinine, Ser: 1.25 mg/dL — ABNORMAL HIGH (ref 0.61–1.24)
GFR calc non Af Amer: 55 mL/min — ABNORMAL LOW (ref >60)
Glucose, Bld: 120 mg/dL — ABNORMAL HIGH (ref 65–99)
Potassium: 4.2 mmol/L (ref 3.5–5.1)
SODIUM: 142 mmol/L (ref 135–145)
TOTAL PROTEIN: 5.6 g/dL — AB (ref 6.5–8.1)
Total Bilirubin: 0.9 mg/dL (ref 0.3–1.2)

## 2015-05-01 LAB — TROPONIN I: TROPONIN I: 0.06 ng/mL — AB (ref <0.031)

## 2015-05-01 LAB — GLUCOSE, CAPILLARY: GLUCOSE-CAPILLARY: 120 mg/dL — AB (ref 65–99)

## 2015-05-01 MED ORDER — YOU HAVE A PACEMAKER BOOK
Freq: Once | Status: AC
  Administered 2015-05-01: 09:00:00
  Filled 2015-05-01: qty 1

## 2015-05-01 NOTE — Progress Notes (Signed)
Patient's daughter Albertina ParrJennifer Roberts, calls and states that she does not have patient's transmitter. Spoke to Walt DisneyBryan Small with [employer] and he states "I told the patient that transmitter will be shipped to his home via [employer]." Called patient's daughter Albertina ParrJennifer Roberts, and informed at this time. Victorino DikeJennifer voiced understanding.

## 2015-05-01 NOTE — Telephone Encounter (Signed)
Pt is needing to talk with [name] about his medication and that the lotrel needs to be changed back to 5-20 mg and he needs a refill because all he has is 5-10 mg and he wanted to let her know he just got back from the hospital  And has had a pacemaker put in   Best number [phone]

## 2015-05-01 NOTE — Progress Notes (Signed)
Patient's pacemaker has been interrogated at this time.

## 2015-05-01 NOTE — Telephone Encounter (Signed)
Spoke to pt who wanted to tell me about his pacemaker, and he had been thinking that maybe he should go back to the higher strength Lotrel. His cardiologist did not increase the dose, the pt was just wondering if it should be increased again since he had trouble w/irregular heartbeat while on lower dose. I checked his BP readings at [facility] while on the 5-10 dose which were all good, and read pt Dr Cain SaupeL's message from 07/10/14 refill message. Pt agreed that he should/would stay on the 5-10 as Rxd and talk to his cardiologist about whether any changes should be made when he goes for his f/up appt on 3/20.

## 2015-05-01 NOTE — Care Management Note (Signed)
Case Management Note  Patient Details  Name: Dianna LimboJimmy A Newburg MRN: [id number] Date of Birth: [birth year]  Subjective/Objective:   Patient for dc to home today, pta indep, no needs.                 Action/Plan:   Expected Discharge Date:                  Expected Discharge Plan:  Home/Self Care  In-House Referral:     Discharge planning Services  CM Consult  Post Acute Care Choice:    Choice offered to:     DME Arranged:    DME Agency:     HH Arranged:    HH Agency:     Status of Service:  Completed, signed off  Medicare Important Message Given:    Date Medicare IM Given:    Medicare IM give by:    Date Additional Medicare IM Given:    Additional Medicare Important Message give by:     If discussed at Long Length of Stay Meetings, dates discussed:    Additional Comments:  Leone Havenaylor, Orby Tangen Clinton, RN 05/01/2015, 9:15 AM

## 2015-05-01 NOTE — Progress Notes (Signed)
Patient given pacemaker book.

## 2015-05-02 ENCOUNTER — Telehealth: Payer: Self-pay | Admitting: *Deleted

## 2015-05-02 ENCOUNTER — Telehealth: Payer: Self-pay | Admitting: Family Medicine

## 2015-05-02 ENCOUNTER — Other Ambulatory Visit: Payer: Self-pay | Admitting: Family Medicine

## 2015-05-02 DIAGNOSIS — M1712 Unilateral primary osteoarthritis, left knee: Secondary | ICD-10-CM

## 2015-05-02 DIAGNOSIS — M5440 Lumbago with sciatica, unspecified side: Secondary | ICD-10-CM

## 2015-05-02 MED ORDER — HYDROCODONE-ACETAMINOPHEN 7.5-325 MG PO TABS: 1.0000 | ORAL_TABLET | Freq: Three times a day (TID) | ORAL | Status: DC | PRN

## 2015-05-02 NOTE — Telephone Encounter (Signed)
Pt states he hasn't received his Hydrocodone and thinks the mail man has put it in the wrong box. He would like for Dr. Elbert EwingsL to refill this medication and states that if he does receive it in the mail that he will throw it away. The new prescription that we write, he will have his son to pick it up.  Someone please advise   [phone]

## 2015-05-02 NOTE — Telephone Encounter (Signed)
Left message for pt to call.

## 2015-05-02 NOTE — Telephone Encounter (Signed)
Spoke with Darin Gonzalez, the pt called them this morning nerviouis about the pacemaker he recently had inserted. EMS reports paced rhythm at a rate of 80. The pt denies CP or SOB, he is just c/o weakness. He reports the same weakness as prior to the insertion of the pacer. The pt does not want to go back to the hospital, he wanted EMS to call us. Advised EMS to give the pt reassurance and we will call him back to check on him.

## 2015-05-02 NOTE — Telephone Encounter (Signed)
Dr L, do you want to replace the Rx? It was mailed on 04/27/15.

## 2015-05-02 NOTE — Telephone Encounter (Signed)
Spoke with pt, he had weakness this morning that was similar to what he had prior to having the pacer placed. He feels 100% better now. He has anxiety and he thinks it got the best of him. Reassurance given to the pt and he will call with any concerns prior to appt.

## 2015-05-04 ENCOUNTER — Telehealth: Payer: Self-pay | Admitting: Internal Medicine

## 2015-05-04 NOTE — Telephone Encounter (Signed)
Pt is very concerned and wearied about his condition this morning. He needs reassurance that everything is going all right please.

## 2015-05-04 NOTE — Telephone Encounter (Signed)
Returned call to patient.  He states he is worried about his condition.  He says he is " an ole worry wart" like his mom.  He feels fine just wanted reassurance.  He will keep his follow up as scheduled

## 2015-05-07 ENCOUNTER — Telehealth: Payer: Self-pay | Admitting: *Deleted

## 2015-05-07 NOTE — Telephone Encounter (Signed)
Received message from answering service about patient "having issues with his legs".  Returned patient's call and clarified that this has been ongoing for the past few months.  Patient states that his legs "feel like they're going to give out because they're so weak".  Patient denies dizziness, shortness of breath, chest pain, or other cardiac symptoms.  He states his blood pressure has been "fine" and that it was 174/68 this morning but that he does not trust his automatic wrist BP cuff.  He states that other than the weakness in his legs, he is feeling well.  He states that he wants to be more active now that he feels good, but he feels like his weakness is limiting him.  Advised patient that he should contact his PCP for recommendations regarding his leg weakness, but that if he has any questions or concerns regarding his pacemaker or cardiac symptoms, he should contact our office.  Reviewed arm and weight restrictions with patient and confirmed his wound check appointment on 05/14/15.  Patient is appreciative of call and denies questions or concerns at this time.

## 2015-05-09 DIAGNOSIS — J4520 Mild intermittent asthma, uncomplicated: Secondary | ICD-10-CM | POA: Diagnosis not present

## 2015-05-11 NOTE — Telephone Encounter (Signed)
Pt has had son pick up Rx. He called me back they next day to report that the orig one did finally come in the mail. Pt wants to know whether Dr L wants him to destroy the duplicate or save it for next month so he won't have to ask for another refill?

## 2015-05-11 NOTE — Telephone Encounter (Signed)
He should destroy the one I wrote the other day

## 2015-05-12 NOTE — Telephone Encounter (Signed)
Spoke with patient advised to destroy prescription .  Patient understood

## 2015-05-14 ENCOUNTER — Ambulatory Visit: Payer: Medicare Other

## 2015-05-15 ENCOUNTER — Telehealth: Payer: Self-pay

## 2015-05-15 DIAGNOSIS — E119 Type 2 diabetes mellitus without complications: Secondary | ICD-10-CM

## 2015-05-15 MED ORDER — ESOMEPRAZOLE MAGNESIUM 40 MG PO CPDR: DELAYED_RELEASE_CAPSULE | ORAL | Status: DC

## 2015-05-15 MED ORDER — FLUTICASONE PROPIONATE 50 MCG/ACT NA SUSP: NASAL | Status: DC

## 2015-05-15 MED ORDER — LOTREL 5-10 MG PO CAPS: ORAL_CAPSULE | ORAL | Status: DC

## 2015-05-15 MED ORDER — METFORMIN HCL 1000 MG PO TABS: 500.0000 mg | ORAL_TABLET | Freq: Two times a day (BID) | ORAL | Status: DC

## 2015-05-15 MED ORDER — PRAVASTATIN SODIUM 40 MG PO TABS: 40.0000 mg | ORAL_TABLET | Freq: Every day | ORAL | Status: DC

## 2015-05-15 MED ORDER — IBUPROFEN 600 MG PO TABS: 600.0000 mg | ORAL_TABLET | Freq: Three times a day (TID) | ORAL | Status: DC | PRN

## 2015-05-15 NOTE — Telephone Encounter (Signed)
Pt stated that [facility] reports they don't have RFs on many of his meds. I advised we sent 10 in on 2/1, but I will re-send the ones that pt stated he needs to be re-sent.

## 2015-05-22 ENCOUNTER — Other Ambulatory Visit: Payer: Self-pay | Admitting: Family Medicine

## 2015-05-22 DIAGNOSIS — M1712 Unilateral primary osteoarthritis, left knee: Secondary | ICD-10-CM

## 2015-05-22 DIAGNOSIS — F411 Generalized anxiety disorder: Secondary | ICD-10-CM

## 2015-05-22 DIAGNOSIS — M5440 Lumbago with sciatica, unspecified side: Secondary | ICD-10-CM

## 2015-05-23 ENCOUNTER — Other Ambulatory Visit: Payer: Self-pay | Admitting: Family Medicine

## 2015-05-23 ENCOUNTER — Encounter: Payer: Self-pay | Admitting: Internal Medicine

## 2015-05-23 DIAGNOSIS — F411 Generalized anxiety disorder: Secondary | ICD-10-CM

## 2015-05-23 DIAGNOSIS — M5440 Lumbago with sciatica, unspecified side: Secondary | ICD-10-CM

## 2015-05-23 DIAGNOSIS — M1712 Unilateral primary osteoarthritis, left knee: Secondary | ICD-10-CM

## 2015-05-23 MED ORDER — HYDROCODONE-ACETAMINOPHEN 7.5-325 MG PO TABS: 1.0000 | ORAL_TABLET | Freq: Three times a day (TID) | ORAL | Status: DC | PRN

## 2015-05-23 MED ORDER — CLONAZEPAM 1 MG PO TABS: 1.0000 mg | ORAL_TABLET | Freq: Two times a day (BID) | ORAL | Status: DC | PRN

## 2015-05-23 NOTE — Telephone Encounter (Signed)
Pt called to req that his hydrocodone be refilled and mailed so that he will get it by the 8th when due. He reported that he did "rip up" the extra Rx that he finally received last month after we had replaced it. He also would like his clonazepam RF sent in to [facility] so they will have it when it is due for them to send out to him.

## 2015-05-24 ENCOUNTER — Other Ambulatory Visit: Payer: Self-pay | Admitting: Family Medicine

## 2015-05-24 DIAGNOSIS — F411 Generalized anxiety disorder: Secondary | ICD-10-CM

## 2015-05-24 MED ORDER — CLONAZEPAM 1 MG PO TABS
1.0000 mg | ORAL_TABLET | Freq: Two times a day (BID) | ORAL | Status: DC | PRN
Start: 2015-05-24 — End: 2015-07-19

## 2015-05-24 NOTE — Telephone Encounter (Signed)
I advised pt faxing clonazepam to [facility] and mailing hydrocodone Rx to him today. Also discussed need for new PCP (and this being last Rxs Dr L can write for him). Suggested [facility], or [facility] or [facility].  Dr L, pt would like some names of providers you would especially recommend for him. He would like your recommendation for someone that may possibly be as good as you.

## 2015-05-30 ENCOUNTER — Telehealth: Payer: Self-pay

## 2015-05-30 NOTE — Telephone Encounter (Signed)
Pt called concerning having to find a new provider since Dr L is retiring. He asked about RFs and I advised that [facility] can send us requests again if needed and we should be able to get him another RF until he gets appt w/new Dr. Also will mail him a ROI form to fill out and mail back so that [facility] can send his chart to new provider.

## 2015-05-31 ENCOUNTER — Telehealth: Payer: Self-pay

## 2015-05-31 DIAGNOSIS — M1712 Unilateral primary osteoarthritis, left knee: Secondary | ICD-10-CM

## 2015-05-31 DIAGNOSIS — M5440 Lumbago with sciatica, unspecified side: Secondary | ICD-10-CM

## 2015-05-31 MED ORDER — HYDROCODONE-ACETAMINOPHEN 7.5-325 MG PO TABS: 1.0000 | ORAL_TABLET | Freq: Three times a day (TID) | ORAL | Status: DC | PRN

## 2015-05-31 NOTE — Telephone Encounter (Signed)
Pt called because he got his hydrocodone Rx in the mail and it was written for only #45 instead of the #90 he normally gets for the month. Dr L, I told pt about needing to find a new provider within the month for next script, but did you mean to only give him 2 wks worth? Please advise. I advised pt to go ahead and get the #45 filled since it would take a while for a new Rx to get to him through mail.

## 2015-05-31 NOTE — Telephone Encounter (Signed)
This is a duplicate, I already sent a message to Dr L.

## 2015-05-31 NOTE — Telephone Encounter (Signed)
Patient wants to know why he is receiving half the amount of hydrocodone that he usually receives.  Best number [phone]

## 2015-05-31 NOTE — Telephone Encounter (Signed)
Pharmacy just called and wanted to know if dr Milus Glazierlauenstein will up the amount of patient medication hydrocodone from 15 to 30 day supply   Best number [phone]

## 2015-06-01 NOTE — Telephone Encounter (Signed)
Dr. Elbert EwingsL please review

## 2015-06-02 NOTE — Telephone Encounter (Signed)
I refilled the medicine and left the prescription at the front desk.  Future pain medicine needs to be issued by his chronic pain clinic providers.

## 2015-06-05 NOTE — Telephone Encounter (Signed)
Called pt, LM to pick up Rx.

## 2015-06-06 ENCOUNTER — Ambulatory Visit: Payer: Medicare Other

## 2015-06-07 ENCOUNTER — Ambulatory Visit (INDEPENDENT_AMBULATORY_CARE_PROVIDER_SITE_OTHER): Payer: Medicare Other | Admitting: *Deleted

## 2015-06-07 ENCOUNTER — Encounter: Payer: Self-pay | Admitting: Internal Medicine

## 2015-06-07 DIAGNOSIS — Z950 Presence of cardiac pacemaker: Secondary | ICD-10-CM | POA: Diagnosis not present

## 2015-06-07 LAB — CUP PACEART INCLINIC DEVICE CHECK
Brady Statistic RA Percent Paced: 1.5 %
Brady Statistic RV Percent Paced: 99.37 %
Implantable Lead Implant Date: 20170306
Implantable Lead Location: 753859
Lead Channel Impedance Value: 575 Ohm
Lead Channel Pacing Threshold Amplitude: 0.75 V
Lead Channel Pacing Threshold Pulse Width: 0.4 ms
Lead Channel Sensing Intrinsic Amplitude: 12 mV
Lead Channel Setting Pacing Amplitude: 3.5 V
MDC IDC LEAD IMPLANT DT: 20170306
MDC IDC LEAD LOCATION: 753860
MDC IDC MSMT BATTERY REMAINING LONGEVITY: 78
MDC IDC MSMT BATTERY VOLTAGE: 3.02 V
MDC IDC MSMT LEADCHNL RA IMPEDANCE VALUE: 425 Ohm
MDC IDC MSMT LEADCHNL RA PACING THRESHOLD AMPLITUDE: 0.75 V
MDC IDC MSMT LEADCHNL RA PACING THRESHOLD AMPLITUDE: 0.75 V
MDC IDC MSMT LEADCHNL RA PACING THRESHOLD PULSEWIDTH: 0.4 ms
MDC IDC MSMT LEADCHNL RA SENSING INTR AMPL: 5 mV
MDC IDC MSMT LEADCHNL RV PACING THRESHOLD AMPLITUDE: 0.75 V
MDC IDC MSMT LEADCHNL RV PACING THRESHOLD PULSEWIDTH: 0.4 ms
MDC IDC MSMT LEADCHNL RV PACING THRESHOLD PULSEWIDTH: 0.4 ms
MDC IDC SESS DTM: 20170413151422
MDC IDC SET LEADCHNL RV PACING AMPLITUDE: 3.5 V
MDC IDC SET LEADCHNL RV PACING PULSEWIDTH: 0.4 ms
MDC IDC SET LEADCHNL RV SENSING SENSITIVITY: 4 mV
Pulse Gen Model: 2240
Pulse Gen Serial Number: 7885711

## 2015-06-07 NOTE — Progress Notes (Signed)
Wound check appointment s/p PPM implant 04/30/15. Steri-strips removed. Wound without redness or edema. Incision edges approximated, wound well healed. Normal device function. Thresholds, sensing, and impedances consistent with implant measurements. Device programmed at 3.5V for extra safety margin until 3 month visit. Histogram distribution appropriate for patient and level of activity. 11 mode switches- longest 7min 46 sec with an atrial rate of 165bpm. No high ventricular rates noted. 1 "PMT" episode- STach. Implanted for CHB- intact conduction today with a PR interval of 280ms, VIP turned on at nominal settings. Patient educated about wound care, arm mobility, lifting restrictions. ROV with GT 08/01/15.

## 2015-06-08 NOTE — Telephone Encounter (Signed)
Pt called and stated that he will continue to come to us at walk in for PCP. I reiterated to him again that he needs to get his hydrocodone from a pain clinic.   Pt advised that he will have his nephew Darin Gonzalez pick up his hydrocodone Rx that is in drawer.

## 2015-06-09 DIAGNOSIS — J4520 Mild intermittent asthma, uncomplicated: Secondary | ICD-10-CM | POA: Diagnosis not present

## 2015-06-11 ENCOUNTER — Other Ambulatory Visit: Payer: Self-pay | Admitting: Family Medicine

## 2015-06-13 ENCOUNTER — Other Ambulatory Visit: Payer: Self-pay

## 2015-06-13 MED ORDER — ZOSTER VACCINE LIVE 19400 UNT/0.65ML ~~LOC~~ SOLR: 0.6500 mL | Freq: Once | SUBCUTANEOUS | Status: AC

## 2015-06-13 NOTE — Telephone Encounter (Signed)
Called and notified pt that vaccine Rx sent.

## 2015-06-13 NOTE — Telephone Encounter (Signed)
Dr L, pt called to give me a report from the ins RN that visits him. A1C was 6.4 and BP 120/64. Pt was very pleased with this. Also, RN suggested pt get the shingles vaccine if he hasn't. I don't see any record that we ordered it for him before (in EPIC) and not on immun tab. Do you want to send in Rx? Pended.

## 2015-06-20 ENCOUNTER — Other Ambulatory Visit: Payer: Self-pay | Admitting: Family Medicine

## 2015-06-22 ENCOUNTER — Other Ambulatory Visit: Payer: Self-pay

## 2015-06-22 DIAGNOSIS — M1712 Unilateral primary osteoarthritis, left knee: Secondary | ICD-10-CM

## 2015-06-22 DIAGNOSIS — M5440 Lumbago with sciatica, unspecified side: Secondary | ICD-10-CM

## 2015-06-22 DIAGNOSIS — G8929 Other chronic pain: Secondary | ICD-10-CM

## 2015-06-22 DIAGNOSIS — M549 Dorsalgia, unspecified: Principal | ICD-10-CM

## 2015-06-22 NOTE — Telephone Encounter (Signed)
Pt called and asked if Dr Milus GlazierLauenstein could write one more month of hydrocodone for him until he can get in to see another provider next month. I discussed w/pt that Dr L advised that pt needs to get this from a pain clinic going forward, but pt has never been to a pain clinic and there is no current referral in for pt. I checked w/Referral dept to see hx of referrals, and it looks like in the past pt could not get into one because he had Medicaid. Last Aug a referral was done and the clinic had left message for pt to CB to schedule, but nothing else was mentioned. That referral has expired so I will put in a new one, but it does take a while to get the pt in for appt, esp with Medicare plan. Dr L, please advise on RF until Pain clinic appt.

## 2015-06-25 MED ORDER — HYDROCODONE-ACETAMINOPHEN 7.5-325 MG PO TABS: 1.0000 | ORAL_TABLET | Freq: Three times a day (TID) | ORAL | Status: AC | PRN

## 2015-06-25 NOTE — Telephone Encounter (Signed)
Notified pt that new referral has been started to pain clinic and that Dr L did write a Rx. I will mail the Rx to pt per his request.

## 2015-06-25 NOTE — Telephone Encounter (Signed)
Pt called back and advised that his nephew Darin Gonzalez will pick up Rx instead. Rx is still in drawer, not mailed yet.

## 2015-07-09 DIAGNOSIS — J4520 Mild intermittent asthma, uncomplicated: Secondary | ICD-10-CM | POA: Diagnosis not present

## 2015-07-13 ENCOUNTER — Other Ambulatory Visit: Payer: Self-pay | Admitting: Family Medicine

## 2015-07-17 ENCOUNTER — Other Ambulatory Visit: Payer: Self-pay | Admitting: Family Medicine

## 2015-07-19 ENCOUNTER — Other Ambulatory Visit: Payer: Self-pay

## 2015-07-19 ENCOUNTER — Telehealth: Payer: Self-pay

## 2015-07-19 DIAGNOSIS — F411 Generalized anxiety disorder: Secondary | ICD-10-CM

## 2015-07-19 MED ORDER — CLONAZEPAM 1 MG PO TABS
1.0000 mg | ORAL_TABLET | Freq: Two times a day (BID) | ORAL | Status: DC | PRN
Start: 2015-07-19 — End: 2015-10-08

## 2015-07-19 NOTE — Telephone Encounter (Signed)
Pt called about est with new provider. I advised pt to call when he knows that he can arrange transportation and we can let him know what providers are on the schedule for that day. He was given some cards by Dr L for recommendations.   I also checked on pt's referral to pain clinic and we sent the referral on 5/1. I do not see any further notes on it, and pt has not received any call. Referrals, I know it takes a long time to get a pt in to pain clinics, but could you please check on this? Thanks!

## 2015-07-19 NOTE — Telephone Encounter (Signed)
Pharm reqs RF of clonazepam. Mail order normally reqs it about a month before it is due.

## 2015-07-20 DIAGNOSIS — E119 Type 2 diabetes mellitus without complications: Secondary | ICD-10-CM | POA: Diagnosis not present

## 2015-07-24 NOTE — Telephone Encounter (Signed)
I have repeatedly referred patient to pain clinic.  At some point, this needs to be done.  Also, patient needs to find a new PCP.  I have told him of my retirement plans for the past year. There are [facility] who are taking new patients, e.g. Dr. Willow OraJose Paz.   Please direct patient to a new PCP. KL

## 2015-07-24 NOTE — Telephone Encounter (Signed)
Pt called back and stated that he has not been able to arrange transportation yet to come in to see new provider. He will plan to get here w/in the month to est care. Can someone get him a refill of his hydrocodone for the month in the meantime? See notes below, we are trying to get pt appt w/pain clinic, but have not gotten an appt yet.

## 2015-07-24 NOTE — Telephone Encounter (Signed)
Called in. Pharm stated that an order was sent out to him on 5/1 so it is not due yet.

## 2015-07-25 NOTE — Telephone Encounter (Signed)
Referrals, have you had a chance to check on this referral to pain clinic?

## 2015-07-25 NOTE — Telephone Encounter (Signed)
I spoke to Darin Gonzalez and gave him Dr Cain SaupeL's message. He wants to continue coming here to walk in for check ups due to transportation issues, it is easier to not have to make an appt. He will plan to come in to see Dr Clelia CroftShaw as soon as he can get a ride, hopefully next week. He is still waiting on call from pain clinic.

## 2015-07-28 ENCOUNTER — Telehealth: Payer: Self-pay | Admitting: Radiology

## 2015-07-28 NOTE — Telephone Encounter (Signed)
Patient called for Hydrocodone. I have advised him per previous messages he is to get the Hydrocodone from pain clinic. He has voiced understanding

## 2015-07-30 NOTE — Telephone Encounter (Signed)
I checked on his referral to [facility].  He was accepted as a new patient on 06/27/15 and was given a call to schedule.  He said he was going out of town and needed to call back to schedule an appointment.  They have not heard from him since then.  The phone number for him to contact them is [phone].

## 2015-07-30 NOTE — Telephone Encounter (Signed)
Called pt and gave him the info from Referrals and # to [facility] to call to schedule appt. Emphasized to pt the importance of getting this appt scheduled. Pt agreed.

## 2015-08-01 ENCOUNTER — Encounter: Payer: Medicare Other | Admitting: Internal Medicine

## 2015-08-01 ENCOUNTER — Telehealth: Payer: Self-pay | Admitting: *Deleted

## 2015-08-01 MED ORDER — RIVAROXABAN 20 MG PO TABS: 20.0000 mg | ORAL_TABLET | Freq: Every day | ORAL | Status: DC

## 2015-08-01 NOTE — Telephone Encounter (Signed)
Informed patient that AF was discovered on his ppm on 07/28/15. I explained to him what AF is and how he would need to take an anticoagulant (Xarelto 20 per Dr.Taylor) to reduce his risks of CVA.  I checked to make sure that patient was not taking an ASA, which he denies. I informed patient that he should minimize usage of Ibuprofen due to anticoagulant.  Informed patient to take medication at dinnertime.  Patient voiced understanding of all information given and states that he does not need to f/u with Dr.Taylor at the moment to discuss things further.  90day supply of Xarelto 20 sent in to pharmacy.

## 2015-08-02 ENCOUNTER — Telehealth: Payer: Self-pay | Admitting: Internal Medicine

## 2015-08-02 NOTE — Telephone Encounter (Signed)
needs a PA [phone]

## 2015-08-02 NOTE — Telephone Encounter (Signed)
Follow-up    Pt c/o medication issue:  1. Name of Medication: Xarelto  2. How are you currently taking this medication (dosage and times per day)? 20 mg po pt uncertain how to take it   3. Are you having a reaction (difficulty breathing--STAT)? no  4. What is your medication issue? The pt is stating the MD needs to call [facility] to give the authorization located On [address] phone number [phone]

## 2015-08-03 ENCOUNTER — Encounter: Payer: Self-pay | Admitting: Internal Medicine

## 2015-08-04 ENCOUNTER — Telehealth: Payer: Self-pay | Admitting: Physician Assistant

## 2015-08-04 DIAGNOSIS — I480 Paroxysmal atrial fibrillation: Secondary | ICD-10-CM

## 2015-08-04 MED ORDER — RIVAROXABAN 20 MG PO TABS: 20.0000 mg | ORAL_TABLET | Freq: Every day | ORAL | Status: DC

## 2015-08-04 NOTE — Telephone Encounter (Signed)
Darin Gonzalez is a 75 y.o. male s/p recent PPM. Review of the chart indicates that the patient was recently placed on Xarelto due to atrial fibrillation on his pacemaker interrogation. Patient has not been in a good Xarelto yet because he needs prior authorization. He did tell me that he can get Xarelto through [facility]. I have sent a prescription to [facility] I will check with his pharmacy to see if he can get 1 week's worth to last him until his Rx arrives in the mail. Tereso NewcomerScott Ludwig Tugwell, PA-C   08/04/2015 1:24 PM

## 2015-08-06 ENCOUNTER — Telehealth: Payer: Self-pay

## 2015-08-06 NOTE — Telephone Encounter (Signed)
PA Obtained

## 2015-08-06 NOTE — Telephone Encounter (Signed)
Done today.

## 2015-08-06 NOTE — Telephone Encounter (Signed)
Xarelto 20 mg approved through [facility]. Good through 02/24/2016. PA-  [id number].

## 2015-08-09 DIAGNOSIS — J4520 Mild intermittent asthma, uncomplicated: Secondary | ICD-10-CM | POA: Diagnosis not present

## 2015-08-13 DIAGNOSIS — M5137 Other intervertebral disc degeneration, lumbosacral region: Secondary | ICD-10-CM | POA: Diagnosis not present

## 2015-08-16 ENCOUNTER — Telehealth: Payer: Self-pay | Admitting: Internal Medicine

## 2015-08-16 NOTE — Telephone Encounter (Signed)
°  Follow Up    Pt is calling regarding his Xarelto medication. Has some questions regarding safety of medications. Please call.

## 2015-08-16 NOTE — Telephone Encounter (Signed)
New Message  Pt requested to speak with RN about his prescription Xarelto. Please call back to discuss

## 2015-08-16 NOTE — Telephone Encounter (Signed)
Returned call to patient.  He is concerned due to TV commercials in regards to Xarelto and bleeding.  I have reassured him and asked him to call if he sees any blood in his urine or has black stools.

## 2015-08-23 ENCOUNTER — Telehealth: Payer: Self-pay

## 2015-08-23 NOTE — Telephone Encounter (Signed)
Pt has been referred to [facility] and I advised him on last ph conversation I had with him about this that he needs to call them back to set up his appt, and pt had agreed. I gave him the contact info. Has he contacted them? We have told pt on many occasions that further pain meds need to come from the pain clinic.

## 2015-08-23 NOTE — Telephone Encounter (Signed)
Patient is calling to requset a refill for hydrocodone.

## 2015-08-24 NOTE — Telephone Encounter (Signed)
Patient will call pain clinic

## 2015-08-25 ENCOUNTER — Encounter: Payer: Self-pay | Admitting: Internal Medicine

## 2015-08-27 ENCOUNTER — Telehealth: Payer: Self-pay | Admitting: *Deleted

## 2015-08-27 NOTE — Telephone Encounter (Signed)
Spoke to patient about persistent AF since 6/14. Patient denies any sx's of ShOB or fatigue.   Will inform Dr.Taylor and call patient if anything further is recommended.

## 2015-08-29 ENCOUNTER — Other Ambulatory Visit: Payer: Self-pay | Admitting: *Deleted

## 2015-08-29 DIAGNOSIS — I480 Paroxysmal atrial fibrillation: Secondary | ICD-10-CM

## 2015-08-29 MED ORDER — RIVAROXABAN 20 MG PO TABS: 20.0000 mg | ORAL_TABLET | Freq: Every day | ORAL | Status: AC

## 2015-08-30 ENCOUNTER — Other Ambulatory Visit: Payer: Self-pay | Admitting: Family Medicine

## 2015-08-30 ENCOUNTER — Telehealth: Payer: Self-pay | Admitting: Internal Medicine

## 2015-08-30 NOTE — Telephone Encounter (Signed)
New message    Pt c/o medication issue:  1. Name of Medication: Xarelto and motrin   2. How are you currently taking this medication (dosage and times per day)? 20 mg po and 600 mg po  3. Are you having a reaction (difficulty breathing--STAT)? no  4. What is your medication issue? The pharmacy wants the physician to know that if both medication are taken together may increase the risk of bleeding

## 2015-08-30 NOTE — Telephone Encounter (Signed)
Will route this information to Dr Ladona Ridgelaylor and covering nurse as an Lorain ChildesFYI.  Dr Ladona Ridgelaylor did not prescribe the pt Ibuprofen, Dr Nilda SimmerKristi Smith did.

## 2015-08-31 DIAGNOSIS — T148 Other injury of unspecified body region: Secondary | ICD-10-CM | POA: Diagnosis not present

## 2015-08-31 DIAGNOSIS — T07 Unspecified multiple injuries: Secondary | ICD-10-CM | POA: Diagnosis not present

## 2015-09-01 ENCOUNTER — Emergency Department (HOSPITAL_COMMUNITY)
Admission: EM | Admit: 2015-09-01 | Discharge: 2015-09-01 | Disposition: A | Discharge status: Home / Self Care | Payer: Medicare Other | Attending: Emergency Medicine | Admitting: Emergency Medicine

## 2015-09-01 ENCOUNTER — Encounter (HOSPITAL_COMMUNITY): Payer: Self-pay | Admitting: Emergency Medicine

## 2015-09-01 DIAGNOSIS — E119 Type 2 diabetes mellitus without complications: Secondary | ICD-10-CM | POA: Insufficient documentation

## 2015-09-01 DIAGNOSIS — I480 Paroxysmal atrial fibrillation: Secondary | ICD-10-CM | POA: Diagnosis not present

## 2015-09-01 DIAGNOSIS — Y9389 Activity, other specified: Secondary | ICD-10-CM | POA: Diagnosis not present

## 2015-09-01 DIAGNOSIS — I10 Essential (primary) hypertension: Secondary | ICD-10-CM | POA: Insufficient documentation

## 2015-09-01 DIAGNOSIS — Y999 Unspecified external cause status: Secondary | ICD-10-CM | POA: Diagnosis not present

## 2015-09-01 DIAGNOSIS — Z7984 Long term (current) use of oral hypoglycemic drugs: Secondary | ICD-10-CM | POA: Diagnosis not present

## 2015-09-01 DIAGNOSIS — S59912A Unspecified injury of left forearm, initial encounter: Secondary | ICD-10-CM | POA: Diagnosis present

## 2015-09-01 DIAGNOSIS — S51012A Laceration without foreign body of left elbow, initial encounter: Secondary | ICD-10-CM | POA: Diagnosis not present

## 2015-09-01 DIAGNOSIS — F17290 Nicotine dependence, other tobacco product, uncomplicated: Secondary | ICD-10-CM | POA: Insufficient documentation

## 2015-09-01 DIAGNOSIS — W19XXXA Unspecified fall, initial encounter: Secondary | ICD-10-CM

## 2015-09-01 DIAGNOSIS — Z79899 Other long term (current) drug therapy: Secondary | ICD-10-CM | POA: Diagnosis not present

## 2015-09-01 DIAGNOSIS — S40812A Abrasion of left upper arm, initial encounter: Secondary | ICD-10-CM | POA: Insufficient documentation

## 2015-09-01 DIAGNOSIS — W06XXXA Fall from bed, initial encounter: Secondary | ICD-10-CM | POA: Diagnosis not present

## 2015-09-01 DIAGNOSIS — S50812A Abrasion of left forearm, initial encounter: Secondary | ICD-10-CM | POA: Diagnosis not present

## 2015-09-01 DIAGNOSIS — Y929 Unspecified place or not applicable: Secondary | ICD-10-CM | POA: Diagnosis not present

## 2015-09-01 NOTE — ED Provider Notes (Signed)
CSN: [id number]     Arrival date & time 09/01/15  0043 History   By signing my name below, I, Maud Deed. Royston Sinner, attest that this documentation has been prepared under the direction and in the presence of Charlesetta Shanks, MD.  Electronically Signed: Maud Deed. Royston Sinner, ED Scribe. 09/01/2015. 3:49 AM.   Chief Complaint  Patient presents with  . Fall  . Arm Injury   The history is provided by the patient. No language interpreter was used.    HPI Comments: DEANTE BLOUGH brought in by EMS from home is a 75 y.o. male with a PMHx of DM and HTN who presents to the Emergency Department here after a fall sustained just prior to arrival. Pt states he fell out of bed and landed on his L arm. He denies any other associated injuries or symptoms. Pt states he was able to ambulate and stand after fall. No head injury. No headache or loss of consciousness. No chest pain or difficult breathing. Denies any fever, chills, hip pain, or back pain.  PCP: [name], MD    Past Medical History  Diagnosis Date  . Diabetes mellitus without complication ([place])   . Hypertension   . Hypercholesteremia   . Paroxysmal atrial fibrillation with rapid ventricular response ([place]) 2010    in the setting of abdominal abscess   Past Surgical History  Procedure Laterality Date  . Finger amputation    . Abdominal percutaneous abscess drain  2010    abscess from ruptured appendix  . Ep implantable device N/A 04/30/2015    Procedure: Pacemaker Implant;  Surgeon: Evans Lance, MD;  Location: [facility];  Service: Cardiovascular;  Laterality: N/A;   Family History  Problem Relation Age of Onset  . Heart disease Mother   . Heart disease Father    Social History  Substance Use Topics  . Smoking status: Former Smoker -- 2.00 packs/day for 20 years    Types: Cigarettes    Quit date: 05/02/1979  . Smokeless tobacco: Current User    Types: Chew  . Alcohol Use: No    Review of Systems  A complete 10 system  review of systems was obtained and all systems are negative except as noted in the HPI and PMH.    Allergies  Sulfonamide derivatives and Codeine  Home Medications   Prior to Admission medications   Medication Sig Start Date End Date Taking? Authorizing Provider  Alcohol Swabs (B-D SINGLE USE SWABS REGULAR) PADS Test blood sugar 3 times daily due to fluctuating blood sugar. Dx code: E11.9 03/28/15   [name], MD  Blood Glucose Calibration (ACCU-CHEK SMARTVIEW CONTROL) LIQD Test blood sugar 3 times  daily due to fluctuating  blood sugar 06/22/15   [name], MD  blood glucose meter kit and supplies KIT Test blood sugar 3 times daily due to fluctuating blood sugar. Dx code: E11.9 03/28/15   [name], MD  Blood Glucose Monitoring Suppl (ONE TOUCH ULTRA 2) w/Device KIT Test blood sugar 3 times  daily due to fluctuating  blood sugars. 08/31/15   [name], MD  clonazePAM (KLONOPIN) 1 MG tablet Take 1 tablet (1 mg total) by mouth 2 (two) times daily as needed for anxiety. 07/19/15   [name], MD  esomeprazole (NEXIUM) 40 MG capsule Take 1 capsule by mouth  every day before breakfast 08/31/15   [name], MD  fluticasone [facility]) 50 MCG/ACT nasal spray USE 1 SPRAY IN EACH NOSTRIL EVERY DAY 05/15/15  [name], MD  glucose blood (ONE TOUCH ULTRA TEST) test strip Test blood sugar 3 times daily due to fluctuating blood sugars. Dx: E11.9 04/24/15   [name], MD  HYDROcodone-acetaminophen (NORCO) 7.5-325 MG tablet Take 1 tablet by mouth 3 (three) times daily as needed for moderate pain. 06/25/15   [name], MD  ibuprofen (ADVIL,MOTRIN) 600 MG tablet TAKE 1 TABLET BY MOUTH  EVERY 8 HOURS AS NEEDED FOR PAIN. 07/18/15   Wardell Honour, MD  Lancets MISC Test blood sugar 3 times daily due to fluctuating blood sugar. Dx code: E11.9 03/28/15   [name], MD  LOTREL 5-10 MG capsule Take 1 capsule by mouth  daily 08/31/15   [name], MD  metFORMIN (GLUCOPHAGE)  1000 MG tablet Take one-half tablet by  mouth two times daily with  meals 08/31/15   [name], MD  pravastatin (PRAVACHOL) 40 MG tablet Take 1 tablet by mouth  daily 08/31/15   [name], MD  [facility] HFA 108 [phone] Base) MCG/ACT inhaler Inhale 2 puffs every 6  hours as needed 08/31/15   [name], MD  rivaroxaban (XARELTO) 20 MG TABS tablet Take 1 tablet (20 mg total) by mouth daily with supper. 08/29/15   Evans Lance, MD  terbinafine (LAMISIL) 250 MG tablet Take 1 tablet (250 mg total) by mouth daily. 05/11/14   [name], MD  UNABLE TO FIND NEBULIZER MACHINE. DX CODE: 491.0 04/06/14   [name], MD  UNABLE TO FIND OXYGEN CONCENTRATOR AND BACK UP O2 TANK. NOCTURNAL USE, 2L. DX CODE: 491.0 04/06/14   [name], MD  UNABLE TO FIND Wrist blood pressure monitor. Dx code: I53. Use to check blood pressure as directed. 07/12/14   [name], MD  UNABLE TO FIND Pulse oximeter (finger). Dx code: D92.426. Use to monitor O2 sat as directed. 07/12/14   [name], MD  zoster vaccine live, PF, (ZOSTAVAX) 83419 UNT/0.65ML injection Inject 19,400 Units into the skin once. 06/13/15   [name], MD   Triage Vitals: BP 116/74 mmHg  Pulse 89  Temp(Src) 98.6 F (37 C) (Oral)  Resp 18  SpO2 94%   Physical Exam  Constitutional: He is oriented to person, place, and time. He appears well-developed and well-nourished. No distress.  Obese   HENT:  Head: Normocephalic and atraumatic.  Mouth/Throat: Oropharynx is clear and moist.  Eyes: EOM are normal.  Neck: Normal range of motion. Neck supple.  No C-spine tenderness  Cardiovascular: Normal rate, regular rhythm, normal heart sounds and intact distal pulses.   Pulmonary/Chest: Effort normal and breath sounds normal. No stridor. No respiratory distress.  Abdominal: Soft. He exhibits no distension. There is no tenderness.  Obese   Musculoskeletal: Normal range of motion. He exhibits no edema.  Able to move arm without  difficulty. Area of abrasion and bleeding on the L arm . Normal ROM of hips and knees. No joint effusion. 3 separate skin tears to L elbow.  Neurological: He is alert and oriented to person, place, and time.  No abnormal coordination. All movements are purposefully.  Skin: Skin is warm and dry. He is not diaphoretic.  Psychiatric: He has a normal mood and affect. Judgment normal.  Nursing note and vitals reviewed.   ED Course  Procedures (including critical care time)  DIAGNOSTIC STUDIES: Oxygen Saturation is 96% on RA, adequate by my interpretation.    COORDINATION OF CARE: 3:19 AM-Discussed treatment plan with pt at bedside and pt agreed to plan.  Labs Review Labs Reviewed - No data to display  Imaging Review No results found. I have personally reviewed and evaluated these images and lab results as part of my medical decision-making.   EKG Interpretation None      MDM   Final diagnoses:  Fall, initial encounter  Arm abrasion, left, initial encounter   Patient fell out of bed. By Hx and PE at this time only injury appears to be minor skin tears. Clinically no evidence of bone fracture or joint effusions. Wounds cleaned and dressed by nursing staff. Precautionary return instructions reviewed.   Charlesetta Shanks, MD 09/04/15 1252

## 2015-09-01 NOTE — ED Notes (Signed)
Brought in by EMS from home with c/o arm injury after a fall.  Per EMS, pt's daughter called EMS and reported that pt rolled off his bed and fell and injured his left forearm---- pt sustained skin tear to left posterior forearm.. Pressure dressing to the affected area applied by EMS---- bleeding controlled on arrival to ED.

## 2015-09-01 NOTE — ED Notes (Signed)
Bed: WA09 Expected date:  Expected time:  Means of arrival:  Comments: EMS fall / abrasions

## 2015-09-01 NOTE — Discharge Instructions (Signed)
Skin Tear Care  A skin tear is a wound in which the top layer of skin has peeled off. This is a common problem with aging because the skin becomes thinner and more fragile as a person gets older. In addition, some medicines, such as oral corticosteroids, can lead to skin thinning if taken for long periods of time.   A skin tear is often repaired with tape or skin adhesive strips. This keeps the skin that has been peeled off in contact with the healthier skin beneath. Depending on the location of the wound, a bandage (dressing) may be applied over the tape or skin adhesive strips. Sometimes, during the healing process, the skin turns black and dies. Even when this happens, the torn skin acts as a good dressing until the skin underneath gets healthier and repairs itself.  HOME CARE INSTRUCTIONS    Change dressings once per day or as directed by your caregiver.   Gently clean the skin tear and the area around the tear using saline solution or mild soap and water.   Do not rub the injured skin dry. Let the area air dry.   Apply petroleum jelly or an antibiotic cream or ointment to keep the tear moist. This will help the wound heal. Do not allow a scab to form.   If the dressing sticks before the next dressing change, moisten it with warm soapy water and gently remove it.   Protect the injured skin until it has healed.   Only take over-the-counter or prescription medicines as directed by your caregiver.   Take showers or baths using warm soapy water. Apply a new dressing after the shower or bath.   Keep all follow-up appointments as directed by your caregiver.   SEEK IMMEDIATE MEDICAL CARE IF:    You have redness, swelling, or increasing pain in the skin tear.   You havepus coming from the skin tear.   You have chills.   You have a red streak that goes away from the skin tear.   You have a bad smell coming from the tear or dressing.   You have a fever or persistent symptoms for more than 2-3 days.   You  have a fever and your symptoms suddenly get worse.  MAKE SURE YOU:   Understand these instructions.   Will watch this condition.   Will get help right away if your child is not doing well or gets worse.     This information is not intended to replace advice given to you by your health care provider. Make sure you discuss any questions you have with your health care provider.     Document Released: 11/05/2000 Document Revised: 11/05/2011 Document Reviewed: 08/25/2011  Elsevier Interactive Patient Education 2016 Elsevier Inc.

## 2015-09-08 DIAGNOSIS — J4520 Mild intermittent asthma, uncomplicated: Secondary | ICD-10-CM | POA: Diagnosis not present

## 2015-09-13 ENCOUNTER — Telehealth: Payer: Self-pay

## 2015-09-13 NOTE — Telephone Encounter (Signed)
Pt is wanting to know if he still can get his meds thru us  Best number [phone]

## 2015-09-17 NOTE — Telephone Encounter (Signed)
Pt would like a refill on his Hydrocodone. Can we refill? He has an appt with Dr. Clelia Croft in September.

## 2015-09-18 ENCOUNTER — Ambulatory Visit: Payer: Self-pay | Admitting: Family Medicine

## 2015-09-19 NOTE — Telephone Encounter (Signed)
Pt is checking on status of his med refill

## 2015-09-20 NOTE — Telephone Encounter (Signed)
Dr L has declined RFing this narcotic pain med any further. Pt has been referred to pain management and in last ph conversation with pt concerning this I gave pt this info and the number to [facility]. According to Referral notes, they are waiting for pt to CB to sch an appt. I called pt and gave him this info again. Pt agreed to call them to set up appt.

## 2015-09-22 ENCOUNTER — Telehealth: Payer: Self-pay

## 2015-09-22 NOTE — Progress Notes (Signed)
Encounter error

## 2015-09-22 NOTE — Telephone Encounter (Signed)
The patient called to follow up on medication refill request for Hydrocodone.  He said he has been out for several weeks.  He said he spoke to Castella about his situation with his pain medication, [facility], and his transportation issues (see previous phone message).    He said he told her that he wanted to establish with Dr Clelia Croft and get his pain medication (hydrocodone) from her instead of going to pain management.  He has scheduled an appt with her in Sept to establish care.  He was under the impression that this would be ok, and he cancelled his appointment with [facility], and he has been awaiting his refill.   Please advise, thank you.  CB#: [phone]

## 2015-09-24 NOTE — Telephone Encounter (Signed)
No absolutely not. I'm not sure how pt got this impression and I'm sorry he had a misunderstanding.  I have never seen pt before, his last visit was with Dr. Milus Glazier in March.  Some phone calls document that Dr. Milus Glazier stopped rxing to pt and had declined to refill his hydrocodone, instructing pt to follow-up with pain management so that is where he needs to go.   Also, in no case would I be willing to write or refill controlled meds without seeing the pt in an office visit - even a patient who is established and under a drug contract with me.  But do to recent update in CDC guidelines regarding the prescribing of controlled medications, most patients are being referred to pain management for ongoing treatment.

## 2015-09-24 NOTE — Telephone Encounter (Signed)
Pt called again about this refill on hydrocodone

## 2015-09-25 ENCOUNTER — Telehealth: Payer: Self-pay

## 2015-09-25 NOTE — Telephone Encounter (Signed)
Pt advised.

## 2015-09-25 NOTE — Telephone Encounter (Signed)
See previous message

## 2015-09-25 NOTE — Telephone Encounter (Signed)
Pt is checking on the status of refill. Pt wanted [place] aware he has been out for a while now and wants to know the hold up.  Please advise  [phone]

## 2015-10-03 ENCOUNTER — Telehealth: Payer: Self-pay

## 2015-10-03 NOTE — Telephone Encounter (Signed)
PA completed for name brand Lotrel on covermymeds. Pt has tried generic enalapril, and taking generic amlodipine with benazapril and it is not as effective as the name brand Lotrel. Pending.

## 2015-10-04 NOTE — Telephone Encounter (Signed)
PA approved through 02/24/2016. Notified [employer] mail order pharm.

## 2015-10-08 ENCOUNTER — Other Ambulatory Visit: Payer: Self-pay

## 2015-10-08 DIAGNOSIS — F411 Generalized anxiety disorder: Secondary | ICD-10-CM

## 2015-10-08 NOTE — Telephone Encounter (Signed)
Dr L pt, has appt with Dr Clelia CroftShaw 11/01/15 to est care. Pt has just switched to [facility] and [employer] is calling to see if pt can get a RF of clonazepam to carry him until his appt.? I will pend a 30 day supply. Also [employer] will be faxing a req for 90 day RFs of some of his chronic non-controlled  Meds. I can only OK a 30 day supply since pt was last seen for med refills in Feb. Can I have authorization to give the 90 day RFs of the non-controlled since it is mail order?

## 2015-10-09 DIAGNOSIS — J4520 Mild intermittent asthma, uncomplicated: Secondary | ICD-10-CM | POA: Diagnosis not present

## 2015-10-09 MED ORDER — CLONAZEPAM 1 MG PO TABS: 1.0000 mg | ORAL_TABLET | Freq: Two times a day (BID) | ORAL | 0 refills | Status: DC | PRN

## 2015-10-09 NOTE — Telephone Encounter (Signed)
Faxed. Will watch for faxes for other RFs.

## 2015-10-10 ENCOUNTER — Other Ambulatory Visit: Payer: Self-pay

## 2015-10-10 MED ORDER — ACCU-CHEK SOFTCLIX LANCETS MISC: 0 refills | Status: DC

## 2015-10-10 MED ORDER — ESOMEPRAZOLE MAGNESIUM 40 MG PO CPDR: DELAYED_RELEASE_CAPSULE | ORAL | 0 refills | Status: DC

## 2015-10-10 MED ORDER — PRAVASTATIN SODIUM 40 MG PO TABS: 40.0000 mg | ORAL_TABLET | Freq: Every day | ORAL | 0 refills | Status: DC

## 2015-10-10 MED ORDER — ACCU-CHEK AVIVA PLUS W/DEVICE KIT: PACK | 0 refills | Status: DC

## 2015-10-10 MED ORDER — GLUCOSE BLOOD VI STRP: ORAL_STRIP | 0 refills | Status: DC

## 2015-10-10 MED ORDER — ALBUTEROL SULFATE HFA 108 (90 BASE) MCG/ACT IN AERS: 2.0000 | INHALATION_SPRAY | Freq: Four times a day (QID) | RESPIRATORY_TRACT | 0 refills | Status: DC | PRN

## 2015-10-10 MED ORDER — FLUTICASONE PROPIONATE 50 MCG/ACT NA SUSP: NASAL | 0 refills | Status: DC

## 2015-10-10 MED ORDER — ACCU-CHEK AVIVA VI SOLN: 0 refills | Status: DC

## 2015-10-10 MED ORDER — LOTREL 5-10 MG PO CAPS: 1.0000 | ORAL_CAPSULE | Freq: Every day | ORAL | 0 refills | Status: DC

## 2015-10-10 MED ORDER — METFORMIN HCL 1000 MG PO TABS: ORAL_TABLET | ORAL | 0 refills | Status: DC

## 2015-10-10 NOTE — Telephone Encounter (Signed)
Meds ordered this encounter  Medications  . LOTREL 5-10 MG capsule    Sig: Take 1 capsule by mouth daily.    Dispense:  90 capsule    Refill:  0  . metFORMIN (GLUCOPHAGE) 1000 MG tablet    Sig: Take one-half tablet by  mouth two times daily with  meals    Dispense:  90 tablet    Refill:  0  . pravastatin (PRAVACHOL) 40 MG tablet    Sig: Take 1 tablet (40 mg total) by mouth daily.    Dispense:  90 tablet    Refill:  0  . esomeprazole (NEXIUM) 40 MG capsule    Sig: Take 1 capsule by mouth  every day before breakfast    Dispense:  90 capsule    Refill:  0  . fluticasone (FLONASE) 50 MCG/ACT nasal spray    Sig: USE 1 SPRAY IN EACH NOSTRIL EVERY DAY    Dispense:  48 g    Refill:  0  . albuterol (PROVENTIL HFA;VENTOLIN HFA) 108 (90 Base) MCG/ACT inhaler    Sig: Inhale 2 puffs into the lungs every 6 (six) hours as needed (cough, shortness of breath or wheezing.).    Dispense:  3 Inhaler    Refill:  0    Ventolin or other albuterol inhaler covered by insurance  . Blood Glucose Monitoring Suppl (ACCU-CHEK AVIVA PLUS) w/Device KIT    Sig: Test blood sugar 3 times daily due to fluctuating blood sugars. Dx: E11.9    Dispense:  1 kit    Refill:  0  . glucose blood (ACCU-CHEK AVIVA PLUS) test strip    Sig: Test blood sugar 3 times daily due to fluctuating blood sugars. Dx: E11.9    Dispense:  300 each    Refill:  0  . ACCU-CHEK SOFTCLIX LANCETS lancets    Sig: Test blood sugar 3 times daily due to fluctuating blood sugars. Dx: E11.9    Dispense:  300 each    Refill:  0  . Blood Glucose Calibration (ACCU-CHEK AVIVA) SOLN    Sig: Test blood sugar 3 times daily due to fluctuating blood sugars. Dx: E11.9    Dispense:  1 each    Refill:  0    I refilled everything except the ibuprofen. As he is taking Xarelto, he should not take NSAIDS. He can use acetaminophen as needed for pain. If that is not adequate, he can discuss alternatives with Dr. Brigitte Pulse when he sees her next month.

## 2015-10-10 NOTE — Telephone Encounter (Signed)
Pt has changed ins and mail order pharm and needs 90 day RFs until his appt on 9/7 to est care w/Dr Clelia CroftShaw. Former pt of Dr L and last seen in Feb. OK to give 90 day RFs since he has an appt sch?

## 2015-10-10 NOTE — Telephone Encounter (Signed)
Called pt and Left detailed message about RFs and advised that I sent back message to [facility] to send his Xarelto Rx req to his cardiologist, and that pt may want to f/up to make sure that is done. Also explained that pt should not be taking any NSAIDS w/Xarelto, therefore we did not RF the ibuprofen. Instr'd to take tylenol if needed and discuss w[facility]. Asked for CB w/any ?s.

## 2015-10-12 DIAGNOSIS — M1711 Unilateral primary osteoarthritis, right knee: Secondary | ICD-10-CM | POA: Diagnosis not present

## 2015-10-12 DIAGNOSIS — M1712 Unilateral primary osteoarthritis, left knee: Secondary | ICD-10-CM | POA: Diagnosis not present

## 2015-10-18 ENCOUNTER — Telehealth: Payer: Self-pay

## 2015-10-19 ENCOUNTER — Encounter: Payer: Medicare Other | Admitting: Internal Medicine

## 2015-10-22 ENCOUNTER — Other Ambulatory Visit: Payer: Self-pay | Admitting: Family Medicine

## 2015-10-22 DIAGNOSIS — F411 Generalized anxiety disorder: Secondary | ICD-10-CM

## 2015-10-24 ENCOUNTER — Encounter: Payer: Self-pay | Admitting: Internal Medicine

## 2015-10-31 ENCOUNTER — Ambulatory Visit (INDEPENDENT_AMBULATORY_CARE_PROVIDER_SITE_OTHER): Payer: Commercial Managed Care - HMO | Admitting: *Deleted

## 2015-10-31 DIAGNOSIS — I480 Paroxysmal atrial fibrillation: Secondary | ICD-10-CM | POA: Diagnosis not present

## 2015-10-31 DIAGNOSIS — Z950 Presence of cardiac pacemaker: Secondary | ICD-10-CM

## 2015-11-01 ENCOUNTER — Ambulatory Visit: Payer: Medicare Other | Admitting: Family Medicine

## 2015-11-01 NOTE — Progress Notes (Signed)
Remote pacemaker transmission.   

## 2015-11-02 ENCOUNTER — Encounter: Payer: Self-pay | Admitting: Cardiology

## 2015-11-09 DIAGNOSIS — J4520 Mild intermittent asthma, uncomplicated: Secondary | ICD-10-CM | POA: Diagnosis not present

## 2015-11-09 LAB — CUP PACEART REMOTE DEVICE CHECK
Date Time Interrogation Session: 20170915144115
Implantable Lead Implant Date: 20170306
Implantable Lead Location: 753859
Lead Channel Impedance Value: 400 Ohm
Lead Channel Setting Pacing Amplitude: 3.5 V
Lead Channel Setting Pacing Amplitude: 3.5 V
Lead Channel Setting Sensing Sensitivity: 4 mV
MDC IDC LEAD IMPLANT DT: 20170306
MDC IDC LEAD LOCATION: 753860
MDC IDC MSMT LEADCHNL RV IMPEDANCE VALUE: 610 Ohm
MDC IDC MSMT LEADCHNL RV SENSING INTR AMPL: 12 mV — AB
MDC IDC SET LEADCHNL RV PACING PULSEWIDTH: 0.4 ms
MDC IDC STAT BRADY RA PERCENT PACED: 1 % — AB
MDC IDC STAT BRADY RV PERCENT PACED: 26 %
Pulse Gen Model: 2240
Pulse Gen Serial Number: 7885711

## 2015-11-15 ENCOUNTER — Telehealth: Payer: Self-pay

## 2015-11-15 NOTE — Telephone Encounter (Signed)
[facility] faxed req for order and documentation for Sleep study, Oxygen/ PAP therapy, etc. Called pt to see if he had req'd anything from this company and he stated that he did not. He has O2 from [facility]. Faxed denial back to [facility].  Pt also asked about clonazepam Rx. Advised last one was faxed to [facility] on 8/15. Pt stated that he will check with them. He stated that "she will not give him his hydrocodone". I reminded pt that is because he is supposed to go to the pain clinic for that. Pt verbalized remembering this when I mentioned it and said he "can't remember anything anymore". Pt stated that he has the ph # to pain clinic and will call them for appt.

## 2015-11-20 ENCOUNTER — Ambulatory Visit: Payer: Medicare Other | Admitting: Family Medicine

## 2015-11-27 ENCOUNTER — Other Ambulatory Visit: Payer: Self-pay | Admitting: Physician Assistant

## 2015-11-28 NOTE — Telephone Encounter (Signed)
03/28/2015 last exam and labs.

## 2015-11-28 NOTE — Telephone Encounter (Signed)
He is due to come back for a diabetes recheck.  We can do 1 month of all of these medications and he needs to come back in that time.

## 2015-11-29 NOTE — Telephone Encounter (Signed)
Patient advised his meds have been  Sent to mail order. He had questions about his vicodin and clonazepam refills that were denied in July. I advised he needs an appointment to discuss and establish care. He states he will call back next week to come in.

## 2015-12-01 ENCOUNTER — Telehealth: Payer: Self-pay

## 2015-12-01 NOTE — Telephone Encounter (Signed)
Pt needs a refill on his hydrocodone and his klonopin.  He had to cancel his last two appointments due to not having transportation.  [phone]

## 2015-12-03 ENCOUNTER — Telehealth: Payer: Self-pay | Admitting: Emergency Medicine

## 2015-12-03 NOTE — Telephone Encounter (Signed)
Pt told he will need to establish care before prescribing medications

## 2015-12-04 ENCOUNTER — Other Ambulatory Visit: Payer: Self-pay | Admitting: Family Medicine

## 2015-12-04 DIAGNOSIS — F411 Generalized anxiety disorder: Secondary | ICD-10-CM

## 2015-12-09 DIAGNOSIS — J4520 Mild intermittent asthma, uncomplicated: Secondary | ICD-10-CM | POA: Diagnosis not present

## 2015-12-15 ENCOUNTER — Other Ambulatory Visit: Payer: Self-pay | Admitting: Physician Assistant

## 2015-12-18 ENCOUNTER — Encounter: Payer: Commercial Managed Care - HMO | Admitting: Internal Medicine

## 2015-12-20 ENCOUNTER — Other Ambulatory Visit: Payer: Self-pay | Admitting: Family Medicine

## 2015-12-20 DIAGNOSIS — F411 Generalized anxiety disorder: Secondary | ICD-10-CM

## 2015-12-27 ENCOUNTER — Telehealth: Payer: Self-pay

## 2015-12-27 NOTE — Telephone Encounter (Signed)
PT called to see if he can get a refill of clonazepam sent locally. I advised pt that we can not get him any more refills w/out an OV, but will be happy to see him if he can get in. Pt voiced understanding.

## 2016-01-04 ENCOUNTER — Other Ambulatory Visit: Payer: Self-pay | Admitting: Family Medicine

## 2016-01-09 DIAGNOSIS — J4520 Mild intermittent asthma, uncomplicated: Secondary | ICD-10-CM | POA: Diagnosis not present

## 2016-01-10 ENCOUNTER — Other Ambulatory Visit: Payer: Self-pay | Admitting: Family Medicine

## 2016-01-10 DIAGNOSIS — F411 Generalized anxiety disorder: Secondary | ICD-10-CM

## 2016-01-10 NOTE — Telephone Encounter (Signed)
rx of front. Mailbox full

## 2016-01-10 NOTE — Telephone Encounter (Signed)
Please let the patient know I am given him a partial supply he needs to be seen by his new PCP for this medication in the future.

## 2016-01-10 NOTE — Telephone Encounter (Signed)
03/2015 last ov 09/2015 last refill no additional.

## 2016-01-11 NOTE — Telephone Encounter (Signed)
Reached pt and advised he needs OV w/in 2 wks. Only can give him 2 wk supply of clonazepam and other meds that [facility] req[facility]. Pt [facility] and he will call and come in next week to get 90 day refills on the others. Rx was faxed by [facility].

## 2016-01-19 ENCOUNTER — Other Ambulatory Visit: Payer: Self-pay | Admitting: Physician Assistant

## 2016-01-19 ENCOUNTER — Other Ambulatory Visit: Payer: Self-pay | Admitting: Family Medicine

## 2016-01-19 DIAGNOSIS — F411 Generalized anxiety disorder: Secondary | ICD-10-CM

## 2016-02-06 ENCOUNTER — Other Ambulatory Visit: Payer: Self-pay | Admitting: Family Medicine

## 2016-02-06 DIAGNOSIS — F411 Generalized anxiety disorder: Secondary | ICD-10-CM

## 2016-02-07 ENCOUNTER — Other Ambulatory Visit: Payer: Self-pay | Admitting: Family Medicine

## 2016-02-08 DIAGNOSIS — J4520 Mild intermittent asthma, uncomplicated: Secondary | ICD-10-CM | POA: Diagnosis not present

## 2016-02-08 NOTE — Telephone Encounter (Signed)
Pt has been told that he needs to establish with a new provider since dr Milus GlazierLauenstein has left - I will only give hima few days worth

## 2016-02-08 NOTE — Telephone Encounter (Signed)
Pt is calling for a refill on medicines please call pt and [facility] I think this pt need to come into office for a OV medicines that need to be refilled are Metformin,Lancets Accu-check, Pravachol,Lotrel, and Accu check strips

## 2016-02-11 ENCOUNTER — Telehealth: Payer: Self-pay

## 2016-02-11 NOTE — Telephone Encounter (Signed)
Faxed and advised pt on VM that this is all we can do until he gets in to est w/new provider.

## 2016-02-11 NOTE — Telephone Encounter (Signed)
Pt is needing to talk with someone about why his meds are not being refilled   Best number [phone]

## 2016-02-12 ENCOUNTER — Encounter: Payer: Commercial Managed Care - HMO | Admitting: Internal Medicine

## 2016-02-12 ENCOUNTER — Other Ambulatory Visit: Payer: Self-pay

## 2016-02-12 MED ORDER — GLUCOSE BLOOD VI STRP: ORAL_STRIP | 0 refills | Status: AC

## 2016-02-12 NOTE — Telephone Encounter (Signed)
Spoke with pt and recommend that he RTC,. Informed him that his strips were ordered today and his metformin yesterday

## 2016-02-12 NOTE — Telephone Encounter (Signed)
Attempted to call pt, left VM for pt to call back  

## 2016-02-12 NOTE — Telephone Encounter (Signed)
Blood glucose strips ordered w/[facility] mail

## 2016-02-14 ENCOUNTER — Encounter: Payer: Self-pay | Admitting: Internal Medicine

## 2016-02-15 ENCOUNTER — Ambulatory Visit (INDEPENDENT_AMBULATORY_CARE_PROVIDER_SITE_OTHER): Payer: Commercial Managed Care - HMO | Admitting: *Deleted

## 2016-02-15 DIAGNOSIS — I480 Paroxysmal atrial fibrillation: Secondary | ICD-10-CM

## 2016-02-19 ENCOUNTER — Encounter: Payer: Self-pay | Admitting: Internal Medicine

## 2016-02-19 NOTE — Progress Notes (Signed)
Remote pacemaker transmission.   

## 2016-02-20 ENCOUNTER — Encounter: Payer: Self-pay | Admitting: Cardiology

## 2016-02-22 LAB — CUP PACEART REMOTE DEVICE CHECK
Battery Remaining Longevity: 86 mo
Battery Remaining Percentage: 95.5 %
Battery Voltage: 2.99 V
Brady Statistic AP VS Percent: 1 %
Brady Statistic AS VP Percent: 32 %
Date Time Interrogation Session: 20171223000120
Implantable Lead Implant Date: 20170306
Implantable Lead Location: 753860
Lead Channel Impedance Value: 400 Ohm
Lead Channel Pacing Threshold Amplitude: 0.75 V
Lead Channel Pacing Threshold Pulse Width: 0.4 ms
Lead Channel Sensing Intrinsic Amplitude: 3.8 mV
Lead Channel Setting Pacing Pulse Width: 0.4 ms
MDC IDC LEAD IMPLANT DT: 20170306
MDC IDC LEAD LOCATION: 753859
MDC IDC MSMT LEADCHNL RA PACING THRESHOLD AMPLITUDE: 0.75 V
MDC IDC MSMT LEADCHNL RV IMPEDANCE VALUE: 540 Ohm
MDC IDC MSMT LEADCHNL RV PACING THRESHOLD PULSEWIDTH: 0.4 ms
MDC IDC MSMT LEADCHNL RV SENSING INTR AMPL: 12 mV
MDC IDC PG IMPLANT DT: 20170306
MDC IDC SET LEADCHNL RA PACING AMPLITUDE: 3.5 V
MDC IDC SET LEADCHNL RV PACING AMPLITUDE: 3.5 V
MDC IDC SET LEADCHNL RV SENSING SENSITIVITY: 4 mV
MDC IDC STAT BRADY AP VP PERCENT: 1 %
MDC IDC STAT BRADY AS VS PERCENT: 67 %
MDC IDC STAT BRADY RA PERCENT PACED: 1 %
MDC IDC STAT BRADY RV PERCENT PACED: 25 %
Pulse Gen Model: 2240
Pulse Gen Serial Number: 7885711

## 2016-02-26 ENCOUNTER — Emergency Department (HOSPITAL_COMMUNITY): Payer: Medicare HMO

## 2016-02-26 ENCOUNTER — Emergency Department (HOSPITAL_COMMUNITY)
Admission: EM | Admit: 2016-02-26 | Discharge: 2016-02-26 | Disposition: A | Discharge status: Home / Self Care | Payer: Medicare HMO | Attending: Emergency Medicine | Admitting: Emergency Medicine

## 2016-02-26 ENCOUNTER — Encounter (HOSPITAL_COMMUNITY): Payer: Self-pay

## 2016-02-26 DIAGNOSIS — W228XXA Striking against or struck by other objects, initial encounter: Secondary | ICD-10-CM | POA: Insufficient documentation

## 2016-02-26 DIAGNOSIS — S0990XA Unspecified injury of head, initial encounter: Secondary | ICD-10-CM

## 2016-02-26 DIAGNOSIS — Z87891 Personal history of nicotine dependence: Secondary | ICD-10-CM | POA: Insufficient documentation

## 2016-02-26 DIAGNOSIS — Y999 Unspecified external cause status: Secondary | ICD-10-CM | POA: Insufficient documentation

## 2016-02-26 DIAGNOSIS — I10 Essential (primary) hypertension: Secondary | ICD-10-CM | POA: Insufficient documentation

## 2016-02-26 DIAGNOSIS — W19XXXA Unspecified fall, initial encounter: Secondary | ICD-10-CM

## 2016-02-26 DIAGNOSIS — T148XXA Other injury of unspecified body region, initial encounter: Secondary | ICD-10-CM | POA: Diagnosis not present

## 2016-02-26 DIAGNOSIS — M5489 Other dorsalgia: Secondary | ICD-10-CM | POA: Diagnosis not present

## 2016-02-26 DIAGNOSIS — M25551 Pain in right hip: Secondary | ICD-10-CM | POA: Diagnosis not present

## 2016-02-26 DIAGNOSIS — Z7984 Long term (current) use of oral hypoglycemic drugs: Secondary | ICD-10-CM | POA: Insufficient documentation

## 2016-02-26 DIAGNOSIS — R51 Headache: Secondary | ICD-10-CM | POA: Diagnosis not present

## 2016-02-26 DIAGNOSIS — E119 Type 2 diabetes mellitus without complications: Secondary | ICD-10-CM | POA: Insufficient documentation

## 2016-02-26 DIAGNOSIS — M25552 Pain in left hip: Secondary | ICD-10-CM | POA: Diagnosis not present

## 2016-02-26 DIAGNOSIS — Y939 Activity, unspecified: Secondary | ICD-10-CM | POA: Insufficient documentation

## 2016-02-26 DIAGNOSIS — S199XXA Unspecified injury of neck, initial encounter: Secondary | ICD-10-CM | POA: Diagnosis not present

## 2016-02-26 DIAGNOSIS — J45909 Unspecified asthma, uncomplicated: Secondary | ICD-10-CM | POA: Insufficient documentation

## 2016-02-26 DIAGNOSIS — Y92009 Unspecified place in unspecified non-institutional (private) residence as the place of occurrence of the external cause: Secondary | ICD-10-CM | POA: Insufficient documentation

## 2016-02-26 HISTORY — DX: Unspecified fall, initial encounter: W19.XXXA

## 2016-02-26 MED ORDER — HYDROCODONE-ACETAMINOPHEN 5-325 MG PO TABS: 1.0000 | ORAL_TABLET | Freq: Four times a day (QID) | ORAL | 0 refills | Status: AC | PRN

## 2016-02-26 NOTE — ED Notes (Signed)
ED Provider at bedside. 

## 2016-02-26 NOTE — ED Notes (Addendum)
Stood patient up to assist with urination. Patient voided into urinal approx. . Patient was initially unsteady but was able to stand without assistance after briefly sitting on the bedside.

## 2016-02-26 NOTE — ED Triage Notes (Signed)
Patient transported via [facility]. Per EMS patient fell at home. Patient reports stumbling and falling hitting his back and head. Denies LOC. Reports he laid on the floor for 12 hours. EMS confirms that's where he was found upon arrival.  BP 120/87, 72 HR. Patient reports he is taking Xarelto and Pravastatin.

## 2016-02-26 NOTE — ED Notes (Signed)
Went over DC teaching with daughter and pt., both verbalize understanding of need to follow up in regards to parotid growth that was found on CT. Pt driven home by family. NAD. VSS.

## 2016-02-26 NOTE — Discharge Instructions (Signed)
No acute injuries noted from the fall. CAT scan of the head and neck without evidence of any injuries. X-rays of the hips and pelvis without any fractures to the hips or pelvis. Stable for discharge home.  In addition CT scan results provided which showed evidence of concern for perhaps a mass in the parotid gland. Recommend follow-up with primary care doctor or in nose and throat for MRI. Family made aware.

## 2016-02-26 NOTE — ED Provider Notes (Addendum)
[place] DEPT Provider Note   CSN: [id number] Arrival date & time: 02/26/16  1513     History   Chief Complaint Chief Complaint  Patient presents with  . Fall    HPI Darin Gonzalez is a 76 y.o. male.  Patient status post fall at home unwitnessed occurred about 2 in the morning. Patient unable to get up. States he did strike the back of his head but had no loss of consciousness. Patient with complaint of headache and bilateral hip pain. EMS called out the patient never was ambulatory brought here for evaluation. Patient is on the blood thinners or alto for atrial fibrillation. Patient was on the floor for approximately 12 hours. 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      Past Medical History:  Diagnosis Date  . Diabetes mellitus without complication ([place])   . Hypercholesteremia   . Hypertension   . Paroxysmal atrial fibrillation with rapid ventricular response ([place]) 2010   in the setting of abdominal abscess    Patient Active Problem List   Diagnosis Date Noted  . PAF (paroxysmal atrial fibrillation) ([place]) 08/04/2015  . Complete heart block by electrocardiogram ([place]) 04/30/2015  . CHB (complete heart block) ([place]) 04/30/2015  . Complete heart block ([place]) 04/30/2015  . Hypertension   . Diabetes mellitus without complication ([place])   . Asthma with acute exacerbation 04/06/2014  . Diabetes mellitus type 2, noninsulin dependent ([place]) 03/05/2007  . Essential hypertension 03/05/2007  . GERD 03/05/2007  . BACK PAIN, CHRONIC 03/05/2007  . SEIZURE DISORDER 03/05/2007    Past Surgical History:  Procedure Laterality Date  . Abdominal percutaneous abscess drain  2010   abscess from ruptured appendix  . EP IMPLANTABLE DEVICE N/A 04/30/2015   Procedure: Pacemaker Implant;  Surgeon: Evans Lance, MD;  Location: [place] [facility];  Service: Cardiovascular;  Laterality: N/A;  . FINGER AMPUTATION         Home  Medications    Prior to Admission medications   Medication Sig Start Date End Date Taking? Authorizing Provider  ACCU-CHEK SOFTCLIX LANCETS lancets TEST BLOOD SUGAR 3 TIMES DAILY DUE TO FLUCTUATING BLOOD SUGARS. 02/11/16   Mancel Bale, PA-C  Alcohol Swabs (B-D SINGLE USE SWABS REGULAR) PADS Test blood sugar 3 times daily due to fluctuating blood sugar. Dx code: E11.9 03/28/15   [name], MD  Blood Glucose Calibration (ACCU-CHEK AVIVA) SOLN TEST BLOOD SUGAR 3 TIMES DAILY DUE TO FLUCTUATING BLOOD SUGARS.  01/20/16   [name], MD  blood glucose meter kit and supplies KIT Test blood sugar 3 times daily due to fluctuating blood sugar. Dx code: E11.9 03/28/15   [name], MD  Blood Glucose Monitoring Suppl (ACCU-CHEK AVIVA PLUS) w/Device KIT TEST BLOOD SUGAR 3 TIMES DAILY DUE TO FLUCTUATING BLOOD SUGARS 11/30/15   [name], MD  clonazePAM (KLONOPIN) 1 MG tablet TAKE 1 TABLET TWICE DAILY AS NEEDED FOR ANXIETY 02/08/16   Mancel Bale, PA-C  esomeprazole (NEXIUM) 40 MG capsule TAKE 1 CAPSULE EVERY DAY BEFORE BREAKFAST 02/11/16   Mancel Bale, PA-C  fluticasone [facility]) 50 MCG/ACT nasal spray USE 1 SPRAY IN EACH NOSTRIL EVERY DAY 02/11/16   Mancel Bale, PA-C  glucose blood (ACCU-CHEK AVIVA PLUS) test strip TEST BLOOD SUGAR 3 TIMES DAILY DUE TO FLUCTUATING BLOOD SUGARS. 02/12/16   Mancel Bale, PA-C  HYDROcodone-acetaminophen (NORCO) 7.5-325 MG tablet Take 1 tablet by mouth 3 (three) times  daily as needed for moderate pain. 06/25/15   [name], MD  ibuprofen (ADVIL,MOTRIN) 600 MG tablet TAKE 1 TABLET BY MOUTH  EVERY 8 HOURS AS NEEDED FOR PAIN. 07/18/15   Wardell Honour, MD  LOTREL 5-10 MG capsule TAKE 1 CAPSULE EVERY DAY 02/11/16   Mancel Bale, PA-C  metFORMIN (GLUCOPHAGE) 1000 MG tablet TAKE 1/2 TABLET TWICE DAILY WITH MEALS 02/11/16   Mancel Bale, PA-C  pravastatin (PRAVACHOL) 40 MG tablet TAKE 1 TABLET EVERY DAY 02/11/16   Mancel Bale, PA-C  rivaroxaban (XARELTO) 20  MG TABS tablet Take 1 tablet (20 mg total) by mouth daily with supper. 08/29/15   Evans Lance, MD  terbinafine (LAMISIL) 250 MG tablet Take 1 tablet (250 mg total) by mouth daily. 05/11/14   [name], MD  UNABLE TO FIND NEBULIZER MACHINE. DX CODE: 491.0 04/06/14   [name], MD  UNABLE TO FIND OXYGEN CONCENTRATOR AND BACK UP O2 TANK. NOCTURNAL USE, 2L. DX CODE: 491.0 04/06/14   [name], MD  UNABLE TO FIND Wrist blood pressure monitor. Dx code: I38. Use to check blood pressure as directed. 07/12/14   [name], MD  UNABLE TO FIND Pulse oximeter (finger). Dx code: Q68.341. Use to monitor O2 sat as directed. 07/12/14   [name], MD  VENTOLIN HFA 108 (90 Base) MCG/ACT inhaler INHALE 2 PUFFS  EVERY 6 HOURS AS NEEDED FOR COUGH, SHORTNESS OF BREATH OR WHEEZING (NEED OFFICE VISIT) 02/11/16   Mancel Bale, PA-C  zoster vaccine live, PF, (ZOSTAVAX) 96222 UNT/0.65ML injection Inject 19,400 Units into the skin once. 06/13/15   [name], MD    Family History Family History  Problem Relation Age of Onset  . Heart disease Mother   . Heart disease Father     Social History Social History  Substance Use Topics  . Smoking status: Former Smoker    Packs/day: 2.00    Years: 20.00    Types: Cigarettes    Quit date: 05/02/1979  . Smokeless tobacco: Current User    Types: Chew  . Alcohol use No     Allergies   Sulfonamide derivatives and Codeine   Review of Systems Review of Systems  Constitutional: Negative for fever.  HENT: Negative for congestion.   Eyes: Negative for visual disturbance.  Respiratory: Negative for shortness of breath.   Cardiovascular: Negative for chest pain.  Gastrointestinal: Negative for abdominal pain, nausea and vomiting.  Genitourinary: Negative for dysuria.  Musculoskeletal: Negative for back pain.  Skin: Negative for wound.  Neurological: Positive for weakness and headaches.  Hematological: Bruises/bleeds easily.    Psychiatric/Behavioral: Negative for confusion.     Physical Exam Updated Vital Signs BP 120/75 (BP Location: Right Arm)   Pulse 91   Temp 97.7 F (36.5 C) (Oral)   Resp 20   SpO2 98%   Physical Exam  Constitutional: He is oriented to person, place, and time. He appears well-developed and well-nourished. No distress.  HENT:  Head: Normocephalic.  Mouth/Throat: Oropharynx is clear and moist.  Eyes: EOM are normal. Pupils are equal, round, and reactive to light.  Neck: Normal range of motion. Neck supple.  Cardiovascular: Normal rate and regular rhythm.   Pulmonary/Chest: Effort normal and breath sounds normal. No respiratory distress.  Abdominal: Soft. Bowel sounds are normal. There is no tenderness.  Musculoskeletal: Normal range of motion. He exhibits no edema.  Patient with good movement of both legs. No obvious pain.   Neurological: He is alert and  oriented to person, place, and time. No cranial nerve deficit or sensory deficit. He exhibits normal muscle tone. Coordination normal.  Skin: Skin is warm.  Nursing note and vitals reviewed.    ED Treatments / Results  Labs (all labs ordered are listed, but only abnormal results are displayed) Labs Reviewed - No data to display  EKG  EKG Interpretation None       Radiology Ct Head Wo Contrast  Result Date: 02/26/2016 CLINICAL DATA:  Fall at home with head injury. EXAM: CT HEAD WITHOUT CONTRAST CT CERVICAL SPINE WITHOUT CONTRAST TECHNIQUE: Multidetector CT imaging of the head and cervical spine was performed following the standard protocol without intravenous contrast. Multiplanar CT image reconstructions of the cervical spine were also generated. COMPARISON:  None. FINDINGS: CT HEAD FINDINGS Brain: No evidence of parenchymal hemorrhage or extra-axial fluid collection. No mass lesion, mass effect, or midline shift. No CT evidence of acute transcortical infarction. Small left basal ganglia lacune of uncertain chronicity,  probably chronic . Generalized cerebral volume loss. Intracranial atherosclerosis. Nonspecific mild subcortical and periventricular white matter hypodensity, most in keeping with chronic small vessel ischemic change. No ventriculomegaly. Vascular: No hyperdense vessel or unexpected calcification. Skull: No evidence of calvarial fracture. Sinuses/Orbits: The visualized paranasal sinuses are essentially clear. Other: The right mastoid air cells are unopacified. Small inferior left mastoid effusion. CT CERVICAL SPINE FINDINGS Alignment: Normal cervical lordosis. No subluxation. Dens is well positioned between the lateral masses of C1. Skull base and vertebrae: No acute fracture. No primary bone lesion or focal pathologic process. Soft tissues and spinal canal: No prevertebral fluid or swelling. No visible canal hematoma. Disc levels: Mild-to-moderate degenerative disc disease in the mid to lower cervical spine, most prominent at C5-6. Moderate bilateral facet arthropathy. Mild to moderate degenerate foraminal stenosis on the right at C3-4. Upper chest: Negative. Other: Visualized right mastoid air cells appear clear. Nonspecific small inferior left mastoid effusion. No discrete thyroid nodules. 1.4 x 1.0 cm mass at the inferior margin of the right parotid gland (series 8/ image 39). IMPRESSION: 1. No evidence of acute intracranial abnormality. No evidence of calvarial fracture. 2. Nonspecific small left mastoid effusion. 3. Generalized cerebral volume loss. Mild chronic small vessel ischemia. 4. Small left basal ganglia lacune, probably chronic. 5. No cervical spine fracture or subluxation. 6. Mild-to-moderate degenerative changes in the cervical spine as described . 7. **An incidental finding of potential clinical significance has been found. Nonspecific 1.4 x 1.0 cm mass at the inferior margin of the right parotid gland, which may represent a mildly enlarged periparotid lymph node. Consider correlation with targeted  parotid ultrasound and/or MRI neck without and with IV contrast. ** Electronically Signed   By: Ilona Sorrel M.D.   On: 02/26/2016 18:05   Ct Cervical Spine Wo Contrast  Result Date: 02/26/2016 CLINICAL DATA:  Fall at home with head injury. EXAM: CT HEAD WITHOUT CONTRAST CT CERVICAL SPINE WITHOUT CONTRAST TECHNIQUE: Multidetector CT imaging of the head and cervical spine was performed following the standard protocol without intravenous contrast. Multiplanar CT image reconstructions of the cervical spine were also generated. COMPARISON:  None. FINDINGS: CT HEAD FINDINGS Brain: No evidence of parenchymal hemorrhage or extra-axial fluid collection. No mass lesion, mass effect, or midline shift. No CT evidence of acute transcortical infarction. Small left basal ganglia lacune of uncertain chronicity, probably chronic . Generalized cerebral volume loss. Intracranial atherosclerosis. Nonspecific mild subcortical and periventricular white matter hypodensity, most in keeping with chronic small vessel ischemic change. No ventriculomegaly. Vascular:  No hyperdense vessel or unexpected calcification. Skull: No evidence of calvarial fracture. Sinuses/Orbits: The visualized paranasal sinuses are essentially clear. Other: The right mastoid air cells are unopacified. Small inferior left mastoid effusion. CT CERVICAL SPINE FINDINGS Alignment: Normal cervical lordosis. No subluxation. Dens is well positioned between the lateral masses of C1. Skull base and vertebrae: No acute fracture. No primary bone lesion or focal pathologic process. Soft tissues and spinal canal: No prevertebral fluid or swelling. No visible canal hematoma. Disc levels: Mild-to-moderate degenerative disc disease in the mid to lower cervical spine, most prominent at C5-6. Moderate bilateral facet arthropathy. Mild to moderate degenerate foraminal stenosis on the right at C3-4. Upper chest: Negative. Other: Visualized right mastoid air cells appear clear.  Nonspecific small inferior left mastoid effusion. No discrete thyroid nodules. 1.4 x 1.0 cm mass at the inferior margin of the right parotid gland (series 8/ image 39). IMPRESSION: 1. No evidence of acute intracranial abnormality. No evidence of calvarial fracture. 2. Nonspecific small left mastoid effusion. 3. Generalized cerebral volume loss. Mild chronic small vessel ischemia. 4. Small left basal ganglia lacune, probably chronic. 5. No cervical spine fracture or subluxation. 6. Mild-to-moderate degenerative changes in the cervical spine as described . 7. **An incidental finding of potential clinical significance has been found. Nonspecific 1.4 x 1.0 cm mass at the inferior margin of the right parotid gland, which may represent a mildly enlarged periparotid lymph node. Consider correlation with targeted parotid ultrasound and/or MRI neck without and with IV contrast. ** Electronically Signed   By: Ilona Sorrel M.D.   On: 02/26/2016 18:05   Dg Hips Bilat W Or Wo Pelvis 2 Views  Result Date: 02/26/2016 CLINICAL DATA:  Fall today.  Bilateral hip pain. EXAM: DG HIP (WITH OR WITHOUT PELVIS) 2V BILAT COMPARISON:  12/27/2008 CT abdomen/pelvis . FINDINGS: No pelvic fracture or diastasis. No hip fracture or dislocation. 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 Spondylosis in the visualized lower lumbar spine. No suspicious focal osseous lesions. No radiopaque foreign body. IMPRESSION: No fracture. Electronically Signed   By: Ilona Sorrel M.D.   On: 02/26/2016 18:16    Procedures Procedures (including critical care time)  Medications Ordered in ED Medications - No data to display   Initial Impression / Assessment and Plan / ED Course  I have reviewed the triage vital signs and the nursing notes.  Pertinent labs & imaging results that were available during my care of the patient were reviewed by me and considered in my medical decision making (see chart for details).  Clinical  Course    Patient status post fall at home striking the back of his head. Point complaint of bilateral hip pain. X-rays of CT head and neck negative x-rays of pelvis and hips negative. Evidence of any acute injury. Patient stable for discharge home. Patient's vital signs without any abnormalities. Patient has family member that lives next door.   Final Clinical Impressions(s) / ED Diagnoses   Final diagnoses:  Fall, initial encounter  Fall in home, initial encounter  Injury of head, initial encounter    New Prescriptions New Prescriptions   No medications on file     Fredia Sorrow, MD 02/26/16 1850  Addendum: Patient and family made aware about the concern for possible parotid gland mass and the need for MRI follow-up with primary care doctor if your nose and throat. Referral information provided.    Fredia Sorrow, MD 02/26/16 1911

## 2016-02-27 ENCOUNTER — Inpatient Hospital Stay (HOSPITAL_COMMUNITY)
Admission: EM | Admit: 2016-02-27 | Discharge: 2016-03-27 | DRG: 308 | Disposition: E | Payer: Medicare HMO | Attending: Internal Medicine | Admitting: Internal Medicine

## 2016-02-27 ENCOUNTER — Emergency Department (HOSPITAL_COMMUNITY): Payer: Medicare HMO

## 2016-02-27 ENCOUNTER — Encounter (HOSPITAL_COMMUNITY): Payer: Self-pay | Admitting: Emergency Medicine

## 2016-02-27 DIAGNOSIS — I442 Atrioventricular block, complete: Secondary | ICD-10-CM | POA: Diagnosis not present

## 2016-02-27 DIAGNOSIS — J9690 Respiratory failure, unspecified, unspecified whether with hypoxia or hypercapnia: Secondary | ICD-10-CM

## 2016-02-27 DIAGNOSIS — Z515 Encounter for palliative care: Secondary | ICD-10-CM

## 2016-02-27 DIAGNOSIS — R29898 Other symptoms and signs involving the musculoskeletal system: Secondary | ICD-10-CM

## 2016-02-27 DIAGNOSIS — M1990 Unspecified osteoarthritis, unspecified site: Secondary | ICD-10-CM | POA: Diagnosis present

## 2016-02-27 DIAGNOSIS — R410 Disorientation, unspecified: Secondary | ICD-10-CM

## 2016-02-27 DIAGNOSIS — E11649 Type 2 diabetes mellitus with hypoglycemia without coma: Secondary | ICD-10-CM | POA: Diagnosis present

## 2016-02-27 DIAGNOSIS — L89101 Pressure ulcer of unspecified part of back, stage 1: Secondary | ICD-10-CM | POA: Diagnosis not present

## 2016-02-27 DIAGNOSIS — R2689 Other abnormalities of gait and mobility: Secondary | ICD-10-CM

## 2016-02-27 DIAGNOSIS — I4891 Unspecified atrial fibrillation: Secondary | ICD-10-CM | POA: Diagnosis not present

## 2016-02-27 DIAGNOSIS — I480 Paroxysmal atrial fibrillation: Secondary | ICD-10-CM | POA: Diagnosis not present

## 2016-02-27 DIAGNOSIS — I493 Ventricular premature depolarization: Secondary | ICD-10-CM | POA: Diagnosis present

## 2016-02-27 DIAGNOSIS — N179 Acute kidney failure, unspecified: Secondary | ICD-10-CM | POA: Diagnosis present

## 2016-02-27 DIAGNOSIS — M25461 Effusion, right knee: Secondary | ICD-10-CM | POA: Diagnosis present

## 2016-02-27 DIAGNOSIS — J9600 Acute respiratory failure, unspecified whether with hypoxia or hypercapnia: Secondary | ICD-10-CM | POA: Diagnosis not present

## 2016-02-27 DIAGNOSIS — M25569 Pain in unspecified knee: Secondary | ICD-10-CM

## 2016-02-27 DIAGNOSIS — R748 Abnormal levels of other serum enzymes: Secondary | ICD-10-CM

## 2016-02-27 DIAGNOSIS — R570 Cardiogenic shock: Secondary | ICD-10-CM | POA: Diagnosis not present

## 2016-02-27 DIAGNOSIS — E1122 Type 2 diabetes mellitus with diabetic chronic kidney disease: Secondary | ICD-10-CM | POA: Diagnosis present

## 2016-02-27 DIAGNOSIS — R531 Weakness: Secondary | ICD-10-CM | POA: Diagnosis not present

## 2016-02-27 DIAGNOSIS — I5042 Chronic combined systolic (congestive) and diastolic (congestive) heart failure: Secondary | ICD-10-CM | POA: Diagnosis present

## 2016-02-27 DIAGNOSIS — I4811 Longstanding persistent atrial fibrillation: Secondary | ICD-10-CM

## 2016-02-27 DIAGNOSIS — R55 Syncope and collapse: Secondary | ICD-10-CM

## 2016-02-27 DIAGNOSIS — Z7901 Long term (current) use of anticoagulants: Secondary | ICD-10-CM

## 2016-02-27 DIAGNOSIS — R404 Transient alteration of awareness: Secondary | ICD-10-CM | POA: Diagnosis not present

## 2016-02-27 DIAGNOSIS — E872 Acidosis: Secondary | ICD-10-CM | POA: Diagnosis present

## 2016-02-27 DIAGNOSIS — I248 Other forms of acute ischemic heart disease: Secondary | ICD-10-CM | POA: Diagnosis present

## 2016-02-27 DIAGNOSIS — I10 Essential (primary) hypertension: Secondary | ICD-10-CM | POA: Diagnosis not present

## 2016-02-27 DIAGNOSIS — K219 Gastro-esophageal reflux disease without esophagitis: Secondary | ICD-10-CM | POA: Diagnosis not present

## 2016-02-27 DIAGNOSIS — I959 Hypotension, unspecified: Secondary | ICD-10-CM

## 2016-02-27 DIAGNOSIS — Z91138 Patient's unintentional underdosing of medication regimen for other reason: Secondary | ICD-10-CM

## 2016-02-27 DIAGNOSIS — I472 Ventricular tachycardia: Secondary | ICD-10-CM | POA: Diagnosis not present

## 2016-02-27 DIAGNOSIS — Z23 Encounter for immunization: Secondary | ICD-10-CM | POA: Diagnosis present

## 2016-02-27 DIAGNOSIS — R634 Abnormal weight loss: Secondary | ICD-10-CM | POA: Diagnosis present

## 2016-02-27 DIAGNOSIS — I9589 Other hypotension: Secondary | ICD-10-CM | POA: Diagnosis not present

## 2016-02-27 DIAGNOSIS — F17220 Nicotine dependence, chewing tobacco, uncomplicated: Secondary | ICD-10-CM | POA: Diagnosis present

## 2016-02-27 DIAGNOSIS — Z950 Presence of cardiac pacemaker: Secondary | ICD-10-CM | POA: Diagnosis not present

## 2016-02-27 DIAGNOSIS — T45516A Underdosing of anticoagulants, initial encounter: Secondary | ICD-10-CM | POA: Diagnosis present

## 2016-02-27 DIAGNOSIS — E7800 Pure hypercholesterolemia, unspecified: Secondary | ICD-10-CM | POA: Diagnosis present

## 2016-02-27 DIAGNOSIS — G931 Anoxic brain damage, not elsewhere classified: Secondary | ICD-10-CM | POA: Diagnosis not present

## 2016-02-27 DIAGNOSIS — I469 Cardiac arrest, cause unspecified: Secondary | ICD-10-CM

## 2016-02-27 DIAGNOSIS — N189 Chronic kidney disease, unspecified: Secondary | ICD-10-CM | POA: Diagnosis present

## 2016-02-27 DIAGNOSIS — R0681 Apnea, not elsewhere classified: Secondary | ICD-10-CM

## 2016-02-27 DIAGNOSIS — Y92009 Unspecified place in unspecified non-institutional (private) residence as the place of occurrence of the external cause: Secondary | ICD-10-CM | POA: Diagnosis not present

## 2016-02-27 DIAGNOSIS — Z8249 Family history of ischemic heart disease and other diseases of the circulatory system: Secondary | ICD-10-CM

## 2016-02-27 DIAGNOSIS — J9601 Acute respiratory failure with hypoxia: Secondary | ICD-10-CM | POA: Diagnosis not present

## 2016-02-27 DIAGNOSIS — R7989 Other specified abnormal findings of blood chemistry: Secondary | ICD-10-CM | POA: Diagnosis not present

## 2016-02-27 DIAGNOSIS — I481 Persistent atrial fibrillation: Principal | ICD-10-CM | POA: Diagnosis present

## 2016-02-27 DIAGNOSIS — R221 Localized swelling, mass and lump, neck: Secondary | ICD-10-CM | POA: Diagnosis not present

## 2016-02-27 DIAGNOSIS — I951 Orthostatic hypotension: Secondary | ICD-10-CM | POA: Diagnosis not present

## 2016-02-27 DIAGNOSIS — R5383 Other fatigue: Secondary | ICD-10-CM | POA: Diagnosis not present

## 2016-02-27 DIAGNOSIS — I130 Hypertensive heart and chronic kidney disease with heart failure and stage 1 through stage 4 chronic kidney disease, or unspecified chronic kidney disease: Secondary | ICD-10-CM | POA: Diagnosis present

## 2016-02-27 DIAGNOSIS — I313 Pericardial effusion (noninflammatory): Secondary | ICD-10-CM | POA: Diagnosis not present

## 2016-02-27 DIAGNOSIS — Z66 Do not resuscitate: Secondary | ICD-10-CM | POA: Diagnosis not present

## 2016-02-27 DIAGNOSIS — E860 Dehydration: Secondary | ICD-10-CM | POA: Diagnosis present

## 2016-02-27 DIAGNOSIS — M79609 Pain in unspecified limb: Secondary | ICD-10-CM | POA: Diagnosis not present

## 2016-02-27 DIAGNOSIS — J690 Pneumonitis due to inhalation of food and vomit: Secondary | ICD-10-CM | POA: Diagnosis not present

## 2016-02-27 DIAGNOSIS — Z9289 Personal history of other medical treatment: Secondary | ICD-10-CM

## 2016-02-27 DIAGNOSIS — K119 Disease of salivary gland, unspecified: Secondary | ICD-10-CM | POA: Diagnosis present

## 2016-02-27 DIAGNOSIS — S299XXA Unspecified injury of thorax, initial encounter: Secondary | ICD-10-CM | POA: Diagnosis not present

## 2016-02-27 DIAGNOSIS — R778 Other specified abnormalities of plasma proteins: Secondary | ICD-10-CM

## 2016-02-27 DIAGNOSIS — E119 Type 2 diabetes mellitus without complications: Secondary | ICD-10-CM | POA: Diagnosis not present

## 2016-02-27 DIAGNOSIS — Z6832 Body mass index (BMI) 32.0-32.9, adult: Secondary | ICD-10-CM

## 2016-02-27 DIAGNOSIS — M6282 Rhabdomyolysis: Secondary | ICD-10-CM | POA: Diagnosis present

## 2016-02-27 DIAGNOSIS — I70221 Atherosclerosis of native arteries of extremities with rest pain, right leg: Secondary | ICD-10-CM | POA: Diagnosis not present

## 2016-02-27 DIAGNOSIS — Z7984 Long term (current) use of oral hypoglycemic drugs: Secondary | ICD-10-CM

## 2016-02-27 HISTORY — DX: Presence of cardiac pacemaker: Z95.0

## 2016-02-27 HISTORY — DX: Unspecified osteoarthritis, unspecified site: M19.90

## 2016-02-27 HISTORY — DX: Gastro-esophageal reflux disease without esophagitis: K21.9

## 2016-02-27 HISTORY — DX: Unspecified place in unspecified non-institutional (private) residence as the place of occurrence of the external cause: Y92.009

## 2016-02-27 HISTORY — DX: Type 2 diabetes mellitus without complications: E11.9

## 2016-02-27 HISTORY — DX: Unspecified fall, initial encounter: W19.XXXA

## 2016-02-27 LAB — URINALYSIS, ROUTINE W REFLEX MICROSCOPIC
Bilirubin Urine: NEGATIVE
GLUCOSE, UA: 50 mg/dL — AB
Ketones, ur: 5 mg/dL — AB
LEUKOCYTES UA: NEGATIVE
NITRITE: NEGATIVE
PROTEIN: NEGATIVE mg/dL
SPECIFIC GRAVITY, URINE: 1.013 (ref 1.005–1.030)
pH: 5 (ref 5.0–8.0)

## 2016-02-27 LAB — I-STAT CHEM 8, ED
BUN: 20 mg/dL (ref 6–20)
CHLORIDE: 100 mmol/L — AB (ref 101–111)
Calcium, Ion: 1.04 mmol/L — ABNORMAL LOW (ref 1.15–1.40)
Creatinine, Ser: 1.5 mg/dL — ABNORMAL HIGH (ref 0.61–1.24)
GLUCOSE: 90 mg/dL (ref 65–99)
HCT: 40 % (ref 39.0–52.0)
Hemoglobin: 13.6 g/dL (ref 13.0–17.0)
POTASSIUM: 4.1 mmol/L (ref 3.5–5.1)
Sodium: 135 mmol/L (ref 135–145)
TCO2: 24 mmol/L (ref 0–100)

## 2016-02-27 LAB — CBC WITH DIFFERENTIAL/PLATELET
BASOS ABS: 0 10*3/uL (ref 0.0–0.1)
Basophils Relative: 0 %
EOS PCT: 0 %
Eosinophils Absolute: 0 10*3/uL (ref 0.0–0.7)
HEMATOCRIT: 41.2 % (ref 39.0–52.0)
Hemoglobin: 13.9 g/dL (ref 13.0–17.0)
LYMPHS PCT: 7 %
Lymphs Abs: 0.8 10*3/uL (ref 0.7–4.0)
MCH: 30.9 pg (ref 26.0–34.0)
MCHC: 33.7 g/dL (ref 30.0–36.0)
MCV: 91.6 fL (ref 78.0–100.0)
MONO ABS: 0.6 10*3/uL (ref 0.1–1.0)
MONOS PCT: 5 %
NEUTROS ABS: 9.9 10*3/uL — AB (ref 1.7–7.7)
Neutrophils Relative %: 88 %
PLATELETS: 200 10*3/uL (ref 150–400)
RBC: 4.5 MIL/uL (ref 4.22–5.81)
RDW: 14.6 % (ref 11.5–15.5)
WBC: 11.4 10*3/uL — ABNORMAL HIGH (ref 4.0–10.5)

## 2016-02-27 LAB — CK: Total CK: 1019 U/L — ABNORMAL HIGH (ref 49–397)

## 2016-02-27 LAB — GLUCOSE, CAPILLARY: Glucose-Capillary: 97 mg/dL (ref 65–99)

## 2016-02-27 LAB — TYPE AND SCREEN
ABO/RH(D): O POS
ANTIBODY SCREEN: NEGATIVE

## 2016-02-27 LAB — COMPREHENSIVE METABOLIC PANEL
ALT: 16 U/L — ABNORMAL LOW (ref 17–63)
ANION GAP: 12 (ref 5–15)
AST: 44 U/L — AB (ref 15–41)
Albumin: 3.2 g/dL — ABNORMAL LOW (ref 3.5–5.0)
Alkaline Phosphatase: 51 U/L (ref 38–126)
BUN: 16 mg/dL (ref 6–20)
CHLORIDE: 104 mmol/L (ref 101–111)
CO2: 20 mmol/L — ABNORMAL LOW (ref 22–32)
Calcium: 8.2 mg/dL — ABNORMAL LOW (ref 8.9–10.3)
Creatinine, Ser: 1.66 mg/dL — ABNORMAL HIGH (ref 0.61–1.24)
GFR, EST AFRICAN AMERICAN: 45 mL/min — AB (ref >60)
GFR, EST NON AFRICAN AMERICAN: 39 mL/min — AB (ref >60)
Glucose, Bld: 89 mg/dL (ref 65–99)
POTASSIUM: 4.1 mmol/L (ref 3.5–5.1)
Sodium: 136 mmol/L (ref 135–145)
Total Bilirubin: 2.1 mg/dL — ABNORMAL HIGH (ref 0.3–1.2)
Total Protein: 5.8 g/dL — ABNORMAL LOW (ref 6.5–8.1)

## 2016-02-27 LAB — I-STAT CG4 LACTIC ACID, ED
LACTIC ACID, VENOUS: 2.21 mmol/L — AB (ref 0.5–1.9)
Lactic Acid, Venous: 1.36 mmol/L (ref 0.5–1.9)

## 2016-02-27 LAB — TROPONIN I
TROPONIN I: 0.23 ng/mL — AB (ref <0.03)
Troponin I: 0.31 ng/mL (ref <0.03)
Troponin I: 0.19 ng/mL (ref <0.03)

## 2016-02-27 LAB — PROTIME-INR
INR: 1.52
PROTHROMBIN TIME: 18.4 s — AB (ref 11.4–15.2)

## 2016-02-27 LAB — ABO/RH: ABO/RH(D): O POS

## 2016-02-27 LAB — TSH: TSH: 1.035 u[IU]/mL (ref 0.350–4.500)

## 2016-02-27 MED ORDER — ACETAMINOPHEN 650 MG RE SUPP: 650.0000 mg | Freq: Four times a day (QID) | RECTAL | Status: DC | PRN

## 2016-02-27 MED ORDER — SENNA 8.6 MG PO TABS
1.0000 | ORAL_TABLET | Freq: Two times a day (BID) | ORAL | Status: DC
Start: 2016-02-27 — End: 2016-03-05
  Administered 2016-02-27 – 2016-03-04 (×12): 8.6 mg via ORAL
  Filled 2016-02-27 (×13): qty 1

## 2016-02-27 MED ORDER — METOPROLOL TARTRATE 25 MG PO TABS
12.5000 mg | ORAL_TABLET | Freq: Two times a day (BID) | ORAL | Status: DC
  Administered 2016-02-28: 12.5 mg via ORAL
  Filled 2016-02-27 (×2): qty 1

## 2016-02-27 MED ORDER — ACETAMINOPHEN 325 MG PO TABS
650.0000 mg | ORAL_TABLET | Freq: Four times a day (QID) | ORAL | Status: DC | PRN
  Administered 2016-03-01 – 2016-03-04 (×4): 650 mg via ORAL
  Filled 2016-02-27 (×6): qty 2

## 2016-02-27 MED ORDER — FLUTICASONE PROPIONATE 50 MCG/ACT NA SUSP
2.0000 | Freq: Every day | NASAL | Status: DC
  Administered 2016-02-27 – 2016-03-04 (×7): 2 via NASAL
  Filled 2016-02-27: qty 16

## 2016-02-27 MED ORDER — SODIUM CHLORIDE 0.9 % IV BOLUS (SEPSIS)
1000.0000 mL | Freq: Once | INTRAVENOUS | Status: AC
  Administered 2016-02-27: 1000 mL via INTRAVENOUS

## 2016-02-27 MED ORDER — INFLUENZA VAC SPLIT QUAD 0.5 ML IM SUSY
0.5000 mL | PREFILLED_SYRINGE | INTRAMUSCULAR | Status: AC
  Administered 2016-02-28: 0.5 mL via INTRAMUSCULAR
  Filled 2016-02-27: qty 0.5

## 2016-02-27 MED ORDER — RIVAROXABAN 20 MG PO TABS
20.0000 mg | ORAL_TABLET | Freq: Every day | ORAL | Status: DC
  Administered 2016-02-27 – 2016-03-03 (×6): 20 mg via ORAL
  Filled 2016-02-27 (×7): qty 1

## 2016-02-27 MED ORDER — PANTOPRAZOLE SODIUM 40 MG PO TBEC
40.0000 mg | DELAYED_RELEASE_TABLET | Freq: Every day | ORAL | Status: DC
  Administered 2016-02-28 – 2016-03-04 (×6): 40 mg via ORAL
  Filled 2016-02-27 (×6): qty 1

## 2016-02-27 MED ORDER — INSULIN ASPART 100 UNIT/ML ~~LOC~~ SOLN
0.0000 [IU] | Freq: Three times a day (TID) | SUBCUTANEOUS | Status: DC
Start: 2016-02-27 — End: 2016-02-28
  Administered 2016-02-28: 1 [IU] via SUBCUTANEOUS

## 2016-02-27 MED ORDER — SODIUM CHLORIDE 0.9 % IV SOLN
INTRAVENOUS | Status: AC
  Administered 2016-02-27: 23:00:00 via INTRAVENOUS

## 2016-02-27 MED ORDER — ENSURE ENLIVE PO LIQD
237.0000 mL | Freq: Two times a day (BID) | ORAL | Status: DC
  Administered 2016-02-28 – 2016-03-02 (×5): 237 mL via ORAL

## 2016-02-27 MED ORDER — PRAVASTATIN SODIUM 40 MG PO TABS
40.0000 mg | ORAL_TABLET | Freq: Every day | ORAL | Status: DC
  Administered 2016-02-27 – 2016-03-04 (×7): 40 mg via ORAL
  Filled 2016-02-27 (×7): qty 1

## 2016-02-27 MED ORDER — SODIUM CHLORIDE 0.9% FLUSH
3.0000 mL | Freq: Two times a day (BID) | INTRAVENOUS | Status: DC
  Administered 2016-02-27 – 2016-03-04 (×10): 3 mL via INTRAVENOUS

## 2016-02-27 MED ORDER — PROMETHAZINE HCL 25 MG PO TABS
12.5000 mg | ORAL_TABLET | Freq: Four times a day (QID) | ORAL | Status: DC | PRN
Start: 2016-02-27 — End: 2016-03-04

## 2016-02-27 NOTE — H&P (Signed)
Date: 03/19/2016               Patient Name:  Darin Gonzalez MRN: [id number]  DOB: Jul 26, 1940 Age / Sex: 76 y.o., male   PCP: Pcp Not In System         Medical Service: Internal Medicine Teaching Service         Attending Physician: Dr. Sid Falcon, MD    First Contact: Dr. Asencion Partridge Pager: [phone]  Second Contact: Dr. Tiburcio Pea Pager: [phone]       After Hours (After 5p/  First Contact Pager: [phone]  weekends / holidays): Second Contact Pager: [phone]   Chief Complaint: dehydration  History of Present Illness:  76 yo man with history of DM, HTN, HLD, pAFib, history of complete heart block, now with a pacemaker by Dr Lovena Le on April 30 2015  who presents back to the ER for dehydration and hypotension He came to the ER yesterday after a fall at 2 AM, and he was down for about 12 hours, later came to the ER, and at that time CT head was negative, and Hip xrays were neg for any fracture.  Pt says that at 2 AM, he had gotten up from sleep, and went to the kitchen, and there was a puddle of water on the floor, and he slipped, and was down. He denied any prodromal symptoms. He denies any LOC at that time or during the 12 hours. He denies any palpitations or headaches, or dyspnea. He denies any head injury.  Then today, he called EMS, because he was not 'feeling good'. Since being discharged the day prior, he was not able to eat or drink anything. He denies any chest pain, dyspnea, shoulder pain, palpitations, or headaches but continues to have hip pain which is mild. Denies nausea, vomiting, abd pain, but has some joint pain. He was found to be hypotensive at 82/50, in afib with HR of 60. Received several litres of fluid boluses.    In the ER, pertinent positives were troponin of 0.31, EKG showing some T wave abnormalities. A CK was mildly elevated at 1000. Cr elevated at 1.66. Mild leukocytosis.    Meds:  Current Meds  Medication Sig  . ACCU-CHEK SOFTCLIX LANCETS lancets TEST  BLOOD SUGAR 3 TIMES DAILY DUE TO FLUCTUATING BLOOD SUGARS.  Marland Kitchen Alcohol Swabs (B-D SINGLE USE SWABS REGULAR) PADS Test blood sugar 3 times daily due to fluctuating blood sugar. Dx code: E11.9  . Blood Glucose Calibration (ACCU-CHEK AVIVA) SOLN TEST BLOOD SUGAR 3 TIMES DAILY DUE TO FLUCTUATING BLOOD SUGARS.   Marland Kitchen blood glucose meter kit and supplies KIT Test blood sugar 3 times daily due to fluctuating blood sugar. Dx code: E11.9  . Blood Glucose Monitoring Suppl (ACCU-CHEK AVIVA PLUS) w/Device KIT TEST BLOOD SUGAR 3 TIMES DAILY DUE TO FLUCTUATING BLOOD SUGARS  . clonazePAM (KLONOPIN) 1 MG tablet TAKE 1 TABLET TWICE DAILY AS NEEDED FOR ANXIETY  . esomeprazole (NEXIUM) 40 MG capsule TAKE 1 CAPSULE EVERY DAY BEFORE BREAKFAST  . fluticasone (FLONASE) 50 MCG/ACT nasal spray USE 1 SPRAY IN EACH NOSTRIL EVERY DAY  . glucose blood (ACCU-CHEK AVIVA PLUS) test strip TEST BLOOD SUGAR 3 TIMES DAILY DUE TO FLUCTUATING BLOOD SUGARS.  Marland Kitchen HYDROcodone-acetaminophen (NORCO) 7.5-325 MG tablet Take 1 tablet by mouth 3 (three) times daily as needed for moderate pain.  Marland Kitchen ibuprofen (ADVIL,MOTRIN) 600 MG tablet TAKE 1 TABLET BY MOUTH  EVERY 8 HOURS AS NEEDED FOR PAIN.  Marland Kitchen LOTREL  5-10 MG capsule TAKE 1 CAPSULE EVERY DAY  . metFORMIN (GLUCOPHAGE) 1000 MG tablet TAKE 1/2 TABLET TWICE DAILY WITH MEALS (Patient taking differently: TAKE 1/2 TABLE=500MG BY MOUTH TWICE DAILY WITH MEALS)  . pravastatin (PRAVACHOL) 40 MG tablet TAKE 1 TABLET EVERY DAY  . rivaroxaban (XARELTO) 20 MG TABS tablet Take 1 tablet (20 mg total) by mouth daily with supper.  Marland Kitchen UNABLE TO FIND NEBULIZER MACHINE. DX CODE: 491.0  . UNABLE TO FIND OXYGEN CONCENTRATOR AND BACK UP O2 TANK. NOCTURNAL USE, 2L. DX CODE: 491.0  . UNABLE TO FIND Wrist blood pressure monitor. Dx code: I44. Use to check blood pressure as directed.  Marland Kitchen UNABLE TO FIND Pulse oximeter (finger). Dx code: T61.443. Use to monitor O2 sat as directed.  . VENTOLIN HFA 108 (90 Base) MCG/ACT inhaler  INHALE 2 PUFFS  EVERY 6 HOURS AS NEEDED FOR COUGH, SHORTNESS OF BREATH OR WHEEZING (NEED OFFICE VISIT)     Allergies: Allergies as of 03/15/2016 - Review Complete 02/26/2016  Allergen Reaction Noted  . Sulfonamide derivatives Shortness Of Breath   . Codeine     Past Medical History:  Diagnosis Date  . Arthritis    "back" (03/23/2016)  . Fall at home 02/26/2016   striking the back of his head  . GERD (gastroesophageal reflux disease)   . Hypercholesteremia   . Hypertension   . Paroxysmal atrial fibrillation with rapid ventricular response ([facility]) 2010   in the setting of abdominal abscess  . Presence of permanent cardiac pacemaker   . 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  Former smoker quit 1981 No alcohol or illicit drug use  Review of Systems: A complete ROS was negative except as per HPI.   Physical Exam: Blood pressure 102/67, pulse 66, temperature 97.5 F (36.4 C), temperature source Oral, resp. rate 18, height '5\' 8"'  (1.727 m), weight 214 lb 8 oz (97.3 kg), SpO2 97 %.  Physical Exam  General:  A&O,  Resting in bed, no obvious signs of injury or bruising  HEENT: PERRLA, no ear/nose/throat erythema or exudates Neck: supple, no lymphadenopathy CV: irr irr rate and rhythm in afib, no murmurs or rubs appreciated. Resp: equal and symmetric breath sounds, no wheezing or crackles. Abdomen: soft, nontender, nondistended, +BS in all 4 quadrants Skin: warm, dry, intact, no open lesions or atypical rash noted Extremities: pulses intact b/l, no edema Neurologic: Patient is alert and oriented x3, and no gross deficits noted    EKG: Independently reviewed by me and showing Afib, and prolonged QT interval , some T wave differences   CXR: independently reviewed by me and negative for any airspace disease or consolidation, or pulm  edema.   Assessment & Plan by Problem: Active Problems:   Dehydration  Hypotension and dehydration, with earlier presentation for fall:Presentation this time likely due to poor PO intake. S/p 3 L boluses and currently on NS 125 cc/hr.  It is unclear why the patient fell, as possibly he had cardiogenic syncope due to his history of complete heart block, and afib. He currently has a pacemaker , and it will need to be interrogated. CT head and xrays of the hip were unremarkable. HgB was stable.   -tele -continue NS 125 cc/hr  -consult EP in AM for device interrogation  -PT and OT eval and treat  Afib: Pt is on Xarelto. Not rate controlled. HR from 46-146. CHAD2VASC score of at least 4. TSH normal.  -As no evidence of bleeding and Hgb as baseline, continue xarelto -start metoprolol 12.5 mg BID  Elevated troponins: 0.31-->0.23-->0.19. Likely demand ischemia/ afib precipitation Pt has no chest pain. 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 Pt on metformin  -check A1c -SSI-S TID WC  HTN: BP normotensive -started metoprolol for rate control  Dispo: Admit patient to Observation with expected length of stay less than 2 midnights.  Signed: Burgess Estelle, MD 03/24/2016, 11:03 PM

## 2016-02-27 NOTE — ED Provider Notes (Signed)
Chloride 100 (*)    Creatinine, Ser 1.50 (*)    Calcium, Ion 1.04 (*)    All other  components within normal limits  I-STAT CG4 LACTIC ACID, ED - Abnormal; Notable for the following:    Lactic Acid, Venous 2.21 (*)    All other components within normal limits  URINE CULTURE  TSH  GLUCOSE, CAPILLARY  TROPONIN I  HEMOGLOBIN A1C  COMPREHENSIVE METABOLIC PANEL  CBC  I-STAT CG4 LACTIC ACID, ED  TYPE AND SCREEN  ABO/RH    EKG  EKG Interpretation  Date/Time:  Wednesday February 27 2016 09:56:54 EST Ventricular Rate:  136 PR Interval:    QRS Duration: 134 QT Interval:  453 QTC Calculation: 506 R Axis:   63 Text Interpretation:  Sinus tachycardia Right bundle branch block Abnormal T, consider ischemia, lateral leads Baseline wander in lead(s) V2 TW inversions new from prior Confirmed by [facility] MD, Edrees Valent ([id number]) on 03/13/2016 12:36:18 PM       Radiology Ct Head Wo Contrast  Result Date: 02/26/2016 CLINICAL DATA:  Fall at home with head injury. EXAM: CT HEAD WITHOUT CONTRAST CT CERVICAL SPINE WITHOUT CONTRAST TECHNIQUE: Multidetector CT imaging of the head and cervical spine was performed following the standard protocol without intravenous contrast. Multiplanar CT image reconstructions of the cervical spine were also generated. COMPARISON:  None. FINDINGS: CT HEAD FINDINGS Brain: No evidence of parenchymal hemorrhage or extra-axial fluid collection. No mass lesion, mass effect, or midline shift. No CT evidence of acute transcortical infarction. Small left basal ganglia lacune of uncertain chronicity, probably chronic . Generalized cerebral volume loss. Intracranial atherosclerosis. Nonspecific mild subcortical and periventricular white matter hypodensity, most in keeping with chronic small vessel ischemic change. No ventriculomegaly. Vascular: No hyperdense vessel or unexpected calcification. Skull: No evidence of calvarial fracture. Sinuses/Orbits: The visualized paranasal sinuses are essentially clear. Other: The right mastoid air cells are unopacified. Small inferior left  mastoid effusion. CT CERVICAL SPINE FINDINGS Alignment: Normal cervical lordosis. No subluxation. Dens is well positioned between the lateral masses of C1. Skull base and vertebrae: No acute fracture. No primary bone lesion or focal pathologic process. Soft tissues and spinal canal: No prevertebral fluid or swelling. No visible canal hematoma. Disc levels: Mild-to-moderate degenerative disc disease in the mid to lower cervical spine, most prominent at C5-6. Moderate bilateral facet arthropathy. Mild to moderate degenerate foraminal stenosis on the right at C3-4. Upper chest: Negative. Other: Visualized right mastoid air cells appear clear. Nonspecific small inferior left mastoid effusion. No discrete thyroid nodules. 1.4 x 1.0 cm mass at the inferior margin of the right parotid gland (series 8/ image 39). IMPRESSION: 1. No evidence of acute intracranial abnormality. No evidence of calvarial fracture. 2. Nonspecific small left mastoid effusion. 3. Generalized cerebral volume loss. Mild chronic small vessel ischemia. 4. Small left basal ganglia lacune, probably chronic. 5. No cervical spine fracture or subluxation. 6. Mild-to-moderate degenerative changes in the cervical spine as described . 7. **An incidental finding of potential clinical significance has been found. Nonspecific 1.4 x 1.0 cm mass at the inferior margin of the right parotid gland, which may represent a mildly enlarged periparotid lymph node. Consider correlation with targeted parotid ultrasound and/or MRI neck without and with IV contrast. ** Electronically Signed   By: Ilona Sorrel M.D.   On: 02/26/2016 18:05   Ct Cervical Spine Wo Contrast  Result Date: 02/26/2016 CLINICAL DATA:  Fall at home with head injury. EXAM: CT HEAD WITHOUT CONTRAST CT CERVICAL SPINE WITHOUT CONTRAST TECHNIQUE:  Chloride 100 (*)    Creatinine, Ser 1.50 (*)    Calcium, Ion 1.04 (*)    All other  components within normal limits  I-STAT CG4 LACTIC ACID, ED - Abnormal; Notable for the following:    Lactic Acid, Venous 2.21 (*)    All other components within normal limits  URINE CULTURE  TSH  GLUCOSE, CAPILLARY  TROPONIN I  HEMOGLOBIN A1C  COMPREHENSIVE METABOLIC PANEL  CBC  I-STAT CG4 LACTIC ACID, ED  TYPE AND SCREEN  ABO/RH    EKG  EKG Interpretation  Date/Time:  Wednesday February 27 2016 09:56:54 EST Ventricular Rate:  136 PR Interval:    QRS Duration: 134 QT Interval:  453 QTC Calculation: 506 R Axis:   63 Text Interpretation:  Sinus tachycardia Right bundle branch block Abnormal T, consider ischemia, lateral leads Baseline wander in lead(s) V2 TW inversions new from prior Confirmed by [facility] MD, Edrees Valent ([id number]) on 03/13/2016 12:36:18 PM       Radiology Ct Head Wo Contrast  Result Date: 02/26/2016 CLINICAL DATA:  Fall at home with head injury. EXAM: CT HEAD WITHOUT CONTRAST CT CERVICAL SPINE WITHOUT CONTRAST TECHNIQUE: Multidetector CT imaging of the head and cervical spine was performed following the standard protocol without intravenous contrast. Multiplanar CT image reconstructions of the cervical spine were also generated. COMPARISON:  None. FINDINGS: CT HEAD FINDINGS Brain: No evidence of parenchymal hemorrhage or extra-axial fluid collection. No mass lesion, mass effect, or midline shift. No CT evidence of acute transcortical infarction. Small left basal ganglia lacune of uncertain chronicity, probably chronic . Generalized cerebral volume loss. Intracranial atherosclerosis. Nonspecific mild subcortical and periventricular white matter hypodensity, most in keeping with chronic small vessel ischemic change. No ventriculomegaly. Vascular: No hyperdense vessel or unexpected calcification. Skull: No evidence of calvarial fracture. Sinuses/Orbits: The visualized paranasal sinuses are essentially clear. Other: The right mastoid air cells are unopacified. Small inferior left  mastoid effusion. CT CERVICAL SPINE FINDINGS Alignment: Normal cervical lordosis. No subluxation. Dens is well positioned between the lateral masses of C1. Skull base and vertebrae: No acute fracture. No primary bone lesion or focal pathologic process. Soft tissues and spinal canal: No prevertebral fluid or swelling. No visible canal hematoma. Disc levels: Mild-to-moderate degenerative disc disease in the mid to lower cervical spine, most prominent at C5-6. Moderate bilateral facet arthropathy. Mild to moderate degenerate foraminal stenosis on the right at C3-4. Upper chest: Negative. Other: Visualized right mastoid air cells appear clear. Nonspecific small inferior left mastoid effusion. No discrete thyroid nodules. 1.4 x 1.0 cm mass at the inferior margin of the right parotid gland (series 8/ image 39). IMPRESSION: 1. No evidence of acute intracranial abnormality. No evidence of calvarial fracture. 2. Nonspecific small left mastoid effusion. 3. Generalized cerebral volume loss. Mild chronic small vessel ischemia. 4. Small left basal ganglia lacune, probably chronic. 5. No cervical spine fracture or subluxation. 6. Mild-to-moderate degenerative changes in the cervical spine as described . 7. **An incidental finding of potential clinical significance has been found. Nonspecific 1.4 x 1.0 cm mass at the inferior margin of the right parotid gland, which may represent a mildly enlarged periparotid lymph node. Consider correlation with targeted parotid ultrasound and/or MRI neck without and with IV contrast. ** Electronically Signed   By: Ilona Sorrel M.D.   On: 02/26/2016 18:05   Ct Cervical Spine Wo Contrast  Result Date: 02/26/2016 CLINICAL DATA:  Fall at home with head injury. EXAM: CT HEAD WITHOUT CONTRAST CT CERVICAL SPINE WITHOUT CONTRAST TECHNIQUE:  [facility] DEPT Provider Note   CSN: [id number] Arrival date & time: 03/14/2016  0946     History   Chief Complaint Chief Complaint  Patient presents with  . Weakness    HPI AMARIEN CARNE is a 76 y.o. male.  HPI  Presents with concern for generalized weakness Is alert./oriented Had fall yesterday 2AM and was on ground until 2PM.  Has not been eating or drinking over last 2 days.  Now feeling fatigued, generally weak.  Notes continued bilateral hip pain and general soreness.  No fevers, no urinary symptoms, no diarrhea, no black/bloody stool, o back pain, no urinary symptoms. No chest pain, no LOC, no dyspnea, no cough no congestion.  Past Medical History:  Diagnosis Date  . Arthritis    "back" (03/22/2016)  . Fall at home 02/26/2016   striking the back of his head  . GERD (gastroesophageal reflux disease)   . Hypercholesteremia   . Hypertension   . Paroxysmal atrial fibrillation with rapid ventricular response ([place]) 2010   in the setting of abdominal abscess  . Presence of permanent cardiac pacemaker   . Type II diabetes mellitus [facility])     Patient Active Problem List   Diagnosis Date Noted  . Dehydration 03/07/2016  . PAF (paroxysmal atrial fibrillation) ([place]) 08/04/2015  . Complete heart block by electrocardiogram ([place]) 04/30/2015  . CHB (complete heart block) ([place]) 04/30/2015  . Complete heart block ([place]) 04/30/2015  . Hypertension   . Diabetes mellitus without complication ([place])   . Asthma with acute exacerbation 04/06/2014  . Diabetes mellitus type 2, noninsulin dependent ([place]) 03/05/2007  . Essential hypertension 03/05/2007  . GERD 03/05/2007  . BACK PAIN, CHRONIC 03/05/2007  . SEIZURE DISORDER 03/05/2007    Past Surgical History:  Procedure Laterality Date  . Abdominal percutaneous abscess drain  2010   abscess from ruptured appendix  . APPENDECTOMY  2010  . EP IMPLANTABLE DEVICE N/A 04/30/2015   Procedure: Pacemaker Implant;  Surgeon: Evans Lance, MD;  Location: [place] [facility] LAB;  Service: Cardiovascular;  Laterality: N/A;  . FINGER AMPUTATION    . HEMORRHOID BANDING    . TONSILLECTOMY  1947       Home Medications    Prior to Admission medications   Medication Sig Start Date End Date Taking? Authorizing Provider  ACCU-CHEK SOFTCLIX LANCETS lancets TEST BLOOD SUGAR 3 TIMES DAILY DUE TO FLUCTUATING BLOOD SUGARS. 02/11/16  Yes Mancel Bale, PA-C  Alcohol Swabs (B-D SINGLE USE SWABS REGULAR) PADS Test blood sugar 3 times daily due to fluctuating blood sugar. Dx code: E11.9 03/28/15  Yes [name], MD  Blood Glucose Calibration (ACCU-CHEK AVIVA) SOLN TEST BLOOD SUGAR 3 TIMES DAILY DUE TO FLUCTUATING BLOOD SUGARS.  01/20/16  Yes [name], MD  blood glucose meter kit and supplies KIT Test blood sugar 3 times daily due to fluctuating blood sugar. Dx code: E11.9 03/28/15  Yes [name], MD  Blood Glucose Monitoring Suppl (ACCU-CHEK AVIVA PLUS) w/Device KIT TEST BLOOD SUGAR 3 TIMES DAILY DUE TO FLUCTUATING BLOOD SUGARS 11/30/15  Yes [name], MD  clonazePAM (KLONOPIN) 1 MG tablet TAKE 1 TABLET TWICE DAILY AS NEEDED FOR ANXIETY 02/08/16  Yes Mancel Bale, PA-C  esomeprazole (NEXIUM) 40 MG capsule TAKE 1 CAPSULE EVERY DAY BEFORE BREAKFAST 02/11/16  Yes Mancel Bale, PA-C  fluticasone (FLONASE) 50 MCG/ACT nasal spray USE 1 SPRAY IN EACH NOSTRIL EVERY DAY 02/11/16  Yes Mancel Bale, PA-C  glucose blood (ACCU-CHEK AVIVA  Chloride 100 (*)    Creatinine, Ser 1.50 (*)    Calcium, Ion 1.04 (*)    All other  components within normal limits  I-STAT CG4 LACTIC ACID, ED - Abnormal; Notable for the following:    Lactic Acid, Venous 2.21 (*)    All other components within normal limits  URINE CULTURE  TSH  GLUCOSE, CAPILLARY  TROPONIN I  HEMOGLOBIN A1C  COMPREHENSIVE METABOLIC PANEL  CBC  I-STAT CG4 LACTIC ACID, ED  TYPE AND SCREEN  ABO/RH    EKG  EKG Interpretation  Date/Time:  Wednesday February 27 2016 09:56:54 EST Ventricular Rate:  136 PR Interval:    QRS Duration: 134 QT Interval:  453 QTC Calculation: 506 R Axis:   63 Text Interpretation:  Sinus tachycardia Right bundle branch block Abnormal T, consider ischemia, lateral leads Baseline wander in lead(s) V2 TW inversions new from prior Confirmed by [facility] MD, Edrees Valent ([id number]) on 03/13/2016 12:36:18 PM       Radiology Ct Head Wo Contrast  Result Date: 02/26/2016 CLINICAL DATA:  Fall at home with head injury. EXAM: CT HEAD WITHOUT CONTRAST CT CERVICAL SPINE WITHOUT CONTRAST TECHNIQUE: Multidetector CT imaging of the head and cervical spine was performed following the standard protocol without intravenous contrast. Multiplanar CT image reconstructions of the cervical spine were also generated. COMPARISON:  None. FINDINGS: CT HEAD FINDINGS Brain: No evidence of parenchymal hemorrhage or extra-axial fluid collection. No mass lesion, mass effect, or midline shift. No CT evidence of acute transcortical infarction. Small left basal ganglia lacune of uncertain chronicity, probably chronic . Generalized cerebral volume loss. Intracranial atherosclerosis. Nonspecific mild subcortical and periventricular white matter hypodensity, most in keeping with chronic small vessel ischemic change. No ventriculomegaly. Vascular: No hyperdense vessel or unexpected calcification. Skull: No evidence of calvarial fracture. Sinuses/Orbits: The visualized paranasal sinuses are essentially clear. Other: The right mastoid air cells are unopacified. Small inferior left  mastoid effusion. CT CERVICAL SPINE FINDINGS Alignment: Normal cervical lordosis. No subluxation. Dens is well positioned between the lateral masses of C1. Skull base and vertebrae: No acute fracture. No primary bone lesion or focal pathologic process. Soft tissues and spinal canal: No prevertebral fluid or swelling. No visible canal hematoma. Disc levels: Mild-to-moderate degenerative disc disease in the mid to lower cervical spine, most prominent at C5-6. Moderate bilateral facet arthropathy. Mild to moderate degenerate foraminal stenosis on the right at C3-4. Upper chest: Negative. Other: Visualized right mastoid air cells appear clear. Nonspecific small inferior left mastoid effusion. No discrete thyroid nodules. 1.4 x 1.0 cm mass at the inferior margin of the right parotid gland (series 8/ image 39). IMPRESSION: 1. No evidence of acute intracranial abnormality. No evidence of calvarial fracture. 2. Nonspecific small left mastoid effusion. 3. Generalized cerebral volume loss. Mild chronic small vessel ischemia. 4. Small left basal ganglia lacune, probably chronic. 5. No cervical spine fracture or subluxation. 6. Mild-to-moderate degenerative changes in the cervical spine as described . 7. **An incidental finding of potential clinical significance has been found. Nonspecific 1.4 x 1.0 cm mass at the inferior margin of the right parotid gland, which may represent a mildly enlarged periparotid lymph node. Consider correlation with targeted parotid ultrasound and/or MRI neck without and with IV contrast. ** Electronically Signed   By: Ilona Sorrel M.D.   On: 02/26/2016 18:05   Ct Cervical Spine Wo Contrast  Result Date: 02/26/2016 CLINICAL DATA:  Fall at home with head injury. EXAM: CT HEAD WITHOUT CONTRAST CT CERVICAL SPINE WITHOUT CONTRAST TECHNIQUE:  Chloride 100 (*)    Creatinine, Ser 1.50 (*)    Calcium, Ion 1.04 (*)    All other  components within normal limits  I-STAT CG4 LACTIC ACID, ED - Abnormal; Notable for the following:    Lactic Acid, Venous 2.21 (*)    All other components within normal limits  URINE CULTURE  TSH  GLUCOSE, CAPILLARY  TROPONIN I  HEMOGLOBIN A1C  COMPREHENSIVE METABOLIC PANEL  CBC  I-STAT CG4 LACTIC ACID, ED  TYPE AND SCREEN  ABO/RH    EKG  EKG Interpretation  Date/Time:  Wednesday February 27 2016 09:56:54 EST Ventricular Rate:  136 PR Interval:    QRS Duration: 134 QT Interval:  453 QTC Calculation: 506 R Axis:   63 Text Interpretation:  Sinus tachycardia Right bundle branch block Abnormal T, consider ischemia, lateral leads Baseline wander in lead(s) V2 TW inversions new from prior Confirmed by [facility] MD, Edrees Valent ([id number]) on 03/13/2016 12:36:18 PM       Radiology Ct Head Wo Contrast  Result Date: 02/26/2016 CLINICAL DATA:  Fall at home with head injury. EXAM: CT HEAD WITHOUT CONTRAST CT CERVICAL SPINE WITHOUT CONTRAST TECHNIQUE: Multidetector CT imaging of the head and cervical spine was performed following the standard protocol without intravenous contrast. Multiplanar CT image reconstructions of the cervical spine were also generated. COMPARISON:  None. FINDINGS: CT HEAD FINDINGS Brain: No evidence of parenchymal hemorrhage or extra-axial fluid collection. No mass lesion, mass effect, or midline shift. No CT evidence of acute transcortical infarction. Small left basal ganglia lacune of uncertain chronicity, probably chronic . Generalized cerebral volume loss. Intracranial atherosclerosis. Nonspecific mild subcortical and periventricular white matter hypodensity, most in keeping with chronic small vessel ischemic change. No ventriculomegaly. Vascular: No hyperdense vessel or unexpected calcification. Skull: No evidence of calvarial fracture. Sinuses/Orbits: The visualized paranasal sinuses are essentially clear. Other: The right mastoid air cells are unopacified. Small inferior left  mastoid effusion. CT CERVICAL SPINE FINDINGS Alignment: Normal cervical lordosis. No subluxation. Dens is well positioned between the lateral masses of C1. Skull base and vertebrae: No acute fracture. No primary bone lesion or focal pathologic process. Soft tissues and spinal canal: No prevertebral fluid or swelling. No visible canal hematoma. Disc levels: Mild-to-moderate degenerative disc disease in the mid to lower cervical spine, most prominent at C5-6. Moderate bilateral facet arthropathy. Mild to moderate degenerate foraminal stenosis on the right at C3-4. Upper chest: Negative. Other: Visualized right mastoid air cells appear clear. Nonspecific small inferior left mastoid effusion. No discrete thyroid nodules. 1.4 x 1.0 cm mass at the inferior margin of the right parotid gland (series 8/ image 39). IMPRESSION: 1. No evidence of acute intracranial abnormality. No evidence of calvarial fracture. 2. Nonspecific small left mastoid effusion. 3. Generalized cerebral volume loss. Mild chronic small vessel ischemia. 4. Small left basal ganglia lacune, probably chronic. 5. No cervical spine fracture or subluxation. 6. Mild-to-moderate degenerative changes in the cervical spine as described . 7. **An incidental finding of potential clinical significance has been found. Nonspecific 1.4 x 1.0 cm mass at the inferior margin of the right parotid gland, which may represent a mildly enlarged periparotid lymph node. Consider correlation with targeted parotid ultrasound and/or MRI neck without and with IV contrast. ** Electronically Signed   By: Ilona Sorrel M.D.   On: 02/26/2016 18:05   Ct Cervical Spine Wo Contrast  Result Date: 02/26/2016 CLINICAL DATA:  Fall at home with head injury. EXAM: CT HEAD WITHOUT CONTRAST CT CERVICAL SPINE WITHOUT CONTRAST TECHNIQUE:  Chloride 100 (*)    Creatinine, Ser 1.50 (*)    Calcium, Ion 1.04 (*)    All other  components within normal limits  I-STAT CG4 LACTIC ACID, ED - Abnormal; Notable for the following:    Lactic Acid, Venous 2.21 (*)    All other components within normal limits  URINE CULTURE  TSH  GLUCOSE, CAPILLARY  TROPONIN I  HEMOGLOBIN A1C  COMPREHENSIVE METABOLIC PANEL  CBC  I-STAT CG4 LACTIC ACID, ED  TYPE AND SCREEN  ABO/RH    EKG  EKG Interpretation  Date/Time:  Wednesday February 27 2016 09:56:54 EST Ventricular Rate:  136 PR Interval:    QRS Duration: 134 QT Interval:  453 QTC Calculation: 506 R Axis:   63 Text Interpretation:  Sinus tachycardia Right bundle branch block Abnormal T, consider ischemia, lateral leads Baseline wander in lead(s) V2 TW inversions new from prior Confirmed by [facility] MD, Edrees Valent ([id number]) on 03/13/2016 12:36:18 PM       Radiology Ct Head Wo Contrast  Result Date: 02/26/2016 CLINICAL DATA:  Fall at home with head injury. EXAM: CT HEAD WITHOUT CONTRAST CT CERVICAL SPINE WITHOUT CONTRAST TECHNIQUE: Multidetector CT imaging of the head and cervical spine was performed following the standard protocol without intravenous contrast. Multiplanar CT image reconstructions of the cervical spine were also generated. COMPARISON:  None. FINDINGS: CT HEAD FINDINGS Brain: No evidence of parenchymal hemorrhage or extra-axial fluid collection. No mass lesion, mass effect, or midline shift. No CT evidence of acute transcortical infarction. Small left basal ganglia lacune of uncertain chronicity, probably chronic . Generalized cerebral volume loss. Intracranial atherosclerosis. Nonspecific mild subcortical and periventricular white matter hypodensity, most in keeping with chronic small vessel ischemic change. No ventriculomegaly. Vascular: No hyperdense vessel or unexpected calcification. Skull: No evidence of calvarial fracture. Sinuses/Orbits: The visualized paranasal sinuses are essentially clear. Other: The right mastoid air cells are unopacified. Small inferior left  mastoid effusion. CT CERVICAL SPINE FINDINGS Alignment: Normal cervical lordosis. No subluxation. Dens is well positioned between the lateral masses of C1. Skull base and vertebrae: No acute fracture. No primary bone lesion or focal pathologic process. Soft tissues and spinal canal: No prevertebral fluid or swelling. No visible canal hematoma. Disc levels: Mild-to-moderate degenerative disc disease in the mid to lower cervical spine, most prominent at C5-6. Moderate bilateral facet arthropathy. Mild to moderate degenerate foraminal stenosis on the right at C3-4. Upper chest: Negative. Other: Visualized right mastoid air cells appear clear. Nonspecific small inferior left mastoid effusion. No discrete thyroid nodules. 1.4 x 1.0 cm mass at the inferior margin of the right parotid gland (series 8/ image 39). IMPRESSION: 1. No evidence of acute intracranial abnormality. No evidence of calvarial fracture. 2. Nonspecific small left mastoid effusion. 3. Generalized cerebral volume loss. Mild chronic small vessel ischemia. 4. Small left basal ganglia lacune, probably chronic. 5. No cervical spine fracture or subluxation. 6. Mild-to-moderate degenerative changes in the cervical spine as described . 7. **An incidental finding of potential clinical significance has been found. Nonspecific 1.4 x 1.0 cm mass at the inferior margin of the right parotid gland, which may represent a mildly enlarged periparotid lymph node. Consider correlation with targeted parotid ultrasound and/or MRI neck without and with IV contrast. ** Electronically Signed   By: Ilona Sorrel M.D.   On: 02/26/2016 18:05   Ct Cervical Spine Wo Contrast  Result Date: 02/26/2016 CLINICAL DATA:  Fall at home with head injury. EXAM: CT HEAD WITHOUT CONTRAST CT CERVICAL SPINE WITHOUT CONTRAST TECHNIQUE:

## 2016-02-27 NOTE — Care Management Note (Signed)
Case Management Note  Patient Details  Name: Dianna LimboJimmy A Wolfman MRN: [id number] Date of Birth: [birth year]  Subjective/Objective:                  From home alone. /75 y.o. male.Patient status post fall at home unwitnessed occurred about 2 in the morning. Patient unable to get up.  Action/Plan: Follow for disposition needs. /Admit to INPATIENT; anticipate discharge HOME WITH HH.    Expected Discharge Date:  02/29/16               Expected Discharge Plan:  Home w Home Health Services  In-House Referral:  Clinical Social Work  Discharge planning Services  CM Consult  Post Acute Care Choice:  NA Choice offered to:  NA  DME Arranged:  N/A DME Agency:  NA  HH Arranged:  NA HH Agency:  NA  Status of Service:  In process, will continue to follow  If discussed at Long Length of Stay Meetings, dates discussed:    Additional Comments:  Oletta CohnWood, Pepper Kerrick, RN 03/07/2016, 3:14 PM

## 2016-02-27 NOTE — ED Notes (Signed)
Attempted report x1. 

## 2016-02-27 NOTE — ED Notes (Signed)
Lab to add on troponin  

## 2016-02-27 NOTE — ED Notes (Signed)
Attempted to draw labs w/ IV start; however, unable to do so. Contacted phleb to draw labs

## 2016-02-27 NOTE — ED Notes (Signed)
Admitting at bedside 

## 2016-02-27 NOTE — ED Triage Notes (Signed)
Pt called EMS for "not feeling good." Progressive weakness.  Pt had fall a few days ago- was evaluated at ED and no bleeding. Pt's initial BP 82/50, weak radial pulse per EMS. Pt on xaralto, PPM, AFib. Pt was in Afib for EMS. HR 60. Last BP 112/70. EMS gave 1 liter NS. CBG 119.

## 2016-02-28 ENCOUNTER — Observation Stay (HOSPITAL_COMMUNITY): Payer: Medicare HMO

## 2016-02-28 ENCOUNTER — Encounter (HOSPITAL_COMMUNITY): Payer: Self-pay

## 2016-02-28 DIAGNOSIS — I442 Atrioventricular block, complete: Secondary | ICD-10-CM | POA: Diagnosis not present

## 2016-02-28 DIAGNOSIS — G931 Anoxic brain damage, not elsewhere classified: Secondary | ICD-10-CM | POA: Diagnosis not present

## 2016-02-28 DIAGNOSIS — Y92009 Unspecified place in unspecified non-institutional (private) residence as the place of occurrence of the external cause: Secondary | ICD-10-CM | POA: Diagnosis not present

## 2016-02-28 DIAGNOSIS — R5383 Other fatigue: Secondary | ICD-10-CM | POA: Diagnosis not present

## 2016-02-28 DIAGNOSIS — I472 Ventricular tachycardia: Secondary | ICD-10-CM | POA: Diagnosis not present

## 2016-02-28 DIAGNOSIS — I248 Other forms of acute ischemic heart disease: Secondary | ICD-10-CM | POA: Diagnosis present

## 2016-02-28 DIAGNOSIS — E872 Acidosis: Secondary | ICD-10-CM | POA: Diagnosis present

## 2016-02-28 DIAGNOSIS — I10 Essential (primary) hypertension: Secondary | ICD-10-CM | POA: Diagnosis not present

## 2016-02-28 DIAGNOSIS — N179 Acute kidney failure, unspecified: Secondary | ICD-10-CM | POA: Diagnosis present

## 2016-02-28 DIAGNOSIS — I130 Hypertensive heart and chronic kidney disease with heart failure and stage 1 through stage 4 chronic kidney disease, or unspecified chronic kidney disease: Secondary | ICD-10-CM | POA: Diagnosis present

## 2016-02-28 DIAGNOSIS — I5042 Chronic combined systolic (congestive) and diastolic (congestive) heart failure: Secondary | ICD-10-CM | POA: Diagnosis present

## 2016-02-28 DIAGNOSIS — R778 Other specified abnormalities of plasma proteins: Secondary | ICD-10-CM

## 2016-02-28 DIAGNOSIS — R221 Localized swelling, mass and lump, neck: Secondary | ICD-10-CM

## 2016-02-28 DIAGNOSIS — E119 Type 2 diabetes mellitus without complications: Secondary | ICD-10-CM

## 2016-02-28 DIAGNOSIS — J690 Pneumonitis due to inhalation of food and vomit: Secondary | ICD-10-CM | POA: Diagnosis not present

## 2016-02-28 DIAGNOSIS — R748 Abnormal levels of other serum enzymes: Secondary | ICD-10-CM | POA: Diagnosis not present

## 2016-02-28 DIAGNOSIS — I480 Paroxysmal atrial fibrillation: Secondary | ICD-10-CM | POA: Diagnosis not present

## 2016-02-28 DIAGNOSIS — I313 Pericardial effusion (noninflammatory): Secondary | ICD-10-CM | POA: Diagnosis not present

## 2016-02-28 DIAGNOSIS — R410 Disorientation, unspecified: Secondary | ICD-10-CM | POA: Diagnosis not present

## 2016-02-28 DIAGNOSIS — R799 Abnormal finding of blood chemistry, unspecified: Secondary | ICD-10-CM

## 2016-02-28 DIAGNOSIS — N189 Chronic kidney disease, unspecified: Secondary | ICD-10-CM | POA: Diagnosis present

## 2016-02-28 DIAGNOSIS — Z515 Encounter for palliative care: Secondary | ICD-10-CM | POA: Diagnosis not present

## 2016-02-28 DIAGNOSIS — E1122 Type 2 diabetes mellitus with diabetic chronic kidney disease: Secondary | ICD-10-CM | POA: Diagnosis present

## 2016-02-28 DIAGNOSIS — R55 Syncope and collapse: Secondary | ICD-10-CM

## 2016-02-28 DIAGNOSIS — M79609 Pain in unspecified limb: Secondary | ICD-10-CM | POA: Diagnosis not present

## 2016-02-28 DIAGNOSIS — J9600 Acute respiratory failure, unspecified whether with hypoxia or hypercapnia: Secondary | ICD-10-CM | POA: Diagnosis not present

## 2016-02-28 DIAGNOSIS — Z66 Do not resuscitate: Secondary | ICD-10-CM | POA: Diagnosis not present

## 2016-02-28 DIAGNOSIS — I493 Ventricular premature depolarization: Secondary | ICD-10-CM | POA: Diagnosis present

## 2016-02-28 DIAGNOSIS — Z23 Encounter for immunization: Secondary | ICD-10-CM | POA: Diagnosis not present

## 2016-02-28 DIAGNOSIS — R7989 Other specified abnormal findings of blood chemistry: Secondary | ICD-10-CM

## 2016-02-28 DIAGNOSIS — M6282 Rhabdomyolysis: Secondary | ICD-10-CM | POA: Diagnosis present

## 2016-02-28 DIAGNOSIS — R220 Localized swelling, mass and lump, head: Secondary | ICD-10-CM

## 2016-02-28 DIAGNOSIS — R570 Cardiogenic shock: Secondary | ICD-10-CM | POA: Diagnosis not present

## 2016-02-28 DIAGNOSIS — E11649 Type 2 diabetes mellitus with hypoglycemia without coma: Secondary | ICD-10-CM | POA: Diagnosis present

## 2016-02-28 DIAGNOSIS — I4891 Unspecified atrial fibrillation: Secondary | ICD-10-CM | POA: Diagnosis not present

## 2016-02-28 DIAGNOSIS — I9589 Other hypotension: Secondary | ICD-10-CM | POA: Diagnosis not present

## 2016-02-28 DIAGNOSIS — I70221 Atherosclerosis of native arteries of extremities with rest pain, right leg: Secondary | ICD-10-CM | POA: Diagnosis not present

## 2016-02-28 DIAGNOSIS — M25461 Effusion, right knee: Secondary | ICD-10-CM | POA: Diagnosis present

## 2016-02-28 DIAGNOSIS — I469 Cardiac arrest, cause unspecified: Secondary | ICD-10-CM | POA: Diagnosis not present

## 2016-02-28 DIAGNOSIS — Z950 Presence of cardiac pacemaker: Secondary | ICD-10-CM | POA: Diagnosis not present

## 2016-02-28 DIAGNOSIS — I481 Persistent atrial fibrillation: Secondary | ICD-10-CM | POA: Diagnosis present

## 2016-02-28 DIAGNOSIS — E860 Dehydration: Secondary | ICD-10-CM

## 2016-02-28 DIAGNOSIS — T45516A Underdosing of anticoagulants, initial encounter: Secondary | ICD-10-CM | POA: Diagnosis present

## 2016-02-28 DIAGNOSIS — J9601 Acute respiratory failure with hypoxia: Secondary | ICD-10-CM | POA: Diagnosis not present

## 2016-02-28 DIAGNOSIS — I951 Orthostatic hypotension: Secondary | ICD-10-CM | POA: Diagnosis not present

## 2016-02-28 DIAGNOSIS — L89101 Pressure ulcer of unspecified part of back, stage 1: Secondary | ICD-10-CM | POA: Diagnosis not present

## 2016-02-28 LAB — COMPREHENSIVE METABOLIC PANEL
ALBUMIN: 2.8 g/dL — AB (ref 3.5–5.0)
ALK PHOS: 44 U/L (ref 38–126)
ALT: 17 U/L (ref 17–63)
ANION GAP: 6 (ref 5–15)
AST: 46 U/L — ABNORMAL HIGH (ref 15–41)
BILIRUBIN TOTAL: 1.3 mg/dL — AB (ref 0.3–1.2)
BUN: 16 mg/dL (ref 6–20)
CO2: 22 mmol/L (ref 22–32)
CREATININE: 1.32 mg/dL — AB (ref 0.61–1.24)
Calcium: 7.6 mg/dL — ABNORMAL LOW (ref 8.9–10.3)
Chloride: 108 mmol/L (ref 101–111)
GFR calc Af Amer: 59 mL/min — ABNORMAL LOW (ref >60)
GFR calc non Af Amer: 51 mL/min — ABNORMAL LOW (ref >60)
GLUCOSE: 77 mg/dL (ref 65–99)
Potassium: 3.6 mmol/L (ref 3.5–5.1)
SODIUM: 136 mmol/L (ref 135–145)
TOTAL PROTEIN: 5 g/dL — AB (ref 6.5–8.1)

## 2016-02-28 LAB — GLUCOSE, CAPILLARY
GLUCOSE-CAPILLARY: 94 mg/dL (ref 65–99)
GLUCOSE-CAPILLARY: 139 mg/dL — AB (ref 65–99)
GLUCOSE-CAPILLARY: 95 mg/dL (ref 65–99)
Glucose-Capillary: 84 mg/dL (ref 65–99)
Glucose-Capillary: 126 mg/dL — ABNORMAL HIGH (ref 65–99)

## 2016-02-28 LAB — URINALYSIS, ROUTINE W REFLEX MICROSCOPIC
BILIRUBIN URINE: NEGATIVE
Glucose, UA: 50 mg/dL — AB
KETONES UR: NEGATIVE mg/dL
NITRITE: NEGATIVE
PROTEIN: NEGATIVE mg/dL
SQUAMOUS EPITHELIAL / LPF: NONE SEEN
Specific Gravity, Urine: 1.012 (ref 1.005–1.030)
pH: 5 (ref 5.0–8.0)

## 2016-02-28 LAB — URINE CULTURE: CULTURE: NO GROWTH

## 2016-02-28 LAB — CBC
HCT: 38.3 % — ABNORMAL LOW (ref 39.0–52.0)
HEMOGLOBIN: 12.8 g/dL — AB (ref 13.0–17.0)
MCH: 30.9 pg (ref 26.0–34.0)
MCHC: 33.4 g/dL (ref 30.0–36.0)
MCV: 92.5 fL (ref 78.0–100.0)
PLATELETS: 177 10*3/uL (ref 150–400)
RBC: 4.14 MIL/uL — ABNORMAL LOW (ref 4.22–5.81)
RDW: 14.8 % (ref 11.5–15.5)
WBC: 8.8 10*3/uL (ref 4.0–10.5)

## 2016-02-28 LAB — HEMOGLOBIN A1C
Hgb A1c MFr Bld: 5.4 % (ref 4.8–5.6)
Mean Plasma Glucose: 108 mg/dL

## 2016-02-28 LAB — CK: Total CK: 996 U/L — ABNORMAL HIGH (ref 49–397)

## 2016-02-28 MED ORDER — ASPIRIN 325 MG PO TABS
325.0000 mg | ORAL_TABLET | Freq: Every day | ORAL | Status: DC
  Administered 2016-02-28: 325 mg via ORAL
  Filled 2016-02-28: qty 1

## 2016-02-28 MED ORDER — SODIUM CHLORIDE 0.9 % IV SOLN
INTRAVENOUS | Status: AC
  Administered 2016-02-28 (×2): via INTRAVENOUS

## 2016-02-28 MED ORDER — DICLOFENAC SODIUM 1 % TD GEL
2.0000 g | Freq: Four times a day (QID) | TRANSDERMAL | Status: DC
  Administered 2016-02-28 – 2016-03-04 (×20): 2 g via TOPICAL
  Filled 2016-02-28 (×2): qty 100

## 2016-02-28 NOTE — Evaluation (Signed)
Physical Therapy Evaluation Patient Details Name: Darin Gonzalez MRN: [id number] DOB: 1940/11/21 Today's Date: 02/28/2016   History of Present Illness  Pt adm after fall at home. Pt found to be hypotensive and dehydrated. PMH - DM, HTN, afib, pacer  Clinical Impression  Pt admitted with above diagnosis and presents to PT with functional limitations due to deficits listed below (See PT problem list). Pt needs skilled PT to maximize independence and safety to allow discharge to home. Pt agreeable to use of walker at home to improve stability.      Follow Up Recommendations Home health PT;Supervision - Intermittent    Equipment Recommendations  Rolling walker with 5" wheels    Recommendations for Other Services       Precautions / Restrictions Precautions Precautions: Fall Restrictions Weight Bearing Restrictions: No      Mobility  Bed Mobility               General bed mobility comments: Pt sitting EOB  Transfers Overall transfer level: Needs assistance Equipment used: Rolling walker (2 wheeled) Transfers: Sit to/from Stand Sit to Stand: Min assist;Supervision         General transfer comment: Initially pt min A to bring hips up. After several practices pt improved to supervision. Initial verbal cues for hand placement  Ambulation/Gait Ambulation/Gait assistance: Min guard;Supervision Ambulation Distance (Feet): 150 Feet Assistive device: Rolling walker (2 wheeled) Gait Pattern/deviations: Step-through pattern;Decreased stride length Gait velocity: decr Gait velocity interpretation: Below normal speed for age/gender General Gait Details: With walker pt with steady gait. Reports subjective weakness but no loss of balance or instability  Stairs            Wheelchair Mobility    Modified Rankin (Stroke Patients Only)       Balance Overall balance assessment: Needs assistance Sitting-balance support: No upper extremity supported;Feet  supported Sitting balance-Leahy Scale: Good     Standing balance support: No upper extremity supported Standing balance-Leahy Scale: Fair                               Pertinent Vitals/Pain Pain Assessment: Faces Faces Pain Scale: Hurts little more Pain Location: hips Pain Descriptors / Indicators: Guarding;Grimacing Pain Intervention(s): Limited activity within patient's tolerance    Home Living Family/patient expects to be discharged to:: Private residence Living Arrangements: Alone Available Help at Discharge: Family;Available PRN/intermittently Type of Home: House Home Access: Stairs to enter Entrance Stairs-Rails: Right Entrance Stairs-Number of Steps: 3-4 Home Layout: One level Home Equipment:  (walking stick)      Prior Function Level of Independence: Independent               Hand Dominance        Extremity/Trunk Assessment   Upper Extremity Assessment Upper Extremity Assessment: Defer to OT evaluation    Lower Extremity Assessment Lower Extremity Assessment: Generalized weakness       Communication   Communication: No difficulties  Cognition Arousal/Alertness: Awake/alert Behavior During Therapy: WFL for tasks assessed/performed;Anxious Overall Cognitive Status: History of cognitive impairments - at baseline Area of Impairment: Memory     Memory: Decreased short-term memory         General Comments: son reports increasing forgetfulness    General Comments      Exercises     Assessment/Plan    PT Assessment Patient needs continued PT services  PT Problem List Decreased strength;Decreased balance;Decreased mobility;Decreased knowledge of use of DME  PT Treatment Interventions DME instruction;Functional mobility training;Therapeutic activities;Therapeutic exercise;Balance training;Patient/family education;Stair training;Gait training    PT Goals (Current goals can be found in the Care Plan section)  Acute  Rehab PT Goals Patient Stated Goal: return home PT Goal Formulation: With patient/family Time For Goal Achievement: 03/06/16 Potential to Achieve Goals: Good    Frequency Min 3X/week   Barriers to discharge Inaccessible home environment;Decreased caregiver support Lives alone and has stairs to enter    Co-evaluation               End of Session Equipment Utilized During Treatment: Gait belt Activity Tolerance: Patient tolerated treatment well Patient left: in chair;with call bell/phone within reach;with chair alarm set;with family/visitor present Nurse Communication: Mobility status    Functional Assessment Tool Used: clinical judgement Functional Limitation: Mobility: Walking and moving around Mobility: Walking and Moving Around Current Status [phone]): At least 20 percent but less than 40 percent impaired, limited or restricted Mobility: Walking and Moving Around Goal Status [phone]): At least 1 percent but less than 20 percent impaired, limited or restricted    Time: 1017-1035 PT Time Calculation (min) (ACUTE ONLY): 18 min   Charges:   PT Evaluation $PT Eval Moderate Complexity: 1 Procedure     PT G Codes:   PT G-Codes **NOT FOR INPATIENT CLASS** Functional Assessment Tool Used: clinical judgement Functional Limitation: Mobility: Walking and moving around Mobility: Walking and Moving Around Current Status ([id number]): At least 20 percent but less than 40 percent impaired, limited or restricted Mobility: Walking and Moving Around Goal Status [phone]): At least 1 percent but less than 20 percent impaired, limited or restricted    Angelina Ok [facility] 02/28/2016, 10:49 AM Skip Mayer PT [phone]

## 2016-02-28 NOTE — Progress Notes (Signed)
Subjective: [name] is frustrated with persistent pain in his hips and elbows today and reports that he could not get any medication for pain since last night. Denies muscle aches, proximal legs still weak. His mild hypotension persists (SBP 90s-110s this AM) but energy seems somewhat improved. He is sitting up in bed talking with family and making jokes this morning.   Objective: Vital signs in last 24 hours: Vitals:   02/28/16 0016 02/28/16 0202 02/28/16 0449 02/28/16 0738  BP: (!) 80/60 92/65 (!) 115/47 (!) 94/55  Pulse: 70 89 77 75  Resp: 18  18 18   Temp: 97.7 F (36.5 C)  97.6 F (36.4 C) 97.5 F (36.4 C)  TempSrc: Oral  Oral Oral  SpO2: 96%  99% 98%  Weight:   97.7 kg (215 lb 4.8 oz)   Height:        Intake/Output Summary (Last 24 hours) at 02/28/16 1419 Last data filed at 02/28/16 1318  Gross per 24 hour  Intake          1698.75 ml  Output              250 ml  Net          1448.75 ml    Physical Exam Physical Exam  Constitutional: He is oriented to person, place, and time. He appears well-developed. No distress.  HENT:  Head: Normocephalic and atraumatic.  Mouth/Throat: Oropharynx is clear and moist.  Eyes: Conjunctivae are normal. Pupils are equal, round, and reactive to light.  Neck: Normal range of motion. Neck supple.  Cardiovascular: Normal rate and normal heart sounds.  An irregularly irregular rhythm present.  No murmur heard. Pulmonary/Chest: Effort normal and breath sounds normal. No respiratory distress.  Abdominal: Soft. He exhibits no distension. There is no tenderness.  Musculoskeletal: Normal range of motion. He exhibits deformity (left index finger missing). He exhibits no edema.  Lymphadenopathy:    He has cervical adenopathy (palpable right neck lump).  Neurological: He is alert and oriented to person, place, and time.  Proximal BLE weakness 4/5, otherwise 5/5 throughout  Skin: Skin is warm and dry. Capillary refill takes less than 2  seconds.  Hyperpigmentation over lower legs  Psychiatric: He has a normal mood and affect.  Vitals reviewed.  Labs / Imaging / Procedures: CBC Latest Ref Rng & Units 02/28/2016 03/21/2016 03/05/2016  WBC 4.0 - 10.5 K/uL 8.8 - 11.4(H)  Hemoglobin 13.0 - 17.0 g/dL 12.8(L) 13.6 13.9  Hematocrit 39.0 - 52.0 % 38.3(L) 40.0 41.2  Platelets 150 - 400 K/uL 177 - 200   BMP Latest Ref Rng & Units 02/28/2016 03/21/2016 03/08/2016  Glucose 65 - 99 mg/dL 77 90 89  BUN 6 - 20 mg/dL 16 20 16   Creatinine 0.61 - 1.24 mg/dL 1.61(W1.32(H) 9.60(A1.50(H) 5.40(J1.66(H)  Sodium 135 - 145 mmol/L 136 135 136  Potassium 3.5 - 5.1 mmol/L 3.6 4.1 4.1  Chloride 101 - 111 mmol/L 108 100(L) 104  CO2 22 - 32 mmol/L 22 - 20(L)  Calcium 8.9 - 10.3 mg/dL 7.6(L) - 8.2(L)   No results found.  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  Fall, vs syncope - unclear if mechanical fall at home, coincided with profound weakness and stuck on floor for 12 hours, having persistent weakness and hypotension, also with poor po intake. History of CHB with pacemaker and PAF,  on Xarelto - CT head, c-spine, hip XR with no acute abnml/fracture - Cardiology to interrogate pacemaker - TTE - Telemetry - IV fluids NS at 125 cc/hour - PT OT eval/treat - recommending HH PTOT and intermittent supervision - Tylenol PRN for aches/pains - Voltaren gel PRN for aches/pains  Elevated troponins, peaked at 0.31 on admission, trended down to 0.19, EKG with new TWI in I, aVR, aVL, V2-V6 per cardiology. He did have elevated CK, likely mild rhabdomyolysis following fall/prolonged down time. High risk patient with DM, HTN, HLD, former smoker, family history CAD. - Given Aspirin 325 mg - Follow cardiology recommendations w/r/t ischemic workup - TTE   Hypotension - persisting this morning SBP 90-100s / 40-50s, decreased fluid and po intake  over the past 2 days, mild AKI on admission, now s/p multiple IV fluid boluses in the ED. Orthostatics negative on admission - Continue maintenance IV fluids  PAF and CHB s/p pacemaker, in AF with normal rates, some intermittent tachyrhythmia on telemetry. On Xarelto but no rate control medication at home. Cardiologist is Dr. Ladona Ridgel but overdue for follow up visit, has been rescheduling appointments. - Metoprolol 12.5 mg BID started for rate control - Xarelto 20 mg daily - Telemetry  Neck mass, adjacent to right parotid gland, incidental finding on CT imaging, longstanding chewing tobacco use, remote smoking history, palpable on exam - Follow head and neck ultrasound   Hematuria, noted on admission U/A, 6-30 RBCs, in setting of mild rhabdo with elevated CK to 1019. - Repeat UA this evening  AKI, mild, resolving, creatinine 1.50 on admission - improving with continued IV fluids  T2DM, HbA1c 5.4, normal glucose on repeated checks this admission - Dc SSI - CBG Q shift  GERD - continue home Protonix HLD - continue home Pravachol  Dispo: Anticipated discharge in approximately 1-2 days.  LOS: 0 days   Althia Forts, MD 02/28/2016, 2:19 PM Pager: [phone]

## 2016-02-28 NOTE — Progress Notes (Signed)
Subjective:  Darin Gonzalez reports no changes or acute events overnight.  He continues to deny any chest pain, palpitations, SOB.    Related to the incidental neck mass found on CT, his daughter relayed that he uses around 3 packs of chewing tobacco / day and has had a ~50 lb. wt loss over the past few months.  However, she also notes a greatly decreased appetite over this time period.  Objective: Vital signs in last 24 hours: Vitals:   02/28/16 0016 02/28/16 0202 02/28/16 0449 02/28/16 0738  BP: (!) 80/60 92/65 (!) 115/47 (!) 94/55  Pulse: 70 89 77 75  Resp: 18  18 18   Temp: 97.7 F (36.5 C)  97.6 F (36.4 C) 97.5 F (36.4 C)  TempSrc: Oral  Oral Oral  SpO2: 96%  99% 98%  Weight:   97.7 kg (215 lb 4.8 oz)   Height:       Weight change:   Intake/Output Summary (Last 24 hours) at 02/28/16 1149 Last data filed at 02/28/16 0840  Gross per 24 hour  Intake          3458.75 ml  Output              250 ml  Net          3208.75 ml   Physical Exam Gen: Well-appearing in NAD Neuro: 5/5 strength in UE and 3/5 in LE HEENT: NCAT. PEERLA. Moist mucous membranes. Small painful, mobile mass appreciated in right neck.  CV: Distant heart sounds. Pulm:  CTAB, no wheezes or rhonchi. Abdomen: Soft, non-tender to palpation in four quadrants.  Normal BS appreciated.  Extremities: L index finger amputated and ecchymosis on L extensor surface of forearm.  No LE edema and DP intact.   Lab Results:  UA with 6-30 RBC/hpf  Troponin 0.31>0.23>0.19 S Cr 1.66 > 1.32  Micro Results: Recent Results (from the past 240 hour(s))  Urine culture     Status: None   Collection Time: Mar 09, 2016 12:30 PM  Result Value Ref Range Status   Specimen Description URINE, RANDOM  Final   Special Requests NONE  Final   Culture NO GROWTH  Final   Report Status 02/28/2016 FINAL  Final   Studies/Results: Ct Head Wo Contrast  Result Date: 02/26/2016 CLINICAL DATA:  Fall at home with head injury. EXAM: CT HEAD  WITHOUT CONTRAST CT CERVICAL SPINE WITHOUT CONTRAST TECHNIQUE: Multidetector CT imaging of the head and cervical spine was performed following the standard protocol without intravenous contrast. Multiplanar CT image reconstructions of the cervical spine were also generated. COMPARISON:  None. FINDINGS: CT HEAD FINDINGS Brain: No evidence of parenchymal hemorrhage or extra-axial fluid collection. No mass lesion, mass effect, or midline shift. No CT evidence of acute transcortical infarction. Small left basal ganglia lacune of uncertain chronicity, probably chronic . Generalized cerebral volume loss. Intracranial atherosclerosis. Nonspecific mild subcortical and periventricular white matter hypodensity, most in keeping with chronic small vessel ischemic change. No ventriculomegaly. Vascular: No hyperdense vessel or unexpected calcification. Skull: No evidence of calvarial fracture. Sinuses/Orbits: The visualized paranasal sinuses are essentially clear. Other: The right mastoid air cells are unopacified. Small inferior left mastoid effusion. CT CERVICAL SPINE FINDINGS Alignment: Normal cervical lordosis. No subluxation. Dens is well positioned between the lateral masses of C1. Skull base and vertebrae: No acute fracture. No primary bone lesion or focal pathologic process. Soft tissues and spinal canal: No prevertebral fluid or swelling. No visible canal hematoma. Disc levels: Mild-to-moderate degenerative disc disease in the  mid to lower cervical spine, most prominent at C5-6. Moderate bilateral facet arthropathy. Mild to moderate degenerate foraminal stenosis on the right at C3-4. Upper chest: Negative. Other: Visualized right mastoid air cells appear clear. Nonspecific small inferior left mastoid effusion. No discrete thyroid nodules. 1.4 x 1.0 cm mass at the inferior margin of the right parotid gland (series 8/ image 39). IMPRESSION: 1. No evidence of acute intracranial abnormality. No evidence of calvarial  fracture. 2. Nonspecific small left mastoid effusion. 3. Generalized cerebral volume loss. Mild chronic small vessel ischemia. 4. Small left basal ganglia lacune, probably chronic. 5. No cervical spine fracture or subluxation. 6. Mild-to-moderate degenerative changes in the cervical spine as described . 7. **An incidental finding of potential clinical significance has been found. Nonspecific 1.4 x 1.0 cm mass at the inferior margin of the right parotid gland, which may represent a mildly enlarged periparotid lymph node. Consider correlation with targeted parotid ultrasound and/or MRI neck without and with IV contrast. ** Electronically Signed   By: Delbert PhenixJason A Poff M.D.   On: 02/26/2016 18:05   Ct Cervical Spine Wo Contrast  Result Date: 02/26/2016 CLINICAL DATA:  Fall at home with head injury. EXAM: CT HEAD WITHOUT CONTRAST CT CERVICAL SPINE WITHOUT CONTRAST TECHNIQUE: Multidetector CT imaging of the head and cervical spine was performed following the standard protocol without intravenous contrast. Multiplanar CT image reconstructions of the cervical spine were also generated. COMPARISON:  None. FINDINGS: CT HEAD FINDINGS Brain: No evidence of parenchymal hemorrhage or extra-axial fluid collection. No mass lesion, mass effect, or midline shift. No CT evidence of acute transcortical infarction. Small left basal ganglia lacune of uncertain chronicity, probably chronic . Generalized cerebral volume loss. Intracranial atherosclerosis. Nonspecific mild subcortical and periventricular white matter hypodensity, most in keeping with chronic small vessel ischemic change. No ventriculomegaly. Vascular: No hyperdense vessel or unexpected calcification. Skull: No evidence of calvarial fracture. Sinuses/Orbits: The visualized paranasal sinuses are essentially clear. Other: The right mastoid air cells are unopacified. Small inferior left mastoid effusion. CT CERVICAL SPINE FINDINGS Alignment: Normal cervical lordosis. No  subluxation. Dens is well positioned between the lateral masses of C1. Skull base and vertebrae: No acute fracture. No primary bone lesion or focal pathologic process. Soft tissues and spinal canal: No prevertebral fluid or swelling. No visible canal hematoma. Disc levels: Mild-to-moderate degenerative disc disease in the mid to lower cervical spine, most prominent at C5-6. Moderate bilateral facet arthropathy. Mild to moderate degenerate foraminal stenosis on the right at C3-4. Upper chest: Negative. Other: Visualized right mastoid air cells appear clear. Nonspecific small inferior left mastoid effusion. No discrete thyroid nodules. 1.4 x 1.0 cm mass at the inferior margin of the right parotid gland (series 8/ image 39). IMPRESSION: 1. No evidence of acute intracranial abnormality. No evidence of calvarial fracture. 2. Nonspecific small left mastoid effusion. 3. Generalized cerebral volume loss. Mild chronic small vessel ischemia. 4. Small left basal ganglia lacune, probably chronic. 5. No cervical spine fracture or subluxation. 6. Mild-to-moderate degenerative changes in the cervical spine as described . 7. **An incidental finding of potential clinical significance has been found. Nonspecific 1.4 x 1.0 cm mass at the inferior margin of the right parotid gland, which may represent a mildly enlarged periparotid lymph node. Consider correlation with targeted parotid ultrasound and/or MRI neck without and with IV contrast. ** Electronically Signed   By: Delbert PhenixJason A Poff M.D.   On: 02/26/2016 18:05   Dg Chest Portable 1 View  Result Date: 03/25/2016 CLINICAL DATA:  Hypotension.  Fall yesterday.  Weakness. EXAM: PORTABLE CHEST 1 VIEW COMPARISON:  05/01/2015 chest radiograph. FINDINGS: Stable configuration of 2 lead left subclavian pacemaker. Stable cardiomediastinal silhouette with normal heart size and aortic atherosclerosis. No pneumothorax. No pleural effusion. Lungs appear clear, with no acute consolidative airspace  disease and no pulmonary edema. IMPRESSION: 1. No active disease in the chest . 2. Aortic atherosclerosis. Electronically Signed   By: Delbert Phenix M.D.   On: 03/06/2016 10:44   Dg Hips Bilat W Or Wo Pelvis 2 Views  Result Date: 02/26/2016 CLINICAL DATA:  Fall today.  Bilateral hip pain. EXAM: DG HIP (WITH OR WITHOUT PELVIS) 2V BILAT COMPARISON:  12/27/2008 CT abdomen/pelvis . FINDINGS: No pelvic fracture or diastasis. No hip fracture or dislocation. 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 Spondylosis in the visualized lower lumbar spine. No suspicious focal osseous lesions. No radiopaque foreign body. IMPRESSION: No fracture. Electronically Signed   By: Delbert Phenix M.D.   On: 02/26/2016 18:16   Medications: I have reviewed the patient's current medications. Scheduled Meds: . aspirin  325 mg Oral Daily  . feeding supplement (ENSURE ENLIVE)  237 mL Oral BID BM  . fluticasone  2 spray Each Nare Daily  . insulin aspart  0-9 Units Subcutaneous TID WC  . metoprolol tartrate  12.5 mg Oral BID  . pantoprazole  40 mg Oral Daily  . pravastatin  40 mg Oral Daily  . rivaroxaban  20 mg Oral Q supper  . senna  1 tablet Oral BID  . sodium chloride flush  3 mL Intravenous Q12H   Continuous Infusions: PRN Meds:.acetaminophen **OR** acetaminophen, promethazine Assessment/Plan:  Darin Gonzalez is 76 yo M with A-fib with PPM, DM, HTN who has findings concerning for recent MI.  While hypotensive, he does not have a white count, fever, or any infectious symptoms, so less concerned for sepsis at this time.  Active Problems:   Dehydration  Hypotension and dehydration, with earlier presentation for fall: Bp improved, but still somewhat lower than baseline at ~100/60.  Negative testing for orthostatic hypotension -continue MIVF of NS 125 mL/hr -consulted EP for pacemaker interrogation  - continue PT and OT eval - consult dietician -continue ensure feeding  supplementation  Elevated troponins: 0.31-->0.23-->0.19. Likely demand ischemia/ afib precipitation Pt has no chest pain. EKGs showing TWI  - ASA -consulted cardiology for evaluation of possible MI with fall -Echo for EF and WMA  Afib: Pt is on Xarelto and newly added low dose metoprolol.  CHAD2VASC score of at least 4. -As no evidence of bleeding and Hgb as baseline, continue xarelto -metoprolol 12.5 mg BID  Incidental right parotid gland mass-incidentally found on CT - Head and neck U/S - recommend outpatient followup if warranted  GERD: - continue protonix   DM: Last A1c in March 2017 being 6.8. Pt on metformin. -SSI-S TID WC  HTN: BP normotensive -started metoprolol for rate control  Elevated CK / Hematuria- Had fall 2 days ago where he was on the ground for 12 hrs.  CK at 996.  6-30 RBC/hpf on UA yesterday - Level not entirely consistent with rhabdo, however, given moderate Hgb on urine dipstick with check for findings on repeat UA and trend CK.    This is a Psychologist, occupational Note.  The care of the patient was discussed with Dr. Laural Benes and the assessment and plan formulated with their assistance.  Please see their attached note for official documentation of the daily encounter.   LOS: 0  days   Darin Gonzalez, Medical Student 02/28/2016, 11:49 AM

## 2016-02-28 NOTE — Progress Notes (Signed)
Date: 02/28/2016  Patient name: TODRICK Gonzalez  Medical record number: [id number]  Date of birth: 01/28/1941   I have seen and evaluated Darin Gonzalez. Briefly, Darin Gonzalez is a 76yo man with PMH of DM2, HTN, HLD, pAFIB, h/o complete heart block with pacemaker placed in March of this year who presented with weakness.  On the day prior Darin admission, he presented Darin the ED for a fall.  He is very clear that he slipped in the bathroom and fell.  Apparently, afterwards, he was so weak that he couldn'Darin get up and was on the floor for 12 hours.  He was able Darin call EMS and was evaluated in the ED on 02/26/16.  He had a CT of his head which showed no acute bleed.  He had Hip xrays which did not show a fracture.  He was discharged home.    On the day of admission (03/17/2016) Darin Gonzalez called EMS because he did not feel well.  He was feeling weaker, had decreased PO intake.  His main issue was continued hip pain and weakness.  He denied chest pain, fever, chills, headaches, dysuria, shoulder pain, palpitations.  He was found Darin be hypotensive in the ED and he received about 3L of fluids with some improvement.    Blood work showed a lactic acid of 2.21 (mildly elevated), Troponin of 0.31 which has trended down, elevated Cr and an elevated CK (< 1500, myocardial?)  PMHx, Fam Hx, and/or Soc Hx : He has a family history of heart disease in mother and father.  He is a former smoker.  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  Neck supple CV: Irreg Irreg, distant heart sounds, no murmur Pulm: CTAB, no  wheezing Abd: Soft, +BS Ext: No edema, smooth shiny skin on LE.  Skin: Dark discoloration Darin skin on the shins, no wounds.   CT head and neck  02/26/16 IMPRESSION: 1. No evidence of acute intracranial abnormality. No evidence of calvarial fracture. 2. Nonspecific small left mastoid effusion. 3. Generalized cerebral volume loss. Mild chronic small vessel ischemia. 4. Small left basal ganglia lacune, probably chronic. 5. No cervical spine fracture or subluxation. 6. Mild-Darin-moderate degenerative changes in the cervical spine as described . 7. **An incidental finding of potential clinical significance has been found. Nonspecific 1.4 x 1.0 cm mass at the inferior margin of the right parotid gland, which may represent a mildly enlarged periparotid lymph node. Consider correlation with targeted parotid ultrasound and/or MRI neck without and with IV contrast. ** 3 EKG: Afib, a few paced beats, ? ST elevation in lead III.  TWI in the inferior leads.   Assessment and Plan: I have seen and evaluated the patient as outlined above. I agree with the formulated Assessment and Plan as detailed in the residents' note, with the following changes:   1. Hypotension, lactic acidosis - Improved with IVF, but not back Darin baseline.  He was also started on metoprolol for tachycardia - Possible dehydration and low PO intake - continue IVF at  100cc/hr Darin support blood pressure - Check TSH - Further evaluation as noted below - UC no growth.  He has no signs or symptoms reported for infection.  His WBC is normal.  Lactic acid improved with fluids - Cardiology Darin evaluate pacemaker.  He has not seen them in the outpatient setting since having PCM placed in March.    2. S/P fall, ? Syncope - Patient is pretty clear that he fell, no syncope noted.  - TTE planned - Cardiology consulted  3. AKI on CKD - Improved with IVF back Darin baseline around 1.3 - Monitor  4. Elevated Troponin/CK - Given hypotension,  elevated troponin, weakness, considering ischemic evaluation and Cardiology was consulted.  He has not had any chest pain, so would not be typical - Trend CK and Troponin - Start aspirin  5. Afib - Tachycardic, so metoprolol started at low dose - Halve dose if BP continues Darin be soft - Continue xarelto  6. Incidental right parotid mass - Family reports 50# weight loss, being cold all the time and chewing tobacco history.  He denies dysphagia, change in voice, night sweats - Parotid ultrasound today  7. DM2 - A1C - SSI  Inez CatalinaEmily B Mullen, MD 1/4/201812:16 PM

## 2016-02-28 NOTE — Progress Notes (Signed)
Initial Nutrition Assessment  DOCUMENTATION CODES:   Obesity unspecified  INTERVENTION:   -Continue Ensure Enlive po BID, each supplement provides 350 kcal and 20 grams of protein  NUTRITION DIAGNOSIS:   Inadequate oral intake related to poor appetite as evidenced by per patient/family report, meal completion < 25%.  GOAL:   Patient will meet greater than or equal to 90% of their needs  MONITOR:   PO intake, Supplement acceptance, Labs, Weight trends, Skin, I & O's  REASON FOR ASSESSMENT:   Malnutrition Screening Tool    ASSESSMENT:   76 yo man with history of DM, HTN, HLD, pAFib, history of complete heart block, now with a pacemaker by Dr Ladona Ridgelaylor on April 30 2015  who presents back to the ER for dehydration and hypotension He came to the ER yesterday after a fall at 2 AM, and he was down for about 12 hours, later came to the ER, and at that time CT head was negative, and Hip xrays were neg for any fracture.  Pt admitted with hypotension and dehydration s/p fall.   Spoke with pt at bedside, who reports a poor appetite over the past 3-4 months related to weakness ("I think that's why I fell"). PTA, pt reports very minimal food intake due to lack of desire to prepare his own food- at baseline pt prepares all of his own meals, but has more recently been relying on ready made foods. Per pt, he has been eating mainly one bag of cheese doodles daily over the past 2-3 weeks. He noted he has experienced episodes of hypoglycemia secondary to poor oral intake.   Pt endorses weight loss over the past 2-3 months, and noted that his family has noted changes in his appearance. Per wt hx, pt UBW is around 250#. Noted pt has experienced a 13.6% wt loss over the past year.   Meal completion 25%. Pt shares that he has made efforts to transition to more healthful eating habits, but has the tendency to revert back to fried chicken and other high fat foods ("its what I grew up on"). Pt denies eating  much breakfast this morning; reviewed menu with pt and identified items that he enjoyed that were within his diet restrictions. Pt also consumed Ensure at time of visit, which he enjoyed. Discussed importance of good meal and supplement intake to prevent further weight loss and hypoglycemic episodes.   Nutrition-Focused physical exam completed. Findings are mild fat depletion, no muscle depletion, and mild edema.   Labs reviewed: CBGS: 84-126.  Diet Order:  Diet heart healthy/carb modified Room service appropriate? Yes; Fluid consistency: Thin  Skin:  Reviewed, no issues  Last BM:  02/27/15  Height:   Ht Readings from Last 1 Encounters:  2016/08/20 5\' 8"  (1.727 m)    Weight:   Wt Readings from Last 1 Encounters:  02/28/16 215 lb 4.8 oz (97.7 kg)    Ideal Body Weight:  70 kg  BMI:  Body mass index is 32.74 kg/m.  Estimated Nutritional Needs:   Kcal:  1700-1900  Protein:  85-100 grams  Fluid:  1.7-1.9 L  EDUCATION NEEDS:   Education needs addressed  Darin Gonzalez, RD, LDN, CDE Pager: [phone] After hours Pager: [phone]

## 2016-02-28 NOTE — Consult Note (Addendum)
Cardiology Consult    Patient ID: Darin Gonzalez MRN: [id number], DOB/AGE: Feb 01, 1941   Admit date: 03/16/2016 Date of Consult: 02/28/2016  Primary Physician: Pcp Not In System Primary Cardiologist: Dr. Ladona Ridgel Requesting Provider: Dr. Criselda Peaches Reason for Consultation: Elevated Trop  Patient Profile    76 yo male with PMH of CHB s/p St Jude dual chamber, GERD, HTN, HLD, NIDDM who presented to the ED after a fall with hypotension, dehydration and weakness.   Past Medical History   Past Medical History:  Diagnosis Date  . Arthritis    "back" (03/14/2016)  . Fall at home 02/26/2016   striking the back of his head  . GERD (gastroesophageal reflux disease)   . Hypercholesteremia   . Hypertension   . Paroxysmal atrial fibrillation with rapid ventricular response (HCC) 2010   in the setting of abdominal abscess  . Presence of permanent cardiac pacemaker   . Type II diabetes mellitus (HCC)     Past Surgical History:  Procedure Laterality Date  . Abdominal percutaneous abscess drain  2010   abscess from ruptured appendix  . APPENDECTOMY  2010  . EP IMPLANTABLE DEVICE N/A 04/30/2015   Procedure: Pacemaker Implant;  Surgeon: Marinus Maw, MD;  Location: [facility] INVASIVE CV LAB;  Service: Cardiovascular;  Laterality: N/A;  . FINGER AMPUTATION    . HEMORRHOID BANDING    . TONSILLECTOMY  1947     Allergies  Allergies  Allergen Reactions  . Sulfonamide Derivatives Shortness Of Breath  . Codeine     "crazy"    History of Present Illness    Darin Gonzalez is a 76 yo male with PMH of CHB s/p St Jude dual chamber, PAF, GERD, HTN, HLD, NIDDM. He is followed by Dr. Ladona Ridgel and underwent a Dual chamber PPM placement in 3/17 for CHB/symptomatic bradycardia. Reports he did not follow up in the office afterwards and cancelled his appts.   Echo from 12/18/08 showed normal EF of 55-60%.   Per family report patient has been experiencing a decline over the past couple of months. States he has been  admitted at least 2 other times for questionable mechanical falls. Family also reports concerned with his overall cognitive status, stating that he seems to be more forgetful than before. Patient states on the morning of 02/26/16 he got up around 2 am to go to the bathroom. He thinks he may have either spilled some water on the floor, or tripped on a large area around which caused him to fall backwards. States he remembers striking his head and elbows on the floor, but denies any dizziness, palpitations, lightheadedness, chest pain or dyspnea prior to this fall. Also notes he has had increased weakness in his legs over the past couple of months. States he is unable to get off the floor, or call for help. He remained on the floor until 2 PM, when a family member came back to check on him. States he has had decreased by mouth intake over the past couple of days. He was seen in the ER at that time, and discharged home. He then presented back to the ED yesterday morning with generalized complaints, and progressive weakness. He was noted to be hypotensive with systolic blood pressure of 82, and weak radial pulse on EMS arrival.   In the ED his labs showed CK total 1019, troponin 0.31, stable electrolytes, creatinine 1.6, hemoglobin 13.9, lactic acid 2.2. Chest x-ray was negative. C/o bilateral hip pain, but negative fracture on xray. CT  of head and neck was negative for fracture or hemorrhage. EKG on admission showed AF with new TWI in leads I, aVR, aVL, v2-v6. He was given boluses of IV fluids and his blood pressure improved. Also noted to be orthostatic when checked in the ED.   Inpatient Medications    . aspirin  325 mg Oral Daily  . feeding supplement (ENSURE ENLIVE)  237 mL Oral BID BM  . fluticasone  2 spray Each Nare Daily  . Influenza vac split quadrivalent PF  0.5 mL Intramuscular Tomorrow-1000  . insulin aspart  0-9 Units Subcutaneous TID WC  . metoprolol tartrate  12.5 mg Oral BID  . pantoprazole  40  mg Oral Daily  . pravastatin  40 mg Oral Daily  . rivaroxaban  20 mg Oral Q supper  . senna  1 tablet Oral BID  . sodium chloride flush  3 mL Intravenous Q12H    Family History    Family History  Problem Relation Age of Onset  . Heart disease Mother   . Heart failure Mother   . Heart disease Father   . Heart failure Father   . Heart disease Brother   . Heart failure Brother     Social History    Social History   Social History  . Marital status: Legally Separated    Spouse name: N/A  . Number of children: N/A  . Years of education: N/A   Occupational History  . Retired    Social History Main Topics  . Smoking status: Former Smoker    Packs/day: 2.00    Years: 20.00    Types: Cigarettes    Quit date: 05/02/1979  . Smokeless tobacco: Current User    Types: Chew     Comment: Chew 3x / wk  . Alcohol use No     Comment: 03-15-16 "quit before I was 76 years old"  . Drug use: No  . Sexual activity: No   Other Topics Concern  . Not on file   Social History Narrative   Lives alone, daughter lives next door. Family helps with errands, transportation.     Review of Systems    General:  No chills, fever, night sweats or weight changes.  Cardiovascular:  No chest pain, dyspnea on exertion, edema, orthopnea, palpitations, paroxysmal nocturnal dyspnea. Dermatological: No rash, lesions/masses Respiratory: No cough, dyspnea Urologic: No hematuria, dysuria Abdominal:   No nausea, vomiting, diarrhea, bright red blood per rectum, melena, or hematemesis Neurologic:  See HPI All other systems reviewed and are otherwise negative except as noted above.  Physical Exam    Blood pressure (!) 94/55, pulse 75, temperature 97.5 F (36.4 C), temperature source Oral, resp. rate 18, height 5\' 8"  (1.727 m), weight 215 lb 4.8 oz (97.7 kg), SpO2 98 %.  General: Pleasant older WM, NAD Psych: Normal affect. Neuro: Alert and oriented X 3. Moves all extremities spontaneously. HEENT:  Normal  Neck: Supple without bruits or JVD. Lungs:  Resp regular and unlabored, CTA. Heart: Irreg, Irreg no s3, s4, or murmurs. Abdomen: Soft, non-tender, non-distended, BS + x 4.  Extremities: No clubbing, cyanosis or edema. DP/PT/Radials 2+ and equal bilaterally.  Labs    Troponin (Point of Care Test) No results for input(s): TROPIPOC in the last 72 hours.  Recent Labs  Mar 15, 2016 1022 2016-03-15 1705 03-15-2016 2213 02/28/16 0428  CKTOTAL 1,019*  --   --  996*  TROPONINI 0.31* 0.23* 0.19*  --    Lab Results  Component Value  Date   WBC 8.8 02/28/2016   HGB 12.8 (L) 02/28/2016   HCT 38.3 (L) 02/28/2016   MCV 92.5 02/28/2016   PLT 177 02/28/2016    Recent Labs Lab 02/28/16 0428  NA 136  K 3.6  CL 108  CO2 22  BUN 16  CREATININE 1.32*  CALCIUM 7.6*  PROT 5.0*  BILITOT 1.3*  ALKPHOS 44  ALT 17  AST 46*  GLUCOSE 77   Lab Results  Component Value Date   CHOL 126 05/11/2014   HDL 50 05/11/2014   LDLCALC 44 05/11/2014   TRIG 161 (H) 05/11/2014   No results found for: [facility]   Radiology Studies    Ct Head Wo Contrast  Result Date: 02/26/2016 CLINICAL DATA:  Fall at home with head injury. EXAM: CT HEAD WITHOUT CONTRAST CT CERVICAL SPINE WITHOUT CONTRAST TECHNIQUE: Multidetector CT imaging of the head and cervical spine was performed following the standard protocol without intravenous contrast. Multiplanar CT image reconstructions of the cervical spine were also generated. COMPARISON:  None. FINDINGS: CT HEAD FINDINGS Brain: No evidence of parenchymal hemorrhage or extra-axial fluid collection. No mass lesion, mass effect, or midline shift. No CT evidence of acute transcortical infarction. Small left basal ganglia lacune of uncertain chronicity, probably chronic . Generalized cerebral volume loss. Intracranial atherosclerosis. Nonspecific mild subcortical and periventricular white matter hypodensity, most in keeping with chronic small vessel ischemic change. No  ventriculomegaly. Vascular: No hyperdense vessel or unexpected calcification. Skull: No evidence of calvarial fracture. Sinuses/Orbits: The visualized paranasal sinuses are essentially clear. Other: The right mastoid air cells are unopacified. Small inferior left mastoid effusion. CT CERVICAL SPINE FINDINGS Alignment: Normal cervical lordosis. No subluxation. Dens is well positioned between the lateral masses of C1. Skull base and vertebrae: No acute fracture. No primary bone lesion or focal pathologic process. Soft tissues and spinal canal: No prevertebral fluid or swelling. No visible canal hematoma. Disc levels: Mild-to-moderate degenerative disc disease in the mid to lower cervical spine, most prominent at C5-6. Moderate bilateral facet arthropathy. Mild to moderate degenerate foraminal stenosis on the right at C3-4. Upper chest: Negative. Other: Visualized right mastoid air cells appear clear. Nonspecific small inferior left mastoid effusion. No discrete thyroid nodules. 1.4 x 1.0 cm mass at the inferior margin of the right parotid gland (series 8/ image 39). IMPRESSION: 1. No evidence of acute intracranial abnormality. No evidence of calvarial fracture. 2. Nonspecific small left mastoid effusion. 3. Generalized cerebral volume loss. Mild chronic small vessel ischemia. 4. Small left basal ganglia lacune, probably chronic. 5. No cervical spine fracture or subluxation. 6. Mild-to-moderate degenerative changes in the cervical spine as described . 7. **An incidental finding of potential clinical significance has been found. Nonspecific 1.4 x 1.0 cm mass at the inferior margin of the right parotid gland, which may represent a mildly enlarged periparotid lymph node. Consider correlation with targeted parotid ultrasound and/or MRI neck without and with IV contrast. ** Electronically Signed   By: Delbert Phenix M.D.   On: 02/26/2016 18:05   Ct Cervical Spine Wo Contrast  Result Date: 02/26/2016 CLINICAL DATA:  Fall at  home with head injury. EXAM: CT HEAD WITHOUT CONTRAST CT CERVICAL SPINE WITHOUT CONTRAST TECHNIQUE: Multidetector CT imaging of the head and cervical spine was performed following the standard protocol without intravenous contrast. Multiplanar CT image reconstructions of the cervical spine were also generated. COMPARISON:  None. FINDINGS: CT HEAD FINDINGS Brain: No evidence of parenchymal hemorrhage or extra-axial fluid collection. No mass lesion, mass effect,  or midline shift. No CT evidence of acute transcortical infarction. Small left basal ganglia lacune of uncertain chronicity, probably chronic . Generalized cerebral volume loss. Intracranial atherosclerosis. Nonspecific mild subcortical and periventricular white matter hypodensity, most in keeping with chronic small vessel ischemic change. No ventriculomegaly. Vascular: No hyperdense vessel or unexpected calcification. Skull: No evidence of calvarial fracture. Sinuses/Orbits: The visualized paranasal sinuses are essentially clear. Other: The right mastoid air cells are unopacified. Small inferior left mastoid effusion. CT CERVICAL SPINE FINDINGS Alignment: Normal cervical lordosis. No subluxation. Dens is well positioned between the lateral masses of C1. Skull base and vertebrae: No acute fracture. No primary bone lesion or focal pathologic process. Soft tissues and spinal canal: No prevertebral fluid or swelling. No visible canal hematoma. Disc levels: Mild-to-moderate degenerative disc disease in the mid to lower cervical spine, most prominent at C5-6. Moderate bilateral facet arthropathy. Mild to moderate degenerate foraminal stenosis on the right at C3-4. Upper chest: Negative. Other: Visualized right mastoid air cells appear clear. Nonspecific small inferior left mastoid effusion. No discrete thyroid nodules. 1.4 x 1.0 cm mass at the inferior margin of the right parotid gland (series 8/ image 39). IMPRESSION: 1. No evidence of acute intracranial  abnormality. No evidence of calvarial fracture. 2. Nonspecific small left mastoid effusion. 3. Generalized cerebral volume loss. Mild chronic small vessel ischemia. 4. Small left basal ganglia lacune, probably chronic. 5. No cervical spine fracture or subluxation. 6. Mild-to-moderate degenerative changes in the cervical spine as described . 7. **An incidental finding of potential clinical significance has been found. Nonspecific 1.4 x 1.0 cm mass at the inferior margin of the right parotid gland, which may represent a mildly enlarged periparotid lymph node. Consider correlation with targeted parotid ultrasound and/or MRI neck without and with IV contrast. ** Electronically Signed   By: Delbert PhenixJason A Poff M.D.   On: 02/26/2016 18:05   Dg Chest Portable 1 View  Result Date: 03/26/2016 CLINICAL DATA:  Hypotension.  Fall yesterday.  Weakness. EXAM: PORTABLE CHEST 1 VIEW COMPARISON:  05/01/2015 chest radiograph. FINDINGS: Stable configuration of 2 lead left subclavian pacemaker. Stable cardiomediastinal silhouette with normal heart size and aortic atherosclerosis. No pneumothorax. No pleural effusion. Lungs appear clear, with no acute consolidative airspace disease and no pulmonary edema. IMPRESSION: 1. No active disease in the chest . 2. Aortic atherosclerosis. Electronically Signed   By: Delbert PhenixJason A Poff M.D.   On: 04/21/2016 10:44   Dg Hips Bilat W Or Wo Pelvis 2 Views  Result Date: 02/26/2016 CLINICAL DATA:  Fall today.  Bilateral hip pain. EXAM: DG HIP (WITH OR WITHOUT PELVIS) 2V BILAT COMPARISON:  12/27/2008 CT abdomen/pelvis . FINDINGS: No pelvic fracture or diastasis. No hip fracture or dislocation. 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 Spondylosis in the visualized lower lumbar spine. No suspicious focal osseous lesions. No radiopaque foreign body. IMPRESSION: No fracture. Electronically Signed   By: Delbert PhenixJason A Poff M.D.   On: 02/26/2016 18:16    ECG & Cardiac Imaging    EKG: AF  with intermittent V pacing  Echo:12/18/08  Left ventricle: The cavity size was normal. Wall thickness was normal. Systolic function was normal. The estimated ejection fraction was in the range of 55% to 60%. Features are consistent with a pseudonormal left ventricular filling pattern, with concomitant abnormal relaxation and increased filling pressure (grade 2 diastolic dysfunction).    Assessment & Plan    76 yo male with PMH of CHB s/p St Jude dual chamber, GERD, HTN, HLD, NIDDM  who presented to the ED after a fall with hypotension, dehydration and weakness.  1. Elevated Trop: Peaked at 0.31>>0.19. Denies any chest pain, dyspnea, palpitations, lightheadedness prior to falling. Did remain on the floor for 12 hours, and has a CK total of 996. Trop be mildly elevated in the setting of rhabdomyolysis from his fall. Has not had an ischemic work up in the past, but does have family hx of CAD, HTN, former smoker, HLD , and DM. EKG did have new TWI noted this admission. -- start with echo for EF and WMA.   2. Fall/Syncope?: States he remember falling, but events seem alittle unclear. Work up was unremarkable on first visit to the ED, but presented back with dehydration and hypotension within 24 hours. Does have hx of CHB s/p PPM and AF. On Xarelto, but no reports of bleeding and Hgb stable. -- Will have device interrogated, check echo   3. Hypotension/dehydration: Reports decreased oral intake over the past 2 days. Was given IV fluids in the ED with improvement, but blood pressure is still soft. Also was orthostatic. Cr elevated on admission, but now improved.   4. PAF/ CHB s/p PPM: AF noted on EKG, rate on telemetry is generally controlled but some tachy rates noted. Does not appear to be on rate controlling medication at home. Has been started on low dose BB by primary team. On Xarelto. Last interrogation 02/16/16 76% AT/AF with some episodes of AF RVR.   5. Weakness: Reports ongoing  progressive weakness over the past couple of months. May benefit from home PT and assistive devices?  6. HLD: on statin  7. NIDDM: on SSI, last Hgb A1c 6.8  Signed, Laverda Page, NP-C Pager [phone] 02/28/2016, 9:29 AM   The patient has been seen in conjunction with Laverda Page, NP-C. All aspects of care have been considered and discussed. The patient has been personally interviewed, examined, and all clinical data has been reviewed.   Possible episode of syncope, hypotension, prolonged downtime after episode, rhabdomyolysis, progressive weakness over time, and prior history of complete heart block requiring permanent pacemaker.  There is not a specific diagnosis that unifies the clinical abnormalities identified. We need to exclude tachyarrhythmia (interrogate device- has atrial fib led to decreased LV systolic function), significant LV systolic dysfunction (echocardiogram), severe pulmonary hypertension (echocardiogram), and possibly severe three-vessel coronary disease, but this seems less likely. The echo will be most important. We will follow with you.

## 2016-02-28 NOTE — Progress Notes (Signed)
Admitted pt to rm 3E09 from ED, pt alert and oriented, denied pain at this time, oriented to room, call bell placed within reach. Placed on cardiac monitor, CCMD made aware. Bed alarm activated.

## 2016-02-28 NOTE — Progress Notes (Signed)
   02/28/16 0016  Vitals  Temp 97.7 F (36.5 C)  Temp Source Oral  BP (!) 80/60  BP Location Right Arm  BP Method Automatic  Patient Position (if appropriate) Lying  Pulse Rate 70  Pulse Rate Source Dinamap  Resp 18  Oxygen Therapy  SpO2 96 %  O2 Device Room Air  Pt's BP low, pt resting in bed, no c/o pain. Pt on NS at 125cc/hr, MD made aware, no new orders made.

## 2016-02-28 NOTE — Progress Notes (Signed)
   02/28/16 0900  Clinical Encounter Type  Visited With Patient  Visit Type Initial;Spiritual support  Referral From Patient;Nurse  Consult/Referral To Chaplain  Spiritual Encounters  Spiritual Needs Prayer;Emotional  Stress Factors  Patient Stress Factors Exhausted;Health changes   Chaplain responded to Silvana consult. Pt is having difficulty processing health changes wished to have chaplain present. Chaplain sat with patient, provided ministry of presence and prayer.

## 2016-02-28 NOTE — Evaluation (Signed)
Occupational Therapy Evaluation Patient Details Name: Darin LimboJimmy A Gonzalez MRN: [id number] DOB: 1940/07/10 Today's Date: 02/28/2016    History of Present Illness Pt adm after fall at home. Pt found to be hypotensive and dehydrated. PMH - DM, HTN, afib, pacer   Clinical Impression   Pt admitted with the above diagnoses and presents with below problem list. Pt will benefit from continued OT to address the below listed deficits and maximize independence with basic ADLs prior to d/c to venue below. PTA pt was independent with ADLs. Pt is currently set-up with UB ADLs, min A with LB ADLs, min guard for functional mobility using rw. Pt verbalizing concern that he will not have enough help at home once he d/c from hospital. Pt asking about resources for having someone "who could check in on me once a day."     Follow Up Recommendations  Home health OT;Supervision - Intermittent    Equipment Recommendations  3 in 1 bedside commode    Recommendations for Other Services       Precautions / Restrictions Precautions Precautions: Fall Restrictions Weight Bearing Restrictions: No      Mobility Bed Mobility Overal bed mobility: Needs Assistance Bed Mobility: Supine to Sit;Sit to Supine     Supine to sit: Mod assist;HOB elevated Sit to supine: Min guard   General bed mobility comments: Pt asking for +2 assist to come to EOB. With minimal encouragement, pt iniated and completed with +1 A. Pt using BUE HHA of therapist to powerup to EOB.   Transfers Overall transfer level: Needs assistance Equipment used: Rolling walker (2 wheeled) Transfers: Sit to/from Stand Sit to Stand: Min assist;Supervision         General transfer comment: Pt using B hands on rw to faciliate powerup to stand despite verbal cues for hand placement. Therapist providing support to stabilize rw, min A to steady balance during powerup    Balance Overall balance assessment: Needs assistance Sitting-balance support:  No upper extremity supported;Feet supported Sitting balance-Leahy Scale: Good     Standing balance support: No upper extremity supported;Bilateral upper extremity supported Standing balance-Leahy Scale: Fair                              ADL Overall ADL's : Needs assistance/impaired Eating/Feeding: Set up;Sitting   Grooming: Set up;Sitting   Upper Body Bathing: Set up;Sitting   Lower Body Bathing: Sit to/from stand;Minimal assistance   Upper Body Dressing : Set up;Sitting   Lower Body Dressing: Sit to/from stand;Minimal assistance   Toilet Transfer: Min guard;Ambulation;RW;Minimal assistance   Toileting- Clothing Manipulation and Hygiene: Set up;Sitting/lateral lean     Tub/Shower Transfer Details (indicate cue type and reason): sponge bathes at baseline due to fear of falling during tub transfer Functional mobility during ADLs: Min guard;Rolling walker General ADL Comments: Pt iniatially asking for +2 assist with bed mobility and in-room functional mobility, reporting B hip pain and fear of falling. Anxious. Pt completed in-room functional mobility, bed mobility, and eating as detailed above. ADL/functional transfer performance improves with encouragement and cueing.     Vision     Perception     Praxis      Pertinent Vitals/Pain Pain Assessment: 0-10 Pain Score: 5  Faces Pain Scale: Hurts little more Pain Location: hips Pain Descriptors / Indicators: Guarding;Grimacing Pain Intervention(s): Limited activity within patient's tolerance;Monitored during session;Repositioned;Patient requesting pain meds-RN notified     Hand Dominance     Extremity/Trunk  Assessment Upper Extremity Assessment Upper Extremity Assessment: Generalized weakness   Lower Extremity Assessment Lower Extremity Assessment: Defer to PT evaluation       Communication Communication Communication: No difficulties   Cognition Arousal/Alertness: Awake/alert Behavior During  Therapy: Anxious;WFL for tasks assessed/performed Overall Cognitive Status: History of cognitive impairments - at baseline Area of Impairment: Memory     Memory: Decreased short-term memory         General Comments: son reports increasing forgetfulness   General Comments       Exercises       Shoulder Instructions      Home Living Family/patient expects to be discharged to:: Private residence Living Arrangements: Alone Available Help at Discharge: Family;Available PRN/intermittently Type of Home: House Home Access: Stairs to enter [employer] of Steps: 3-4 Entrance Stairs-Rails: Right Home Layout: One level     Bathroom Shower/Tub: Tub/shower unit Shower/tub characteristics: Engineer, building services: Standard     Home Equipment:  (walking stick)   Additional Comments: sponge bathes for fear of falling during tub shower transfer.      Prior Functioning/Environment Level of Independence: Independent                 OT Problem List: Decreased strength;Decreased activity tolerance;Impaired balance (sitting and/or standing);Decreased cognition;Decreased safety awareness;Decreased knowledge of use of DME or AE;Decreased knowledge of precautions   OT Treatment/Interventions: Self-care/ADL training;Therapeutic exercise;Energy conservation;DME and/or AE instruction;Therapeutic activities;Cognitive remediation/compensation;Patient/family education;Balance training    OT Goals(Current goals can be found in the care plan section) Acute Rehab OT Goals Patient Stated Goal: return home OT Goal Formulation: With patient Time For Goal Achievement: 03/13/16 Potential to Achieve Goals: Good ADL Goals Pt Will Perform Grooming: with modified independence;sitting;standing Pt Will Perform Upper Body Bathing: with modified independence;sitting Pt Will Perform Lower Body Bathing: with modified independence;sit to/from stand Pt Will Perform Upper Body Dressing: with  modified independence;sitting Pt Will Perform Lower Body Dressing: with modified independence;sit to/from stand Pt Will Transfer to Toilet: with modified independence;ambulating Pt Will Perform Toileting - Clothing Manipulation and hygiene: with modified independence;sit to/from stand;sitting/lateral leans Pt/caregiver will Perform Home Exercise Program: Increased strength;Both right and left upper extremity;With theraband;With written HEP provided (general strengthening)  OT Frequency: Min 2X/week   Barriers to D/C: Decreased caregiver support  lives alone with intermittent assist from family       Co-evaluation              End of Session Equipment Utilized During Treatment: Gait belt;Rolling walker Nurse Communication: Patient requests pain meds;Other (comment) ("I need my pain cream for my hips.")  Activity Tolerance: Patient tolerated treatment well Patient left: in bed;with call bell/phone within reach;with bed alarm set   Time: 1610-9604 OT Time Calculation (min): 20 min Charges:  OT General Charges $OT Visit: 1 Procedure OT Evaluation $OT Eval Low Complexity: 1 Procedure G-Codes: OT G-codes **NOT FOR INPATIENT CLASS** Functional Assessment Tool Used: clinical judgement Functional Limitation: Self care Self Care Current Status (V4098): At least 1 percent but less than 20 percent impaired, limited or restricted Self Care Goal Status (J1914): At least 1 percent but less than 20 percent impaired, limited or restricted  Pilar Grammes 02/28/2016, 1:03 PM

## 2016-02-29 ENCOUNTER — Inpatient Hospital Stay (HOSPITAL_COMMUNITY): Payer: Medicare HMO

## 2016-02-29 DIAGNOSIS — R5383 Other fatigue: Secondary | ICD-10-CM

## 2016-02-29 DIAGNOSIS — R7989 Other specified abnormal findings of blood chemistry: Secondary | ICD-10-CM

## 2016-02-29 DIAGNOSIS — R55 Syncope and collapse: Secondary | ICD-10-CM

## 2016-02-29 LAB — GLUCOSE, CAPILLARY
GLUCOSE-CAPILLARY: 105 mg/dL — AB (ref 65–99)
Glucose-Capillary: 132 mg/dL — ABNORMAL HIGH (ref 65–99)
Glucose-Capillary: 157 mg/dL — ABNORMAL HIGH (ref 65–99)
Glucose-Capillary: 108 mg/dL — ABNORMAL HIGH (ref 65–99)

## 2016-02-29 LAB — ECHOCARDIOGRAM COMPLETE
HEIGHTINCHES: 68 in
WEIGHTICAEL: 3572.8 [oz_av]

## 2016-02-29 LAB — BASIC METABOLIC PANEL
Anion gap: 8 (ref 5–15)
BUN: 13 mg/dL (ref 6–20)
CALCIUM: 7.2 mg/dL — AB (ref 8.9–10.3)
CO2: 19 mmol/L — ABNORMAL LOW (ref 22–32)
CREATININE: 1.22 mg/dL (ref 0.61–1.24)
Chloride: 110 mmol/L (ref 101–111)
GFR calc Af Amer: >60 mL/min (ref >60)
GFR, EST NON AFRICAN AMERICAN: 56 mL/min — AB (ref >60)
Glucose, Bld: 81 mg/dL (ref 65–99)
Potassium: 3.6 mmol/L (ref 3.5–5.1)
SODIUM: 137 mmol/L (ref 135–145)

## 2016-02-29 LAB — PHOSPHORUS: PHOSPHORUS: 1.8 mg/dL — AB (ref 2.5–4.6)

## 2016-02-29 MED ORDER — MELATONIN 3 MG PO TABS
3.0000 mg | ORAL_TABLET | Freq: Every evening | ORAL | Status: DC | PRN
  Administered 2016-02-29 – 2016-03-04 (×3): 3 mg via ORAL
  Filled 2016-02-29 (×6): qty 1

## 2016-02-29 MED ORDER — K PHOS MONO-SOD PHOS DI & MONO 155-852-130 MG PO TABS
250.0000 mg | ORAL_TABLET | Freq: Once | ORAL | Status: AC
  Administered 2016-02-29: 250 mg via ORAL
  Filled 2016-02-29: qty 1

## 2016-02-29 MED ORDER — PERFLUTREN LIPID MICROSPHERE
1.0000 mL | INTRAVENOUS | Status: AC | PRN
  Administered 2016-02-29: 2 mL via INTRAVENOUS
  Filled 2016-02-29: qty 10

## 2016-02-29 MED ORDER — NON FORMULARY: 5.0000 mg | Freq: Every evening | Status: DC | PRN

## 2016-02-29 MED ORDER — METOPROLOL SUCCINATE ER 25 MG PO TB24
12.5000 mg | ORAL_TABLET | Freq: Every day | ORAL | Status: DC
  Administered 2016-02-29 – 2016-03-02 (×3): 12.5 mg via ORAL
  Filled 2016-02-29 (×3): qty 1

## 2016-02-29 MED ORDER — SODIUM CHLORIDE 0.9 % IV SOLN
INTRAVENOUS | Status: AC
  Administered 2016-02-29: 17:00:00 via INTRAVENOUS

## 2016-02-29 MED ORDER — AMIODARONE HCL 200 MG PO TABS
200.0000 mg | ORAL_TABLET | Freq: Two times a day (BID) | ORAL | Status: DC
  Administered 2016-02-29 – 2016-03-02 (×5): 200 mg via ORAL
  Filled 2016-02-29 (×5): qty 1

## 2016-02-29 NOTE — Progress Notes (Signed)
Subjective: No acute events or changes to condition overnight.  He has been up and walking throughout the day. He notes that he does sometimes feel "woozy." He says that in the past sometimes when he stand up too quickly he gets a little dizzy. He also has complaints about the bruising on his arm likely in part due to his rivaroxaban.  Objective: Vital signs in last 24 hours: Vitals:   02/28/16 2100 02/28/16 2127 02/29/16 0007 02/29/16 0455  BP: (!) 90/50 96/62 90/60  (!) 90/58  Pulse: 74 89 (!) 105 84  Resp:  18 18 18   Temp:  97.7 F (36.5 C) 97.6 F (36.4 C) 97.3 F (36.3 C)  TempSrc:  Oral Oral Oral  SpO2:  99% 95% 100%  Weight:    101.3 kg (223 lb 4.8 oz)  Height:       Weight change: 3.992 kg (8 lb 12.8 oz)  Intake/Output Summary (Last 24 hours) at 02/29/16 16100807 Last data filed at 02/29/16 0553  Gross per 24 hour  Intake          1046.25 ml  Output              951 ml  Net            95.25 ml    Physical Exam Gen: Well-appearing in NAD Neuro: Strength 5/5 bilaterally in upper extremity and 4/5 in lower extremities.   HEENT: NCAT. EOMI. Moist mucous membranes. Right sided neck mass.  CV: Distant heart sounds in irregular rhythm.  Pulm:  CTAB, no wheezes or rhonchi. Abdomen: Soft, non-tender to palpation in four quadrants.  Normal BS appreciated.  Extremities: Ecchymosis and closed lesions on L arm.    Lab Results:  Repeat UA Small Hgb urine dipstick and 0-5 RBC/hpf  SCr 1.66>1.32> 1.22 (Today) Troponin I - last checked 1/3 at 0.19 down from initial 0.31  Micro Results: Recent Results (from the past 240 hour(s))  Urine culture     Status: None   Collection Time: 05/23/2016 12:30 PM  Result Value Ref Range Status   Specimen Description URINE, RANDOM  Final   Special Requests NONE  Final   Culture NO GROWTH  Final   Report Status 02/28/2016 FINAL  Final   Studies/Results: [place] Soft Tissue Head And Neck  Result Date: 02/28/2016 CLINICAL DATA:  Neck mass EXAM:  ULTRASOUND OF HEAD/NECK SOFT TISSUES TECHNIQUE: Ultrasound examination of the head and neck soft tissues was performed in the area of clinical concern. COMPARISON:  None. FINDINGS: The palpable abnormality corresponds to a a 1.4 x 0.9 x 0.8 cm benign-appearing cyst within the deep subcutaneous tissue at the inferior margin of the right parotid gland. IMPRESSION: The palpable abnormality corresponds to a benign-appearing cyst within the inferior margin of the right parotid gland measuring up to 1.4 cm. Electronically Signed   By: Jolaine ClickArthur  Hoss M.D.   On: 02/28/2016 16:07   Dg Chest Portable 1 View  Result Date: 02/26/2016 CLINICAL DATA:  Hypotension.  Fall yesterday.  Weakness. EXAM: PORTABLE CHEST 1 VIEW COMPARISON:  05/01/2015 chest radiograph. FINDINGS: Stable configuration of 2 lead left subclavian pacemaker. Stable cardiomediastinal silhouette with normal heart size and aortic atherosclerosis. No pneumothorax. No pleural effusion. Lungs appear clear, with no acute consolidative airspace disease and no pulmonary edema. IMPRESSION: 1. No active disease in the chest . 2. Aortic atherosclerosis. Electronically Signed   By: Delbert PhenixJason A Poff M.D.   On: 10-22-16 10:44   Medications: I have reviewed the patient's current  medications. Scheduled Meds: . diclofenac sodium  2 g Topical QID  . feeding supplement (ENSURE ENLIVE)  237 mL Oral BID BM  . fluticasone  2 spray Each Nare Daily  . metoprolol succinate  12.5 mg Oral Daily  . pantoprazole  40 mg Oral Daily  . pravastatin  40 mg Oral Daily  . rivaroxaban  20 mg Oral Q supper  . senna  1 tablet Oral BID  . sodium chloride flush  3 mL Intravenous Q12H   Continuous Infusions: PRN Meds:.acetaminophen **OR** acetaminophen, promethazine Assessment/Plan:  Darin Gonzalez is a 76 yo man with h/o PAF, CHB s/p pacemaker 04/2015,  DM, HTN, HLDwho presented on 1/3 with hypotension, generalized weakness, and recent fall at home concerning for dehydration and possible  cardiac ischemia.    Active Problems:   Diabetes mellitus type 2, noninsulin dependent (HCC)   Essential hypertension   PAF (paroxysmal atrial fibrillation) (HCC)   Dehydration   Elevated CK   Elevated serum creatinine   Elevated troponin   Neck mass   Hypotension and dehydration, with earlier presentation for fall: Bp lower than baseline at ~80/50.  Negative testing for orthostatic hypotension on March 21, 2016. - continue PT and OT -continue ensure feeding supplementation  Elevated troponins:0.31-->0.23-->0.19. Likely demand ischemia/ afib precipitation. Pt has no chest pain. EKGs showing TWI  - ASA 325mg  - Following cardiology recs -Echo for EF and WMA  Afib: Pt is on Xarelto and newly added low dose metoprolol. Pacemaker interrogation shows he has been in AF since June.  CHAD2VASC score of at least 4. - telemetry - continue xarelto - per cardiology possible cardioversion depending on echo results -metoprolol XL 12.5 mg for rate control  Incidental right parotid gland mass-incidentally found on CT. U/s revealed benign appearing cystic mass.   GERD: - continue protonix   DM: Last A1c in March 2017 being 6.8. Pt on metformin. -SSI-S TID WC  HTN: BP normotensive -metoprolol XL 12.5   AKI: Resolving with IV fluids. Serum Cr today 1.22  Elevated CK / Hematuria- Had fall 3 days ago where he was on the ground for 12 hrs.  CK at 996 and 6-30 RBC/hpf on UA yesterday. Now on repeat UA small Hgb and 0-5 RBC/hpf.  Reassuring that this has now fallen and mild rhabo is from his fall 3 days ago.    This is a Psychologist, occupational Note.  The care of the patient was discussed with Dr. Laural Benes and the assessment and plan formulated with their assistance.  Please see their attached note for official documentation of the daily encounter.   LOS: 1 day   Darin Gonzalez, Medical Student 02/29/2016, 8:07 AM

## 2016-02-29 NOTE — Progress Notes (Addendum)
Subjective: Mr. Stute is feeling well this morning, energy level improved. He states that he has been up walking up and down the halls yesterday. He does endorse some dizziness currently but denies any while walking. His pressures remain soft in 90s/40-50s. He continues to complain of pain/aching over his elbows but has not had Voltaren gel applied to that location - which we advised him to try today.   Review of telemetry revealed intermittent episodes of brief tachyarrythmias, "pacer not pacing" reads, and bradycardia.  Objective: Vital signs in last 24 hours: Vitals:   02/28/16 2100 02/28/16 2127 02/29/16 0007 02/29/16 0455  BP: (!) 90/50 96/62 90/60  (!) 90/58  Pulse: 74 89 (!) 105 84  Resp:  18 18 18   Temp:  97.7 F (36.5 C) 97.6 F (36.4 C) 97.3 F (36.3 C)  TempSrc:  Oral Oral Oral  SpO2:  99% 95% 100%  Weight:    101.3 kg (223 lb 4.8 oz)  Height:        Intake/Output Summary (Last 24 hours) at 02/29/16 0709 Last data filed at 02/29/16 0553  Gross per 24 hour  Intake          1046.25 ml  Output              951 ml  Net            95.25 ml   Physical Exam Physical Exam  Constitutional: He is oriented to person, place, and time. He appears well-developed. No distress.  HENT:  Head: Normocephalic and atraumatic.  Mouth/Throat: Oropharynx is clear and moist.  Eyes: Conjunctivae are normal. Pupils are equal, round, and reactive to light.  Neck: Normal range of motion. Neck supple.  Cardiovascular: Normal rate and normal heart sounds.  An irregularly irregular rhythm present.  No murmur heard. Pulmonary/Chest: Effort normal and breath sounds normal. No respiratory distress.  Abdominal: Soft. He exhibits no distension. There is no tenderness.  Musculoskeletal: Normal range of motion. He exhibits deformity (left index finger missing). He exhibits no edema.  Neurological: He is alert and oriented to person, place, and time.  Proximal BLE weakness 4/5, otherwise 5/5  throughout  Skin: Skin is warm and dry. Capillary refill takes less than 2 seconds.  Hyperpigmentation over lower legs  Psychiatric: He has a normal mood and affect.  Vitals reviewed.  Labs / Imaging / Procedures: CBC Latest Ref Rng & Units 02/28/2016 03/06/2016 03/14/2016  WBC 4.0 - 10.5 K/uL 8.8 - 11.4(H)  Hemoglobin 13.0 - 17.0 g/dL 12.8(L) 13.6 13.9  Hematocrit 39.0 - 52.0 % 38.3(L) 40.0 41.2  Platelets 150 - 400 K/uL 177 - 200   BMP Latest Ref Rng & Units 02/29/2016 02/28/2016 03/25/2016  Glucose 65 - 99 mg/dL 81 77 90  BUN 6 - 20 mg/dL 13 16 20   Creatinine 0.61 - 1.24 mg/dL 8.41 3.24(M) 0.10(U)  Sodium 135 - 145 mmol/L 137 136 135  Potassium 3.5 - 5.1 mmol/L 3.6 3.6 4.1  Chloride 101 - 111 mmol/L 110 108 100(L)  CO2 22 - 32 mmol/L 19(L) 22 -  Calcium 8.9 - 10.3 mg/dL 7.2(L) 7.6(L) -   US Soft Tissue Head And Neck  Result Date: 02/28/2016 CLINICAL DATA:  Neck mass EXAM: ULTRASOUND OF HEAD/NECK SOFT TISSUES TECHNIQUE: Ultrasound examination of the head and neck soft tissues was performed in the area of clinical concern. COMPARISON:  None. FINDINGS: The palpable abnormality corresponds to a a 1.4 x 0.9 x 0.8 cm benign-appearing cyst within the deep subcutaneous  tissue at the inferior margin of the right parotid gland. IMPRESSION: The palpable abnormality corresponds to a benign-appearing cyst within the inferior margin of the right parotid gland measuring up to 1.4 cm. Electronically Signed   By: Jolaine Click M.D.   On: 02/28/2016 16:07   TTE 02/29/2016  Study Conclusions  - Left ventricle: ABnormal septal motion inferobasal hypokinesis   The cavity size was normal. Wall thickness was normal. Systolic   function was mildly reduced. The estimated ejection fraction was   in the range of 45% to 50%. - Left atrium: The atrium was mildly dilated. - Atrial septum: No defect or patent foramen ovale was identified.   Assessment/Plan: 76 yo man with history of DM, HTN, HLD, PAF, history of CHB  s/p pacemaker 04/2015 who presented on 1/3 with hypotension, generalized weakness, and recent fall at home.  Fall, vs syncope - unclear if mechanical fall vs syncope, coincided with profound weakness and remained on floor for 12 hours, having persistent weakness and hypotension, also with poor po intake. History of CHB with pacemaker and PAF, on Xarelto - CT head, c-spine, hip XR with no acute abnml/fracture - Appreciating cardiology recs, pacemaker interrogation reveals AF since June 2017, rate HR 70s, some episodes of RVR - may undergo DCCV - TTE today, LVEF 45-50% with inferobasal hypokniesis, mild LA dilation - Telemetry - PT OT eval/treat - recommending HH PTOT and intermittent supervision - Tylenol PRN for aches/pains - Voltaren gel PRN for aches/pains  Elevated troponins, peaked at 0.31 on admission, trended down to 0.19, EKG with new TWI in I, aVR, aVL, V2-V6 per cardiology. He did have elevated CK, likely mild rhabdomyolysis following fall/prolonged down time. High risk patient with DM, HTN, HLD, former smoker, family history CAD. TTE today with inferobasal hypokinesis concerning for ischemic injury at some point - Given Aspirin 325 mg - Follow cardiology recommendations w/r/t ischemic workup  Hypotension - persisting this morning SBP 90-100s / 40-50s, decreased fluid and po intake over the past 2 days, mild AKI on admission, now s/p multiple IV fluid boluses in the ED. Orthostatics negative on admission - Reduced/changed to Metoprolol XR 12.5 mg  PAF and CHB s/p pacemaker, CHADSVASC score 7, in AF with normal rates, some intermittent tachyrhythmia on telemetry. On Xarelto but no rate control medication at home. Cardiologist is Dr. Ladona Ridgel but overdue for follow up visit, has been rescheduling appointments. - Metoprolol XR 12.5 mg QD - Xarelto 20 mg daily - Telemetry  HFrEF, unclear if acute vs chronic, LVEF 45-50% with inferobasilar hypokinesis. 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 - On beta blocker, hold of on starting ACE inhibitor given ongoing hypotension  Neck mass, adjacent to right parotid gland, incidental finding on CT imaging, longstanding chewing tobacco use, remote smoking history, palpable on exam. Ultrasound on 1/4 found it to be benign cyst associated with right parotid gland.  Hematuria, transient, noted on admission U/A, 6-30 RBCs, in setting of mild rhabdo with elevated CK to 1019. Repeat UA on 1/4 with 0-5 RBCs,   AKI, mild, resolving, creatinine 1.2 from 1.50 on admission - improving with IV fluids and hydration  T2DM, HbA1c 5.4, normal glucose on repeated checks this admission - Dc SSI - CBG Q shift  GERD - continue home Protonix HLD - continue home Pravachol  Dispo: Anticipated discharge in approximately 1-2 days.  LOS: 1 day   Althia Forts, MD 02/29/2016, 7:09 AM Pager: [phone]

## 2016-02-29 NOTE — Progress Notes (Addendum)
Patient Name: Darin Gonzalez Date of Encounter: 02/29/2016  Primary Cardiologist: Dr. Renee Rival Problem List     Active Problems:   Diabetes mellitus type 2, noninsulin dependent (HCC)   Essential hypertension   PAF (paroxysmal atrial fibrillation) (HCC)   Dehydration   Elevated CK   Elevated serum creatinine   Elevated troponin   Neck mass   Subjective   Feeling well this morning. Walked in the hallway yesterday without any problems.   Inpatient Medications    Scheduled Meds: . diclofenac sodium  2 g Topical QID  . feeding supplement (ENSURE ENLIVE)  237 mL Oral BID BM  . fluticasone  2 spray Each Nare Daily  . metoprolol succinate  12.5 mg Oral Daily  . pantoprazole  40 mg Oral Daily  . pravastatin  40 mg Oral Daily  . rivaroxaban  20 mg Oral Q supper  . senna  1 tablet Oral BID  . sodium chloride flush  3 mL Intravenous Q12H   Continuous Infusions:  PRN Meds: acetaminophen **OR** acetaminophen, promethazine   Vital Signs    Vitals:   02/28/16 2100 02/28/16 2127 02/29/16 0007 02/29/16 0455  BP: (!) 90/50 96/62 90/60  (!) 90/58  Pulse: 74 89 (!) 105 84  Resp:  18 18 18   Temp:  97.7 F (36.5 C) 97.6 F (36.4 C) 97.3 F (36.3 C)  TempSrc:  Oral Oral Oral  SpO2:  99% 95% 100%  Weight:    223 lb 4.8 oz (101.3 kg)  Height:        Intake/Output Summary (Last 24 hours) at 02/29/16 0806 Last data filed at 02/29/16 0553  Gross per 24 hour  Intake          1046.25 ml  Output              951 ml  Net            95.25 ml   Filed Weights   03/17/2016 2002 02/28/16 0449 02/29/16 0455  Weight: 214 lb 8 oz (97.3 kg) 215 lb 4.8 oz (97.7 kg) 223 lb 4.8 oz (101.3 kg)    Physical Exam    GEN: Well nourished, well developed, in no acute distress.  HEENT: Grossly normal.  Neck: Supple, no JVD, carotid bruits, or masses. Cardiac: Irreg Irreg, no murmurs, rubs, or gallops. No clubbing, cyanosis, edema.  Radials/DP/PT 2+ and equal bilaterally.  Respiratory:   Respirations regular and unlabored, clear to auscultation bilaterally. GI: Soft, nontender, nondistended, BS + x 4. MS: no deformity or atrophy. Skin: warm and dry, no rash. Neuro:  Strength and sensation are intact. Psych: AAOx3.  Normal affect.  Labs    CBC  Recent Labs  03/02/2016 1022 03/01/2016 1113 02/28/16 0428  WBC 11.4*  --  8.8  NEUTROABS 9.9*  --   --   HGB 13.9 13.6 12.8*  HCT 41.2 40.0 38.3*  MCV 91.6  --  92.5  PLT 200  --  177   Basic Metabolic Panel  Recent Labs  02/28/16 0428 02/29/16 0345  NA 136 137  K 3.6 3.6  CL 108 110  CO2 22 19*  GLUCOSE 77 81  BUN 16 13  CREATININE 1.32* 1.22  CALCIUM 7.6* 7.2*  PHOS  --  1.8*   Liver Function Tests  Recent Labs  03/20/2016 1022 02/28/16 0428  AST 44* 46*  ALT 16* 17  ALKPHOS 51 44  BILITOT 2.1* 1.3*  PROT 5.8* 5.0*  ALBUMIN 3.2* 2.8*  No results for input(s): LIPASE, AMYLASE in the last 72 hours. Cardiac Enzymes  Recent Labs  03/22/2016 1022 03/09/2016 1705 03/01/2016 2213 02/28/16 0428  CKTOTAL 1,019*  --   --  996*  TROPONINI 0.31* 0.23* 0.19*  --    BNP Invalid input(s): POCBNP D-Dimer No results for input(s): DDIMER in the last 72 hours. Hemoglobin A1C  Recent Labs  03/21/2016 1705  HGBA1C 5.4   Fasting Lipid Panel No results for input(s): CHOL, HDL, LDLCALC, TRIG, CHOLHDL, LDLDIRECT in the last 72 hours. Thyroid Function Tests  Recent Labs  03/07/2016 1705  TSH 1.035    Telemetry    AF with intermittent Vpacing rate controlled - Personally Reviewed  ECG    AF with intermittent Vpacing new TWI - Personally Reviewed  Radiology    US Soft Tissue Head And Neck  Result Date: 02/28/2016 CLINICAL DATA:  Neck mass EXAM: ULTRASOUND OF HEAD/NECK SOFT TISSUES TECHNIQUE: Ultrasound examination of the head and neck soft tissues was performed in the area of clinical concern. COMPARISON:  None. FINDINGS: The palpable abnormality corresponds to a a 1.4 x 0.9 x 0.8 cm benign-appearing cyst  within the deep subcutaneous tissue at the inferior margin of the right parotid gland. IMPRESSION: The palpable abnormality corresponds to a benign-appearing cyst within the inferior margin of the right parotid gland measuring up to 1.4 cm. Electronically Signed   By: Jolaine Click M.D.   On: 02/28/2016 16:07   Dg Chest Portable 1 View  Result Date: 03/20/2016 CLINICAL DATA:  Hypotension.  Fall yesterday.  Weakness. EXAM: PORTABLE CHEST 1 VIEW COMPARISON:  05/01/2015 chest radiograph. FINDINGS: Stable configuration of 2 lead left subclavian pacemaker. Stable cardiomediastinal silhouette with normal heart size and aortic atherosclerosis. No pneumothorax. No pleural effusion. Lungs appear clear, with no acute consolidative airspace disease and no pulmonary edema. IMPRESSION: 1. No active disease in the chest . 2. Aortic atherosclerosis. Electronically Signed   By: Delbert Phenix M.D.   On: 02/28/2016 10:44    Cardiac Studies   [id number] 02/29/16: Study Conclusions  - Left ventricle: ABnormal septal motion inferobasal hypokinesis   The cavity size was normal. Wall thickness was normal. Systolic   function was mildly reduced. The estimated ejection fraction was   in the range of 45% to 50%. - Left atrium: The atrium was mildly dilated. - Atrial septum: No defect or patent foramen ovale was identified.   Patient Profile     76 yo male with PMH of CHB s/p St Jude dual chamber, GERD, HTN, HLD, NIDDM who presented to the ED after a fall with hypotension, dehydration and weakness.   Assessment & Plan    1. Elevated Trop: Peaked at 0.31>>0.19. Denies any chest pain, dyspnea, palpitations, lightheadedness prior to falling. Did remain on the floor for 12 hours, and has a CK total of 996. Trop be mildly elevated in the setting of rhabdomyolysis from his fall. Has not had an ischemic work up in the past, but does have family hx of CAD, HTN, former smoker, HLD , and DM. EKG did have new TWI noted this  admission. The presentation is not compatible with ACS in my opinion. -- Echo Demonstrates regional wall motion abnormality. Needs ischemic work up to assess ischemic risk.  2. Fall/Syncope?: States he remember falling, but events seem alittle unclear. Work up was unremarkable on first visit to the ED, but presented back with dehydration and hypotension within 24 hours. Does have hx of CHB s/p PPM and AF. On  Xarelto, but no reports of bleeding and Hgb stable. -- device interrogation shows he has been in AF since June. Rates mostly in the 70s, but has had some episodes of tachycardia. May need to consider DCCV pending results of echo. States he has been compliant with Xarelto prior to admission.    3. Hypotension/dehydration: Reports decreased oral intake over the past 2 days prior to admission. Was given IV fluids in the ED with improvement, but blood pressure remains soft but tolerated the addition of low dose BB. Cr elevated on admission, but now improved to 1.22.   4. PAF/ CHB s/p PPM: AF noted on EKG, rate on telemetry is generally controlled but some tachy rates noted. Does not appear to be on rate controlling medication at home. Has been started on low dose BB by primary team. On Xarelto. Last interrogation 02/16/16 76% AT/AF with some episodes of AF RVR.   5. Weakness: Reports ongoing progressive weakness over the past couple of months. Working with PT, recommending HH PT.   6. HLD: on statin  7. NIDDM: on SSI, last Hgb A1c 6.8  Signed, Laverda PageLindsay Roberts, NP  02/29/2016, 8:06 AM   The patient has been seen in conjunction with Laverda PageLindsay Roberts, NP-C. All aspects of care have been considered and discussed. The patient has been personally interviewed, examined, and all clinical data has been reviewed.   Elevated troponin, possible syncope, regional wall motion abnormality on echocardiogram, and atrial fibrillation predominantly since June in a patient with permanent pacemaker.  Plan  myocardial perfusion imaging to assess ischemic burden.  Consider electrical cardioversion and antiarrhythmic therapy to maintain sinus rhythm given relative hypotension. Rate control will be difficult given the low blood pressure. Consider cardioversion early next week. Antiarrhythmic therapy with amiodarone added today and hopefully will help control rate.  Patient advocates compliance with outpatient Xarelto therapy.  Weakness and difficulty with ambulation as well as generalized failure to thrive is concerning and probably not primarily cardiac. Additional consideration should be endocrine abnormality, mitotic process, etc.

## 2016-02-29 NOTE — Progress Notes (Signed)
  Date: 02/29/2016  Patient name: KVON MCILHENNY  Medical record number: [id number]  Date of birth: Feb 22, 1941   I have personally seen and evaluated Mr. Baeten and the plan of care was discussed with the house staff. Please see Dr. Durenda Age note for complete details. I concur with their findings with the following additions/corrections:   For his hypotension, reviewed cardiology note and agree that ischemic disease does not completely explain profound weakness, etc.  I am concerned for a possible inflammatory disorder or endocrinopathy.  Would add AM cortisol, trend CK, continue IVF (ordered) as it is not clear how much he is taking in and we have not shown that his CK is continuing to trend down.  Also consider ESR/CRP.  If these are normal, inflammatory process unlikely.  Agree with halving the metoprolol.  He will also be started on amiodarone.  He has a normal TSH and mild elevation of AST.    Sid Falcon, MD 02/29/2016, 2:49 PM

## 2016-02-29 NOTE — Progress Notes (Signed)
  Echocardiogram 2D Echocardiogram with Definity has been performed.  San[place]in, Kery Haltiwanger 02/29/2016, 10:31 AM

## 2016-03-01 ENCOUNTER — Inpatient Hospital Stay (HOSPITAL_COMMUNITY): Payer: Medicare HMO

## 2016-03-01 DIAGNOSIS — R55 Syncope and collapse: Secondary | ICD-10-CM

## 2016-03-01 LAB — BASIC METABOLIC PANEL
ANION GAP: 7 (ref 5–15)
BUN: 8 mg/dL (ref 6–20)
CHLORIDE: 107 mmol/L (ref 101–111)
CO2: 23 mmol/L (ref 22–32)
Calcium: 7.6 mg/dL — ABNORMAL LOW (ref 8.9–10.3)
Creatinine, Ser: 1.05 mg/dL (ref 0.61–1.24)
GFR calc Af Amer: >60 mL/min (ref >60)
GFR calc non Af Amer: >60 mL/min (ref >60)
GLUCOSE: 89 mg/dL (ref 65–99)
POTASSIUM: 3.6 mmol/L (ref 3.5–5.1)
Sodium: 137 mmol/L (ref 135–145)

## 2016-03-01 LAB — GLUCOSE, CAPILLARY
GLUCOSE-CAPILLARY: 101 mg/dL — AB (ref 65–99)
GLUCOSE-CAPILLARY: 88 mg/dL (ref 65–99)
GLUCOSE-CAPILLARY: 112 mg/dL — AB (ref 65–99)
GLUCOSE-CAPILLARY: 116 mg/dL — AB (ref 65–99)

## 2016-03-01 LAB — CK: Total CK: 482 U/L — ABNORMAL HIGH (ref 49–397)

## 2016-03-01 LAB — MAGNESIUM: Magnesium: 1.3 mg/dL — ABNORMAL LOW (ref 1.7–2.4)

## 2016-03-01 LAB — NM MYOCAR MULTI W/SPECT W/WALL MOTION / EF: CHL CUP RESTING HR STRESS: 97 {beats}/min

## 2016-03-01 LAB — CORTISOL-AM, BLOOD: CORTISOL - AM: 22.1 ug/dL (ref 6.7–22.6)

## 2016-03-01 LAB — C-REACTIVE PROTEIN: CRP: 3.8 mg/dL — ABNORMAL HIGH (ref <1.0)

## 2016-03-01 LAB — PHOSPHORUS: Phosphorus: 2.3 mg/dL — ABNORMAL LOW (ref 2.5–4.6)

## 2016-03-01 LAB — SEDIMENTATION RATE: Sed Rate: 26 mm/hr — ABNORMAL HIGH (ref 0–16)

## 2016-03-01 MED ORDER — TECHNETIUM TC 99M TETROFOSMIN IV KIT
30.0000 | PACK | Freq: Once | INTRAVENOUS | Status: AC | PRN
  Administered 2016-03-01: 30 via INTRAVENOUS

## 2016-03-01 MED ORDER — REGADENOSON 0.4 MG/5ML IV SOLN
0.4000 mg | Freq: Once | INTRAVENOUS | Status: AC
  Administered 2016-03-01: 0.4 mg via INTRAVENOUS
  Filled 2016-03-01: qty 5

## 2016-03-01 MED ORDER — SODIUM CHLORIDE 0.9 % IV SOLN
INTRAVENOUS | Status: AC
  Administered 2016-03-01: 13:00:00 via INTRAVENOUS

## 2016-03-01 MED ORDER — MAGNESIUM OXIDE 400 (241.3 MG) MG PO TABS
400.0000 mg | ORAL_TABLET | Freq: Two times a day (BID) | ORAL | Status: AC
  Administered 2016-03-01 (×2): 400 mg via ORAL
  Filled 2016-03-01 (×2): qty 1

## 2016-03-01 MED ORDER — TECHNETIUM TC 99M TETROFOSMIN IV KIT
10.0000 | PACK | Freq: Once | INTRAVENOUS | Status: AC | PRN
  Administered 2016-03-01: 10 via INTRAVENOUS

## 2016-03-01 MED ORDER — REGADENOSON 0.4 MG/5ML IV SOLN
INTRAVENOUS | Status: AC
  Filled 2016-03-01: qty 5

## 2016-03-01 NOTE — Progress Notes (Signed)
Medicine attending: I examined this patient today together with resident physician Dr. Burgess Estelle and I concur with his evaluation and management plan which we discussed together. 76 year old hypertensive, diabetic, with paroxysmal atrial fibrillation, status post permanent pacemaker for third-degree heart block, admitted following a fall at home with subsequent findings of profound weakness, hypotension, and mild dehydration. Serum troponin levels elevated but no chest pain on presentation. The patient had a Myoview stress test this morning. This was read as intermediate risk. There was a large fixed apical septal defect. No definite inducible or reversible ischemia. Septal dyskinesis noted. Left ventricular ejection fraction estimated at 37%. The patient experienced no chest pain during the procedure. Physical exam is stable at this time. Still with intermittent hypotension. Current blood pressure 133/92. Random a.m. cortisol level normal at 22.1 ESR borderline elevated at 26 mm CK enzyme elevated at time of admission has fallen steadily now down from 1000 to 482. Renal function has improved back to his baseline with hydration. BUN 8, creatinine 1.05 today. Final recommendations by cardiology after further review of the stress test pending.

## 2016-03-01 NOTE — Progress Notes (Signed)
Patient Name: Darin LimboJimmy A Grassia Date of Encounter: 03/01/2016  Primary Cardiologist: Dr. Renee Rivalaylor  Hospital Problem List     Active Problems:   Diabetes mellitus type 2, noninsulin dependent (HCC)   Essential hypertension   PAF (paroxysmal atrial fibrillation) (HCC)   Dehydration   Elevated CK   Elevated serum creatinine   Elevated troponin   Neck mass   Other fatigue   Subjective   Denies CP or dyspea  Inpatient Medications    Scheduled Meds: . amiodarone  200 mg Oral BID  . diclofenac sodium  2 g Topical QID  . feeding supplement (ENSURE ENLIVE)  237 mL Oral BID BM  . fluticasone  2 spray Each Nare Daily  . metoprolol succinate  12.5 mg Oral Daily  . pantoprazole  40 mg Oral Daily  . pravastatin  40 mg Oral Daily  . regadenoson      . rivaroxaban  20 mg Oral Q supper  . senna  1 tablet Oral BID  . sodium chloride flush  3 mL Intravenous Q12H   Continuous Infusions:  PRN Meds: acetaminophen **OR** acetaminophen, Melatonin, promethazine   Vital Signs    Vitals:   03/01/16 0924 03/01/16 0931 03/01/16 0933 03/01/16 1140  BP: (!) 102/57 130/68 114/62 (!) 133/92  Pulse:  (!) 116 (!) 121 (!) 114  Resp:    18  Temp:    98 F (36.7 C)  TempSrc:      SpO2:    100%  Weight:      Height:        Intake/Output Summary (Last 24 hours) at 03/01/16 1203 Last data filed at 03/01/16 0600  Gross per 24 hour  Intake          1360.83 ml  Output              725 ml  Net           635.83 ml   Filed Weights   02/28/16 0449 02/29/16 0455 03/01/16 0423  Weight: 215 lb 4.8 oz (97.7 kg) 223 lb 4.8 oz (101.3 kg) 222 lb 6.4 oz (100.9 kg)    Physical Exam    GEN: Well nourished, well developed, in no acute distress.  HEENT: Grossly normal.  Neck: Supple Cardiac: Irreg Irreg Respiratory:  CTA GI: Soft, nontender, nondistended. MS: no deformity or atrophy. Skin: warm and dry, no rash. Neuro:  Strength and sensation are intact. Psych: AAOx3.  Normal affect.  Labs      CBC  Recent Labs  02/28/16 0428  WBC 8.8  HGB 12.8*  HCT 38.3*  MCV 92.5  PLT 177   Basic Metabolic Panel  Recent Labs  02/29/16 0345 03/01/16 0543  NA 137 137  K 3.6 3.6  CL 110 107  CO2 19* 23  GLUCOSE 81 89  BUN 13 8  CREATININE 1.22 1.05  CALCIUM 7.2* 7.6*  MG  --  1.3*  PHOS 1.8* 2.3*   Liver Function Tests  Recent Labs  02/28/16 0428  AST 46*  ALT 17  ALKPHOS 44  BILITOT 1.3*  PROT 5.0*  ALBUMIN 2.8*  Cardiac Enzymes  Recent Labs  03/22/2016 1705 03/05/2016 2213 02/28/16 0428 03/01/16 0543  CKTOTAL  --   --  996* 482*  TROPONINI 0.23* 0.19*  --   --    Hemoglobin A1C  Recent Labs  02/29/2016 1705  HGBA1C 5.4   Thyroid Function Tests  Recent Labs  03/16/2016 1705  TSH 1.035  Telemetry    AF with intermittent Vpacing rate mildly elevated - Personally Reviewed   Radiology    US Soft Tissue Head And Neck  Result Date: 02/28/2016 CLINICAL DATA:  Neck mass EXAM: ULTRASOUND OF HEAD/NECK SOFT TISSUES TECHNIQUE: Ultrasound examination of the head and neck soft tissues was performed in the area of clinical concern. COMPARISON:  None. FINDINGS: The palpable abnormality corresponds to a a 1.4 x 0.9 x 0.8 cm benign-appearing cyst within the deep subcutaneous tissue at the inferior margin of the right parotid gland. IMPRESSION: The palpable abnormality corresponds to a benign-appearing cyst within the inferior margin of the right parotid gland measuring up to 1.4 cm. Electronically Signed   By: Jolaine Click M.D.   On: 02/28/2016 16:07    Cardiac Studies   [id number] 02/29/16: Study Conclusions  - Left ventricle: ABnormal septal motion inferobasal hypokinesis   The cavity size was normal. Wall thickness was normal. Systolic   function was mildly reduced. The estimated ejection fraction was   in the range of 45% to 50%. - Left atrium: The atrium was mildly dilated. - Atrial septum: No defect or patent foramen ovale was  identified.   Patient Profile     76 yo male with PMH of CHB s/p St Jude dual chamber, GERD, HTN, HLD, NIDDM who presented to the ED after a fall with hypotension, dehydration and weakness.   Assessment & Plan    1. Elevated Trop: Peaked at 0.31>>0.19. Denies any chest pain. Did remain on the floor for 12 hours, and had a CK total of 996. Trop be mildly elevated in the setting of rhabdomyolysis from his fall. Troponin not c/w ACS. -- Echo Demonstrates regional wall motion abnormality. Await results of nuclear study; if negative, plan medical therapy.  2. Fall/Syncope?:  -- device interrogation shows he has been in AF since June. Rate controlled for the most part, but has had some episodes of tachycardia. States he has been compliant with Xarelto prior to admission.  Amiodarone added by Dr Katrinka Blazing. Plan DCCV next week if nuclear study negative.  3. PAF/ CHB s/p PPM: continue metoprolol and amiodarone. On Xarelto. Last interrogation 02/16/16 76% AT/AF with some episodes of AF RVR. Plan DCCV next week if nuclear study negative.  Signed, Olga Millers, MD  03/01/2016, 12:03 PM

## 2016-03-01 NOTE — Progress Notes (Addendum)
 Subjective:  Patient underwent a myoview test this morning as echo demonstrated regional wall motion abnormality.. Results showing intermediate risk. He is feeling well. Blood pressures have improved than before.      Objective: Vital signs in last 24 hours: Vitals:   03/01/16 0924 03/01/16 0931 03/01/16 0933 03/01/16 1140  BP: (!) 102/57 130/68 114/62 (!) 133/92  Pulse:  (!) 116 (!) 121 (!) 114  Resp:    18  Temp:    98 F (36.7 C)  TempSrc:      SpO2:    100%  Weight:      Height:        Intake/Output Summary (Last 24 hours) at 03/01/16 1230 Last data filed at 03/01/16 0600  Gross per 24 hour  Intake          1360.83 ml  Output              725 ml  Net           635.83 ml   Physical Exam Physical Exam  Constitutional: He appears well-developed. No distress.  Cardiovascular: An irregularly irregular rhythm present.  Distant heart sounds   Pulmonary/Chest: Effort normal and breath sounds normal.  Abdominal: Soft. He exhibits no distension. There is no tenderness.  Musculoskeletal: Normal range of motion. He exhibits deformity (left index finger missing). He exhibits no edema.  Skin: Skin is warm and dry.  Hyperpigmentation over lower legs  Vitals reviewed.  Labs / Imaging / Procedures: CBC Latest Ref Rng & Units 02/28/2016 03/02/2016 03/24/2016  WBC 4.0 - 10.5 K/uL 8.8 - 11.4(H)  Hemoglobin 13.0 - 17.0 g/dL 12.8(L) 13.6 13.9  Hematocrit 39.0 - 52.0 % 38.3(L) 40.0 41.2  Platelets 150 - 400 K/uL 177 - 200   BMP Latest Ref Rng & Units 03/01/2016 02/29/2016 02/28/2016  Glucose 65 - 99 mg/dL 89 81 77  BUN 6 - 20 mg/dL 8 13 16  Creatinine 0.61 - 1.24 mg/dL 1.05 1.22 1.32(H)  Sodium 135 - 145 mmol/L 137 137 136  Potassium 3.5 - 5.1 mmol/L 3.6 3.6 3.6  Chloride 101 - 111 mmol/L 107 110 108  CO2 22 - 32 mmol/L 23 19(L) 22  Calcium 8.9 - 10.3 mg/dL 7.6(L) 7.2(L) 7.6(L)   No results found. TTE 02/29/2016  Study Conclusions  - Left ventricle: ABnormal septal motion  inferobasal hypokinesis   The cavity size was normal. Wall thickness was normal. Systolic   function was mildly reduced. The estimated ejection fraction was   in the range of 45% to 50%. - Left atrium: The atrium was mildly dilated. - Atrial septum: No defect or patent foramen ovale was identified.  03/01/2016 NM Myocar Multi IMPRESSION: 1. Moderately large fixed apical septal defect compatible with scar versus remote infarct. No definite inducible ischemia with pharmacologic stress.  2. Septal dyskinesis.  3. Left ventricular ejection fraction 37%  4. Non invasive risk stratification*: Intermediate  Assessment/Plan: 75 yo man with history of DM, HTN, HLD, PAF, history of CHB s/p pacemaker 04/2015 who presented on 1/3 with hypotension, generalized weakness, and recent fall at home.  Fall, vs syncope - unclear if mechanical fall vs syncope, coincided with profound weakness and remained on floor for 12 hours, having persistent weakness and hypotension, also with poor po intake. History of CHB with pacemaker and PAF, on Xarelto - CT head, c-spine, hip XR with no acute abnml/fracture. TTE shows EF 45-50%. NM myocar shows intermediate risk.  Has elevated ESR and CRP- may have   underlying inflammatory condition- may need further evaluation.   - Appreciating cardiology recs, may undergo DCCV - Telemetry - PT OT eval/treat - recommending HH PTOT and intermittent supervision - Tylenol PRN for aches/pains - Voltaren gel PRN for aches/pains  Elevated troponins on admission: underwent myoview test today showing septal dyskinesis and EF 37% and intermediate risk. -awaiting further cardiology recs   Hypotension - has improved after decreasing the metoprolol. AM cortisol has been negative.   -continue to monitor  HypoMag:  -repleted Mag oxide  HypoPhos -Given some signs for rhabdo- holding off on repleting phos- His CK is downtrending though. Recheck tomorrow   PAF and CHB s/p pacemaker,  CHADSVASC score 7, in AF with normal rates. Tele today shows AF with intermittent Vpacing rate mildly elevated .   - Metoprolol XR 12.5 mg QD - Xarelto 20 mg daily - Telemetry  HFrEF, unclear if acute vs chronic, LVEF 45-50% with inferobasilar hypokinesis. 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 - On beta blocker, hold of on starting ACE inhibitor given ongoing hypotension -underwent myoview today    AKI, mild, resolving, creatinine 1.2 from 1.50 on admission - improving with IV fluids and hydration   GERD - continue home Protonix HLD - continue home Pravachol  Dispo: Anticipated discharge in approximately 1-2 days.  LOS: 2 days   Burgess Estelle, MD 03/01/2016, 12:30 PM Pager: [phone]

## 2016-03-01 NOTE — Progress Notes (Signed)
Discussed "intermediate" results of Myoview with Dr Jens Somrenshaw. He will review in am and make further recommendations then.  Corine ShelterLUKE Mafalda Mcginniss PA-C 03/01/2016 3:07 PM

## 2016-03-01 NOTE — Progress Notes (Signed)
Lexiscan tolerated well- final report pending.   Corine ShelterLUKE Lempi Edwin PA-C 03/01/2016 9:35 AM

## 2016-03-01 NOTE — Progress Notes (Signed)
Subjective: Darin Gonzalez is doing well today without any acute changes overnight.  He was disappointed about not being able to walk yesterday due to upcoming stress test.   Objective: Vital signs in last 24 hours: Vitals:   03/01/16 0903 03/01/16 0924 03/01/16 0931 03/01/16 0933  BP: (!) 95/51 (!) 102/57 130/68 114/62  Pulse: (!) 109  (!) 116 (!) 121  Resp:      Temp:      TempSrc:      SpO2:      Weight:      Height:       Weight change: -0.408 kg (-14.4 oz)  Intake/Output Summary (Last 24 hours) at 03/01/16 1130 Last data filed at 03/01/16 0600  Gross per 24 hour  Intake          1360.83 ml  Output              725 ml  Net           635.83 ml    Physical Exam  Gen: Well-appearing in NAD. HEENT: NCAT. Moist mucous membranes.  CV: Distant heart sounds. RRR. Pulm:  CTAB, no wheezes or rhonchi. Abdomen: Soft, non-tender. Normal BS appreciated.  Extremities: Ecchymosis on left forearm. No LE edema.  Lab Results: BMP Latest Ref Rng & Units 03/01/2016 02/29/2016 02/28/2016  Glucose 65 - 99 mg/dL 89 81 77  BUN 6 - 20 mg/dL '8 13 16  ' Creatinine 0.61 - 1.24 mg/dL 1.05 1.22 1.32(H)  Sodium 135 - 145 mmol/L 137 137 136  Potassium 3.5 - 5.1 mmol/L 3.6 3.6 3.6  Chloride 101 - 111 mmol/L 107 110 108  CO2 22 - 32 mmol/L 23 19(L) 22  Calcium 8.9 - 10.3 mg/dL 7.6(L) 7.2(L) 7.6(L)  1/6 Mg -- 1.3 Phos -- 2.3  1/6 ESR - 26 CRP - 3.8  CK -- 996 (1/4) >> 482 (1/6)   Micro Results: Recent Results (from the past 240 hour(s))  Urine culture     Status: None   Collection Time: 03/11/2016 12:30 PM  Result Value Ref Range Status   Specimen Description URINE, RANDOM  Final   Special Requests NONE  Final   Culture NO GROWTH  Final   Report Status 02/28/2016 FINAL  Final   Studies/Results: US Soft Tissue Head And Neck  Result Date: 02/28/2016 CLINICAL DATA:  Neck mass EXAM: ULTRASOUND OF HEAD/NECK SOFT TISSUES TECHNIQUE: Ultrasound examination of the head and neck soft tissues was  performed in the area of clinical concern. COMPARISON:  None. FINDINGS: The palpable abnormality corresponds to a a 1.4 x 0.9 x 0.8 cm benign-appearing cyst within the deep subcutaneous tissue at the inferior margin of the right parotid gland. IMPRESSION: The palpable abnormality corresponds to a benign-appearing cyst within the inferior margin of the right parotid gland measuring up to 1.4 cm. Electronically Signed   By: Marybelle Killings M.D.   On: 02/28/2016 16:07    Echocardiogram 02/29/16  Left ventricle:  Abnormal septal motion inferobasal hypokinesis The cavity size was normal. Wall thickness was normal. Systolic function was mildly reduced. The estimated ejection fraction was in the range of 45% to 50%.  03/01/16 NM Myocar multi IMPRESSION: 1. Moderately large fixed apical septal defect compatible with scar versus remote infarct. No definite inducible ischemia with pharmacologic stress.  2. Septal dyskinesis.  3. Left ventricular ejection fraction 37%  4. Non invasive risk stratification*: Intermediate   Medications: I have reviewed the patient's current medications. Scheduled Meds: . amiodarone  200  mg Oral BID  . diclofenac sodium  2 g Topical QID  . feeding supplement (ENSURE ENLIVE)  237 mL Oral BID BM  . fluticasone  2 spray Each Nare Daily  . metoprolol succinate  12.5 mg Oral Daily  . pantoprazole  40 mg Oral Daily  . pravastatin  40 mg Oral Daily  . regadenoson      . rivaroxaban  20 mg Oral Q supper  . senna  1 tablet Oral BID  . sodium chloride flush  3 mL Intravenous Q12H   Continuous Infusions: PRN Meds:.acetaminophen **OR** acetaminophen, Melatonin, promethazine Assessment/Plan: Darin Gonzalez is a 76 yo man with h/o PAF, CHB s/p pacemaker 04/2015,  DM, HTN, and HLD who presented on 1/3 with hypotension, generalized weakness, and recent fall at home and was admitted for dehydration and cardiac ischemia.  Active Problems:   Diabetes mellitus type 2, noninsulin  dependent (HCC)   Essential hypertension   PAF (paroxysmal atrial fibrillation) (HCC)   Dehydration   Elevated CK   Elevated serum creatinine   Elevated troponin   Neck mass   Other fatigue  Hypotension and dehydration, with earlier presentation for fall: Bp was initially lower than baseline at ~80/50. Negative testing for orthostatic hypotension on 03/10/2016. AM cortisol 01/06 WNL.  01/06 bp normalized at ~130/70. - will continue to monitor  Elevated troponins:0.31-->0.23-->0.19. Likely demand ischemia/ afib precipitation. Pt has no chest pain.   Myocardial perfusion testing revealed EF 37% with intermediate noninvasive risk stratification. - ASA 358m - Will follow cardiology recs on next steps  HFrEF: NM study 1/6 revealed EF 37% with septal dyskinesis.  Strong family history of ischemic HF - on metoprolol XL 12.5 mg - will consider ACE-I if bp continues to improve - will follow cardiology recs  Afib: Pt is on Xarelto and new low dose metoprolol. Pacemaker interrogation showed he had been in AF since June. CHAD2VASC score of at least 4. 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 U/s revealed benign appearing cystic mass in parotid gland.   GERD: - continue protonix   DM: Last A1c in March 2017 being 6.8. Pt on metformin at home. -SSI-S TID WC  HTN: BP normotensive -metoprolol XL 12.5   AKI: Resolved with IV fluids. Serum Cr today 1.06  Elevated CK / Hematuria- Had fall 3 days ago where he was on the ground for 12 hrs. Initially CK at 996 and 6-30 RBC/hpf on UA 1/4. On repeat 1/5, UA small Hgb and 0-5 RBC/hpf.  CK downtrending to 486 on 1/6. ESR and CRP elevated 1/6.  Will consider alternative causes for weakness and elevated CK / inflammatory markers including polymyositis and PMR.  - recheck CK  tomorrow to follow trend  Hypophosphatemia: Phos 1/6 at 2.3 -- replete phos - 250 mg    This is a MCareers information officerNote.  The care of the patient was discussed with Dr. SLorie Phenixand the assessment and plan formulated with their assistance.  Please see their attached note for official documentation of the daily encounter.   LOS: 2 days   DDoug Sou Medical Student 03/01/2016, 11:30 AM

## 2016-03-02 DIAGNOSIS — I481 Persistent atrial fibrillation: Principal | ICD-10-CM

## 2016-03-02 LAB — BASIC METABOLIC PANEL
Anion gap: 8 (ref 5–15)
Anion gap: 8 (ref 5–15)
BUN: 10 mg/dL (ref 6–20)
BUN: 12 mg/dL (ref 6–20)
CHLORIDE: 104 mmol/L (ref 101–111)
CO2: 22 mmol/L (ref 22–32)
CO2: 20 mmol/L — AB (ref 22–32)
CREATININE: 1.27 mg/dL — AB (ref 0.61–1.24)
CREATININE: 1.31 mg/dL — AB (ref 0.61–1.24)
Calcium: 7.2 mg/dL — ABNORMAL LOW (ref 8.9–10.3)
Calcium: 7.3 mg/dL — ABNORMAL LOW (ref 8.9–10.3)
Chloride: 105 mmol/L (ref 101–111)
GFR calc Af Amer: 60 mL/min — ABNORMAL LOW (ref >60)
GFR calc non Af Amer: 54 mL/min — ABNORMAL LOW (ref >60)
GFR calc non Af Amer: 52 mL/min — ABNORMAL LOW (ref >60)
Glucose, Bld: 95 mg/dL (ref 65–99)
Glucose, Bld: 127 mg/dL — ABNORMAL HIGH (ref 65–99)
Potassium: 3.8 mmol/L (ref 3.5–5.1)
Potassium: 3.9 mmol/L (ref 3.5–5.1)
SODIUM: 132 mmol/L — AB (ref 135–145)
Sodium: 135 mmol/L (ref 135–145)

## 2016-03-02 LAB — GLUCOSE, CAPILLARY: Glucose-Capillary: 89 mg/dL (ref 65–99)

## 2016-03-02 LAB — PHOSPHORUS: PHOSPHORUS: 2.3 mg/dL — AB (ref 2.5–4.6)

## 2016-03-02 LAB — MAGNESIUM: Magnesium: 1.2 mg/dL — ABNORMAL LOW (ref 1.7–2.4)

## 2016-03-02 MED ORDER — MAGNESIUM SULFATE 50 % IJ SOLN
3.0000 g | Freq: Once | INTRAVENOUS | Status: AC
  Administered 2016-03-02: 3 g via INTRAVENOUS
  Filled 2016-03-02: qty 6

## 2016-03-02 MED ORDER — AMIODARONE HCL 200 MG PO TABS
400.0000 mg | ORAL_TABLET | Freq: Two times a day (BID) | ORAL | Status: DC
  Administered 2016-03-02 – 2016-03-04 (×4): 400 mg via ORAL
  Filled 2016-03-02 (×5): qty 2

## 2016-03-02 NOTE — Progress Notes (Signed)
Subjective: [name] is feeling okay this morning. Reports persistent fatigue. Has not been walking in the hall due to leg weakness. He reports improved dizziness with sitting up and standing but still present.   Telemetry reviewed and showing persistent intermittent episodes of Afib RVR to HR 120s-140s every few minutes and an episode of polymorphic VTach yesterday afternoon. Repleting magnesium IV this morning  Objective: Vital signs in last 24 hours: Vitals:   03/01/16 0933 03/01/16 1140 03/01/16 1945 03/02/16 0639  BP: 114/62 (!) 133/92 (!) 103/53 111/78  Pulse: (!) 121 (!) 114 93 81  Resp:  18  20  Temp:  98 F (36.7 C) 98.4 F (36.9 C) 97.9 F (36.6 C)  TempSrc:   Oral Oral  SpO2:  100% 96% 95%  Weight:    224 lb 8 oz (101.8 kg)  Height:        Intake/Output Summary (Last 24 hours) at 03/02/16 0730 Last data filed at 03/02/16 0644  Gross per 24 hour  Intake              950 ml  Output             1100 ml  Net             -150 ml   Physical Exam Physical Exam  Constitutional: He is oriented to person, place, and time. He appears well-developed. No distress.  Cardiovascular: An irregularly irregular rhythm present.  Distant heart sounds   Pulmonary/Chest: Effort normal and breath sounds normal.  Abdominal: Soft. He exhibits no distension. There is no tenderness.  Musculoskeletal: Normal range of motion. He exhibits no edema.  Neurological: He is alert and oriented to person, place, and time. No sensory deficit. He exhibits normal muscle tone.  4/5 proximal strength in BLEs, otherwise 5/5 throughout  Skin: Skin is warm and dry.  Hyperpigmentation over lower legs  Vitals reviewed.  Labs / Imaging / Procedures: CBC Latest Ref Rng & Units 02/28/2016 03/23/2016 03/19/2016  WBC 4.0 - 10.5 K/uL 8.8 - 11.4(H)  Hemoglobin 13.0 - 17.0 g/dL 12.8(L) 13.6 13.9  Hematocrit 39.0 - 52.0 % 38.3(L) 40.0 41.2  Platelets 150 - 400 K/uL 177 - 200   BMP Latest Ref Rng & Units  03/02/2016 03/01/2016 02/29/2016  Glucose 65 - 99 mg/dL 95 89 81  BUN 6 - 20 mg/dL _0 Creatinine 0.61 - 1.24 mg/dL 1.27(H) 1.05 1.22  Sodium 135 - 145 mmol/L 135 137 137  Potassium 3.5 - 5.1 mmol/L 3.8 3.6 3.6  Chloride 101 - 111 mmol/L 105 107 110  CO2 22 - 32 mmol/L 22 23 19(L)  Calcium 8.9 - 10.3 mg/dL 7.2(L) 7.6(L) 7.2(L)   Nm Myocar Multi W/spect W/wall Motion / Ef  Result Date: 03/01/2016 CLINICAL DATA:  Diabetes, atrial fibrillation, elevated troponin, syncope EXAM: MYOCARDIAL IMAGING WITH SPECT (REST AND PHARMACOLOGIC-STRESS) GATED LEFT VENTRICULAR WALL MOTION STUDY LEFT VENTRICULAR EJECTION FRACTION TECHNIQUE: Standard myocardial SPECT imaging was performed after resting intravenous injection of 10 mCi Tc-27mtetrofosmin. Subsequently, intravenous infusion of Lexiscan was performed under the supervision of the Cardiology staff. At peak effect of the drug, 30 mCi Tc-969metrofosmin was injected intravenously and standard myocardial SPECT imaging was performed. Quantitative gated imaging was also performed to evaluate left ventricular wall motion, and estimate left ventricular ejection fraction. COMPARISON:  None. FINDINGS: Perfusion: Moderately large fixed apical septal defect. No definite inducible or reversible ischemia with pharmacologic stress. Wall Motion: Septal dyskinesis noted.  No LV  chamber dilatation. Left Ventricular Ejection Fraction: 37 % End diastolic volume 65 ml End systolic volume 41 ml IMPRESSION: 1. Moderately large fixed apical septal defect compatible with scar versus remote infarct. No definite inducible ischemia with pharmacologic stress. 2. Septal dyskinesis. 3. Left ventricular ejection fraction 37% 4. Non invasive risk stratification*: Intermediate *2012 Appropriate Use Criteria for Coronary Revascularization Focused Update: J Am Coll Cardiol. 4098;11(9):147-829. http://content.[email or web address].aspx?articleid=[email or web address] Electronically Signed   By: Darin Mages.  Gonzalez M.D.   On:  03/01/2016 12:35    Assessment/Plan: 76 yo man with history of DM, HTN, HLD, PAF, history of CHB s/p pacemaker 04/2015 who presented on 1/3 with hypotension, generalized weakness, and recent fall at home.  Fall, vs syncope - unclear if mechanical fall vs syncope, coincided with profound weakness and remained on floor for 12 hours, having persistent weakness and hypotension, also with poor po intake. History of CHB with pacemaker and PAF, on Xarelto - CT head, c-spine, hip XR with no acute abnml/fracture. TTE shows EF 45-50%. NM myocar shows intermediate risk.  Mild elevated ESR and CRP - may have underlying inflammatory condition - may need further evaluation. Persistent poorly-controled Atrial fibrillation with episodes of RVR and hypomagnesemia could be contributing to fatigue as well.  - Telemetry - PT OT eval/treat - recommending HH PTOT and intermittent supervision - Tylenol PRN for aches/pains - Voltaren gel PRN for aches/pains  PAF and CHB s/p pacemaker, CHADSVASC score 7, in AF. Started on Metoprolol and Amiodarone this admission. Tele today shows AF with intermittent RVR to 140s.  - Appreciate cardiology recommendations - Amiodarone 490m BID for 2 weeks, then 200 mg daily thereafter (~03/16/16) - TEE/DCCV tomorrow AM - Metoprolol XR 12.5 mg QD, defer potential increase to 25 mg given hypotension - Xarelto 20 mg daily - Telemetry  Elevated Troponin,peaked at 0.31 >>0.19. Denied any chest pain, stuck on floor for 12 hours after a fall, resulting in mild rhabdo/elevated CK to 1000, possible source. Also with intermittent AF RVR which may be resulting in troponin leak. - TTE, LVEF 45-50%. NM study with fixed apical defect possibly related to pacing, intermediate risk but no ischemia - Medical therapy per cardiology recs  HFrEF, unclear if acute vs chronic, LVEF 45-50% with inferobasilar hypokinesis found on TTE 1/5. Intermediate risk NM study on 1/6. Had mild troponin elevation to 0.31  on admission. - On beta blocker, hold of on starting ACE inhibitor given ongoing hypotension - Appreciate cardiology recommendations - TEE/DCCV tomorrow AM  Hypotension - has improved after decreasing Metoprolol dose. AM cortisol has been negative.  -continue to monitor  HypoMag, 1.2 - 1.3, persisting despite magnesium oxide oral supplementation, episode of polymorphic VT on 1/6:  - 3g IV magnesium sulfate today -repleted Mag oxide  HypoPhos -Given some signs for rhabdo- holding off on repleting phos- His CK is downtrending though. Recheck tomorrow  AKI, mild, resolving, creatinine 1.2 from 1.50 on admission - improving with IV fluids and hydration  GERD - continue home Protonix HLD - continue home Pravachol  Dispo: Anticipated discharge in approximately 1-2 days.  LOS: 3 days   AAsencion Partridge MD 03/02/2016, 7:30 AM Pager: [phone]

## 2016-03-02 NOTE — Progress Notes (Signed)
Occupational Therapy Treatment Patient Details Name: Darin LimboJimmy A Kataoka MRN: [id number] DOB: [birth year] Today's Date: 03/02/2016    History of present illness Pt adm after fall at home. Pt found to be hypotensive and dehydrated. PMH - DM, HTN, afib, pacer   OT comments  Pt. Reports "feeling terrible all over"  With generalized c/o weakness.  Able to complete approx. 4 steps from recliner to bed and then perform bed mobility with min a.  Will continue to follow acutely for ADLs and/or UE exercises and strengthening for focus of next session.    Follow Up Recommendations  Home health OT;Supervision - Intermittent    Equipment Recommendations  3 in 1 bedside commode    Recommendations for Other Services      Precautions / Restrictions Precautions Precautions: Fall       Mobility Bed Mobility Overal bed mobility: Needs Assistance Bed Mobility: Sit to Sidelying;Rolling Rolling: Supervision       Sit to sidelying: Supervision General bed mobility comments: hob flat, no rails.  pt. able to scoot x 3 towards HOB and then transition into sidelying and bring b les into bed without any physical assistance  Transfers Overall transfer level: Needs assistance Equipment used: Rolling walker (2 wheeled) Transfers: Sit to/from UGI CorporationStand;Stand Pivot Transfers Sit to Stand: Supervision Stand pivot transfers: Min guard       General transfer comment: able to use b hand and ues to push up from arm rests and transition into standing.      Balance                                   ADL Overall ADL's : Needs assistance/impaired                         Toilet Transfer: Minimal assistance;RW;Ambulation   Toileting- Clothing Manipulation and Hygiene: Minimal assistance;Sitting/lateral lean Toileting - Clothing Manipulation Details (indicate cue type and reason): simulated during transfer from recliner to eob     Functional mobility during ADLs: Minimal  assistance;Rolling walker General ADL Comments: pt. states that his dtr. and son assist him regularly and live with and next door to him.  reviewed that he mentioned needing someone to "check in on him" he states "no they are always there for me, im fine".        Vision                     Perception     Praxis      Cognition   Behavior During Therapy: [facility] CenterWFL for tasks assessed/performed;Anxious Overall Cognitive Status: History of cognitive impairments - at baseline Area of Impairment: Memory     Memory: Decreased short-term memory          General Comments: son reports increasing forgetfulness    Extremity/Trunk Assessment               Exercises     Shoulder Instructions       General Comments      Pertinent Vitals/ Pain       Pain Assessment: No/denies pain  Home Living                                          Prior Functioning/Environment  Frequency  Min 2X/week        Progress Toward Goals  OT Goals(current goals can now be found in the care plan section)  Progress towards OT goals: Progressing toward goals     Plan Discharge plan remains appropriate    Co-evaluation                 End of Session Equipment Utilized During Treatment: Gait belt;Rolling walker;Oxygen   Activity Tolerance Patient tolerated treatment well   Patient Left in bed;with call bell/phone within reach;with bed alarm set   Nurse Communication Other (comment) (reviewed pt. states he has no appetite and does not want his lunch)        Time: 1245-1310 OT Time Calculation (min): 25 min  Charges: OT General Charges $OT Visit: 1 Procedure OT Treatments $Self Care/Home Management : 23-37 mins  Robet Leu, COTA/L 03/02/2016, 2:24 PM

## 2016-03-02 NOTE — Progress Notes (Signed)
Patient Name: Darin LimboJimmy A Gonzalez Date of Encounter: 03/02/2016  Primary Cardiologist: Dr. Renee Rivalaylor  Hospital Problem List     Active Problems:   Diabetes mellitus type 2, noninsulin dependent (HCC)   Essential hypertension   PAF (paroxysmal atrial fibrillation) (HCC)   Dehydration   Elevated CK   Elevated serum creatinine   Elevated troponin   Neck mass   Other fatigue   Subjective   Denies CP; mild dyspnea  Inpatient Medications    Scheduled Meds: . amiodarone  200 mg Oral BID  . diclofenac sodium  2 g Topical QID  . feeding supplement (ENSURE ENLIVE)  237 mL Oral BID BM  . fluticasone  2 spray Each Nare Daily  . magnesium sulfate 1 - 4 g bolus IVPB  3 g Intravenous Once  . metoprolol succinate  12.5 mg Oral Daily  . pantoprazole  40 mg Oral Daily  . pravastatin  40 mg Oral Daily  . rivaroxaban  20 mg Oral Q supper  . senna  1 tablet Oral BID  . sodium chloride flush  3 mL Intravenous Q12H   Continuous Infusions:  PRN Meds: acetaminophen **OR** acetaminophen, Melatonin, promethazine   Vital Signs    Vitals:   03/01/16 0933 03/01/16 1140 03/01/16 1945 03/02/16 0639  BP: 114/62 (!) 133/92 (!) 103/53 111/78  Pulse: (!) 121 (!) 114 93 81  Resp:  18  20  Temp:  98 F (36.7 C) 98.4 F (36.9 C) 97.9 F (36.6 C)  TempSrc:   Oral Oral  SpO2:  100% 96% 95%  Weight:    224 lb 8 oz (101.8 kg)  Height:        Intake/Output Summary (Last 24 hours) at 03/02/16 0944 Last data filed at 03/02/16 0644  Gross per 24 hour  Intake              950 ml  Output              900 ml  Net               50 ml   Filed Weights   02/29/16 0455 03/01/16 0423 03/02/16 0639  Weight: 223 lb 4.8 oz (101.3 kg) 222 lb 6.4 oz (100.9 kg) 224 lb 8 oz (101.8 kg)    Physical Exam    GEN: Well nourished, well developed, in no acute distress.  HEENT: Grossly normal.  Neck: Supple Cardiac: Irreg Irreg; no peripheral edema Respiratory:  CTA GI: Soft, nontender, nondistended. MS: no  deformity or atrophy. Skin: warm and dry, no rash. Neuro:  Strength and sensation are intact. Psych: AAOx3.  Normal affect.  Labs    Basic Metabolic Panel  Recent Labs  03/01/16 0543 03/02/16 0233  NA 137 135  K 3.6 3.8  CL 107 105  CO2 23 22  GLUCOSE 89 95  BUN 8 10  CREATININE 1.05 1.27*  CALCIUM 7.6* 7.2*  MG 1.3* 1.2*  PHOS 2.3* 2.3*    Recent Labs  03/01/16 0543  CKTOTAL 482*    Telemetry    AF with intermittent Vpacing rate elevated - Personally Reviewed   Radiology    Nm Myocar Multi W/spect W/wall Motion / Ef  Result Date: 03/01/2016 CLINICAL DATA:  Diabetes, atrial fibrillation, elevated troponin, syncope EXAM: MYOCARDIAL IMAGING WITH SPECT (REST AND PHARMACOLOGIC-STRESS) GATED LEFT VENTRICULAR WALL MOTION STUDY LEFT VENTRICULAR EJECTION FRACTION TECHNIQUE: Standard myocardial SPECT imaging was performed after resting intravenous injection of 10 mCi Tc-3057m tetrofosmin. Subsequently, intravenous infusion  of Lexiscan was performed under the supervision of the Cardiology staff. At peak effect of the drug, 30 mCi Tc-41m tetrofosmin was injected intravenously and standard myocardial SPECT imaging was performed. Quantitative gated imaging was also performed to evaluate left ventricular wall motion, and estimate left ventricular ejection fraction. COMPARISON:  None. FINDINGS: Perfusion: Moderately large fixed apical septal defect. No definite inducible or reversible ischemia with pharmacologic stress. Wall Motion: Septal dyskinesis noted.  No LV chamber dilatation. Left Ventricular Ejection Fraction: 37 % End diastolic volume 65 ml End systolic volume 41 ml IMPRESSION: 1. Moderately large fixed apical septal defect compatible with scar versus remote infarct. No definite inducible ischemia with pharmacologic stress. 2. Septal dyskinesis. 3. Left ventricular ejection fraction 37% 4. Non invasive risk stratification*: Intermediate *2012 Appropriate Use Criteria for Coronary  Revascularization Focused Update: J Am Coll Cardiol. 2012;59(9):857-881. http://[email or web address]?[email or web address] Electronically Signed   By: Judie Petit.  Shick M.D.   On: 03/01/2016 12:35    Cardiac Studies   [id number] 02/29/16: Study Conclusions  - Left ventricle: ABnormal septal motion inferobasal hypokinesis   The cavity size was normal. Wall thickness was normal. Systolic   function was mildly reduced. The estimated ejection fraction was   in the range of 45% to 50%. - Left atrium: The atrium was mildly dilated. - Atrial septum: No defect or patent foramen ovale was identified.   Patient Profile     76 yo male with PMH of CHB s/p [employer] Jude dual chamber, GERD, HTN, HLD, NIDDM who presented to the ED after a fall with hypotension, dehydration and weakness.   Assessment & Plan    1. Elevated Trop: Peaked at 0.31>>0.19. Denies any chest pain. Did remain on the floor for 12 hours, and had a CK total of 996. Trop be mildly elevated in the setting of rhabdomyolysis from his fall. Troponin not c/w ACS. -- Echo Demonstrates EF 45-50. Nuclear study shows fixed apical defect possibly related to pacing; no ischemia; plan medical therapy.  2. Fall/Syncope?:  -- device interrogation shows he has been in AF since June. Rate elevated. Amiodarone added by Dr Katrinka Blazing. Pt's daughter states he has been declining and not clear he has been taking xarelto daily. Will arrange TEE/DCCV in AM if possible. Continue amiodarone 400 BID for 2 weeks and then 200 mg daily thereafter.  3. PAF/ CHB s/p PPM: continue metoprolol and amiodarone. On Xarelto. Last interrogation 02/16/16 76% AT/AF with some episodes of AF RVR. Plan TEE/DCCV as outlined above.  Signed, Olga Millers, MD  03/02/2016, 9:44 AM

## 2016-03-02 NOTE — Progress Notes (Signed)
Medicine attending: Clinical status and ancillary data reviewed in detail with resident physician Dr. Althia FortsAdam Johnson and I concur with his evaluation and management plan. He is still having intermittent rapid heart rates with his atrial fibrillation. Amiodarone dose increased. He had one episode of V. tach yesterday which looks like torsades. Magnesium replacement in progress. TEE and DC cardioversion scheduled for tomorrow. We appreciate ongoing assistance from cardiology.

## 2016-03-03 ENCOUNTER — Inpatient Hospital Stay (HOSPITAL_COMMUNITY): Payer: Medicare HMO

## 2016-03-03 ENCOUNTER — Inpatient Hospital Stay (HOSPITAL_COMMUNITY): Payer: Medicare HMO | Admitting: Certified Registered"

## 2016-03-03 ENCOUNTER — Encounter (HOSPITAL_COMMUNITY): Payer: Self-pay | Admitting: *Deleted

## 2016-03-03 ENCOUNTER — Other Ambulatory Visit: Payer: Self-pay | Admitting: *Deleted

## 2016-03-03 ENCOUNTER — Encounter (HOSPITAL_COMMUNITY): Admission: EM | Disposition: E | Payer: Self-pay | Source: Home / Self Care | Attending: Internal Medicine

## 2016-03-03 DIAGNOSIS — M79609 Pain in unspecified limb: Secondary | ICD-10-CM

## 2016-03-03 DIAGNOSIS — Z950 Presence of cardiac pacemaker: Secondary | ICD-10-CM

## 2016-03-03 DIAGNOSIS — I442 Atrioventricular block, complete: Secondary | ICD-10-CM

## 2016-03-03 DIAGNOSIS — I70221 Atherosclerosis of native arteries of extremities with rest pain, right leg: Secondary | ICD-10-CM

## 2016-03-03 DIAGNOSIS — I959 Hypotension, unspecified: Secondary | ICD-10-CM

## 2016-03-03 DIAGNOSIS — I739 Peripheral vascular disease, unspecified: Secondary | ICD-10-CM

## 2016-03-03 DIAGNOSIS — I4891 Unspecified atrial fibrillation: Secondary | ICD-10-CM

## 2016-03-03 DIAGNOSIS — I4811 Longstanding persistent atrial fibrillation: Secondary | ICD-10-CM

## 2016-03-03 DIAGNOSIS — I9589 Other hypotension: Secondary | ICD-10-CM

## 2016-03-03 DIAGNOSIS — I313 Pericardial effusion (noninflammatory): Secondary | ICD-10-CM

## 2016-03-03 DIAGNOSIS — R55 Syncope and collapse: Secondary | ICD-10-CM

## 2016-03-03 HISTORY — PX: CARDIOVERSION: SHX1299

## 2016-03-03 HISTORY — PX: TEE WITHOUT CARDIOVERSION: SHX5443

## 2016-03-03 LAB — BASIC METABOLIC PANEL
ANION GAP: 9 (ref 5–15)
BUN: 15 mg/dL (ref 6–20)
CALCIUM: 7.1 mg/dL — AB (ref 8.9–10.3)
CO2: 22 mmol/L (ref 22–32)
Chloride: 100 mmol/L — ABNORMAL LOW (ref 101–111)
Creatinine, Ser: 1.35 mg/dL — ABNORMAL HIGH (ref 0.61–1.24)
GFR calc Af Amer: 58 mL/min — ABNORMAL LOW (ref >60)
GFR, EST NON AFRICAN AMERICAN: 50 mL/min — AB (ref >60)
GLUCOSE: 115 mg/dL — AB (ref 65–99)
Potassium: 4 mmol/L (ref 3.5–5.1)
SODIUM: 131 mmol/L — AB (ref 135–145)

## 2016-03-03 LAB — GLUCOSE, CAPILLARY: Glucose-Capillary: 116 mg/dL — ABNORMAL HIGH (ref 65–99)

## 2016-03-03 LAB — MAGNESIUM: Magnesium: 1.7 mg/dL (ref 1.7–2.4)

## 2016-03-03 SURGERY — ECHOCARDIOGRAM, TRANSESOPHAGEAL
Anesthesia: General

## 2016-03-03 MED ORDER — SODIUM CHLORIDE 0.9 % IV SOLN
INTRAVENOUS | Status: DC
  Administered 2016-03-03: 1000 mL via INTRAVENOUS

## 2016-03-03 MED ORDER — LIDOCAINE 2% (20 MG/ML) 5 ML SYRINGE
INTRAMUSCULAR | Status: AC
  Filled 2016-03-03: qty 5

## 2016-03-03 MED ORDER — METOPROLOL SUCCINATE ER 25 MG PO TB24
12.5000 mg | ORAL_TABLET | Freq: Every day | ORAL | Status: DC
  Administered 2016-03-03: 12.5 mg via ORAL
  Filled 2016-03-03: qty 1

## 2016-03-03 MED ORDER — PROPOFOL 10 MG/ML IV BOLUS
INTRAVENOUS | Status: AC
  Filled 2016-03-03: qty 20

## 2016-03-03 MED ORDER — MIDAZOLAM HCL 5 MG/5ML IJ SOLN
INTRAMUSCULAR | Status: DC | PRN
  Administered 2016-03-03 (×2): 1 mg via INTRAVENOUS

## 2016-03-03 MED ORDER — KETAMINE HCL 10 MG/ML IJ SOLN
INTRAMUSCULAR | Status: DC | PRN
  Administered 2016-03-03 (×4): 10 mg via INTRAVENOUS

## 2016-03-03 MED ORDER — KETAMINE HCL 10 MG/ML IJ SOLN
INTRAMUSCULAR | Status: AC
  Filled 2016-03-03: qty 1

## 2016-03-03 MED ORDER — PHENYLEPHRINE HCL 10 MG/ML IJ SOLN
INTRAMUSCULAR | Status: DC | PRN
  Administered 2016-03-03: 80 ug via INTRAVENOUS

## 2016-03-03 MED ORDER — MIDAZOLAM HCL 2 MG/2ML IJ SOLN
INTRAMUSCULAR | Status: AC
  Filled 2016-03-03: qty 2

## 2016-03-03 MED ORDER — BUTAMBEN-TETRACAINE-BENZOCAINE 2-2-14 % EX AERO
INHALATION_SPRAY | CUTANEOUS | Status: DC | PRN
  Administered 2016-03-03: 2 via TOPICAL

## 2016-03-03 MED ORDER — MORPHINE SULFATE (PF) 2 MG/ML IV SOLN
1.0000 mg | Freq: Once | INTRAVENOUS | Status: AC
  Administered 2016-03-03: 1 mg via INTRAVENOUS
  Filled 2016-03-03: qty 1

## 2016-03-03 NOTE — Anesthesia Procedure Notes (Signed)
Procedure Name: MAC Date/Time: 03/20/2016 2:15 PM Performed by: Barrington Ellison Pre-anesthesia Checklist: Patient identified, Emergency Drugs available, Suction available, Patient being monitored and Timeout performed Patient Re-evaluated:Patient Re-evaluated prior to inductionOxygen Delivery Method: Nasal cannula

## 2016-03-03 NOTE — Progress Notes (Signed)
Pt had pacer is hitting in the middle of the QRS at 1545 and had 11 beats run of VT at 1645.  Pt asymptomatic.  Notified L. Robert.  Instructed to continue monitor.

## 2016-03-03 NOTE — Progress Notes (Signed)
Echocardiogram Echocardiogram Transesophageal has been performed.  Janalyn Harder 03/17/2016, 2:51 PM

## 2016-03-03 NOTE — Care Management Important Message (Signed)
Important Message  Patient Details  Name: Dianna LimboJimmy A Phaneuf MRN: [id number] Date of Birth: 1941/01/04   Medicare Important Message Given:  Yes    Dorena BodoIris Estuardo Frisbee 03/02/2016, 3:03 PM

## 2016-03-03 NOTE — H&P (View-Only) (Signed)
Patient Name: Darin Gonzalez Date of Encounter: 03/02/2016  Primary Cardiologist: Dr. Renee Rival Problem List     Active Problems:   Diabetes mellitus type 2, noninsulin dependent (HCC)   Essential hypertension   PAF (paroxysmal atrial fibrillation) (HCC)   Dehydration   Elevated CK   Elevated serum creatinine   Elevated troponin   Neck mass   Other fatigue    Subjective   Reports fatigue.  Denies chest pain or shortness of breath.    Inpatient Medications    Scheduled Meds: . amiodarone  400 mg Oral BID  . diclofenac sodium  2 g Topical QID  . feeding supplement (ENSURE ENLIVE)  237 mL Oral BID BM  . fluticasone  2 spray Each Nare Daily  . metoprolol succinate  12.5 mg Oral Daily  . pantoprazole  40 mg Oral Daily  . pravastatin  40 mg Oral Daily  . rivaroxaban  20 mg Oral Q supper  . senna  1 tablet Oral BID  . sodium chloride flush  3 mL Intravenous Q12H   Continuous Infusions:  PRN Meds: acetaminophen **OR** acetaminophen, Melatonin, promethazine   Vital Signs    Vitals:   03/02/16 1230 03/02/16 2046 03/07/2016 0429 03/17/2016 0838  BP: 97/76 96/63 (!) 133/49 99/79  Pulse: (!) 110 (!) 114 (!) 116 91  Resp: 18 18 18    Temp: 98.7 F (37.1 C) 97.8 F (36.6 C) 97.4 F (36.3 C)   TempSrc: Oral Oral Oral   SpO2: 100% 97% 97%   Weight:   102.9 kg (226 lb 14.4 oz)   Height:        Intake/Output Summary (Last 24 hours) at 03/24/2016 0921 Last data filed at 03/15/2016 0900  Gross per 24 hour  Intake              540 ml  Output              450 ml  Net               90 ml   Filed Weights   03/01/16 0423 03/02/16 0639 02/29/2016 0429  Weight: 100.9 kg (222 lb 6.4 oz) 101.8 kg (224 lb 8 oz) 102.9 kg (226 lb 14.4 oz)    Physical Exam    GEN: Well nourished, well developed, in no acute distress.  HEENT: Grossly normal.  Neck: Supple, no JVD, carotid bruits, or masses. Cardiac: Irregularly irregular.  Tachycardic.  No murmurs, rubs, or gallops.    Respiratory:  Respirations regular and unlabored, clear to auscultation bilaterally. GI: Soft, nontender, nondistended, BS + x 4. MS: no deformity or atrophy. Skin: warm and dry, no rash. Neuro:  Strength and sensation are intact. Psych: AAOx3.  Normal affect. Ext: Warm.  1+ DP/PT bilaterally.  No edema.   Labs    CBC No results for input(s): WBC, NEUTROABS, HGB, HCT, MCV, PLT in the last 72 hours. Basic Metabolic Panel  Recent Labs  03/01/16 0543 03/02/16 0233 03/02/16 1531 03/05/2016 0231  NA 137 135 132* 131*  K 3.6 3.8 3.9 4.0  CL 107 105 104 100*  CO2 23 22 20* 22  GLUCOSE 89 95 127* 115*  BUN 8 10 12 15   CREATININE 1.05 1.27* 1.31* 1.35*  CALCIUM 7.6* 7.2* 7.3* 7.1*  MG 1.3* 1.2*  --  1.7  PHOS 2.3* 2.3*  --   --    Liver Function Tests No results for input(s): AST, ALT, ALKPHOS, BILITOT, PROT, ALBUMIN in the  last 72 hours. No results for input(s): LIPASE, AMYLASE in the last 72 hours. Cardiac Enzymes  Recent Labs  03/01/16 0543  CKTOTAL 482*   BNP Invalid input(s): POCBNP D-Dimer No results for input(s): DDIMER in the last 72 hours. Hemoglobin A1C No results for input(s): HGBA1C in the last 72 hours. Fasting Lipid Panel No results for input(s): CHOL, HDL, LDLCALC, TRIG, CHOLHDL, LDLDIRECT in the last 72 hours. Thyroid Function Tests No results for input(s): TSH, T4TOTAL, T3FREE, THYROIDAB in the last 72 hours.  Invalid input(s): FREET3  Telemetry    Atrial fibrillation.  Rates 90s-150s Personally Reviewed  ECG    02/28/16: Atrial fibrillation.  Rate 93 bpm. PVCs. Low voltage.  Anterolateral T wave inversion -  - Personally Reviewed  Radiology    Nm Myocar Multi W/spect W/wall Motion / Ef  Result Date: 03/01/2016 CLINICAL DATA:  Diabetes, atrial fibrillation, elevated troponin, syncope EXAM: MYOCARDIAL IMAGING WITH SPECT (REST AND PHARMACOLOGIC-STRESS) GATED LEFT VENTRICULAR WALL MOTION STUDY LEFT VENTRICULAR EJECTION FRACTION TECHNIQUE: Standard  myocardial SPECT imaging was performed after resting intravenous injection of 10 mCi Tc-12m tetrofosmin. Subsequently, intravenous infusion of Lexiscan was performed under the supervision of the Cardiology staff. At peak effect of the drug, 30 mCi Tc-57m tetrofosmin was injected intravenously and standard myocardial SPECT imaging was performed. Quantitative gated imaging was also performed to evaluate left ventricular wall motion, and estimate left ventricular ejection fraction. COMPARISON:  None. FINDINGS: Perfusion: Moderately large fixed apical septal defect. No definite inducible or reversible ischemia with pharmacologic stress. Wall Motion: Septal dyskinesis noted.  No LV chamber dilatation. Left Ventricular Ejection Fraction: 37 % End diastolic volume 65 ml End systolic volume 41 ml IMPRESSION: 1. Moderately large fixed apical septal defect compatible with scar versus remote infarct. No definite inducible ischemia with pharmacologic stress. 2. Septal dyskinesis. 3. Left ventricular ejection fraction 37% 4. Non invasive risk stratification*: Intermediate *2012 Appropriate Use Criteria for Coronary Revascularization Focused Update: J Am Coll Cardiol. 2012;59(9):857-881. http://content.dementiazones.com.aspx?articleid=[email or web address] Electronically Signed   By: Judie Petit.  Shick M.D.   On: 03/01/2016 12:35    Cardiac Studies   [id number] 02/29/16: Study Conclusions  - Left ventricle: ABnormal septal motion inferobasal hypokinesis The cavity size was normal. Wall thickness was normal. Systolic function was mildly reduced. The estimated ejection fraction was in the range of 45% to 50%. - Left atrium: The atrium was mildly dilated. - Atrial septum: No defect or patent foramen ovale was identified.  Lexiscan Myoview 03/01/16: IMPRESSION: 1. Moderately large fixed apical septal defect compatible with scar versus remote infarct. No definite inducible ischemia with pharmacologic stress.  2. Septal  dyskinesis.  3. Left ventricular ejection fraction 37%  4. 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   Assessment & Plan    # Longstanding persistent atrial fibrillation:  # Fall/syncope: Device interrogation showed that he has been in atrial fibrillation with RVR for months.  Plan for TEE/DCCV today at 2pm. He has not been taking Xarelto regularly as an outpatient.  Patient was started on metoprolol this AM.  BP is 90s/70s and was this low yesterday as well.  Will d/c metoprolol. Continue amioarone. K>4, Mg>2.  # Demand ischemia:  Troponin mildly elevated in a pattern consistent with demand ischemia from atrial fibrillation with RVR and volume depletion.  No ischemia on  Lexiscan Myoview this admission.   # R LE pain: Darin Gonzalez reports pain in R leg.  He reports that this is chronic.  ABIs were negative, though he did have heavily calcified vessels which can cause ABI's to have false negatives.  Consider CLI as an outpatient.  He isn't on an aspirin given that he is on Xarelto.  Continue pravastatin.   # CHB s/p PPM: Not currently in CHB as he is not pacing.  Device functioning well on interrogation.   Signed, Chilton Siiffany [place], MD  03/17/2016, 9:21 AM

## 2016-03-03 NOTE — Progress Notes (Signed)
Patient Name: Darin Gonzalez Date of Encounter: 02/25/2016  Primary Cardiologist: Dr. Renee Rival Problem List     Active Problems:   Diabetes mellitus type 2, noninsulin dependent (HCC)   Essential hypertension   PAF (paroxysmal atrial fibrillation) (HCC)   Dehydration   Elevated CK   Elevated serum creatinine   Elevated troponin   Neck mass   Other fatigue    Subjective   Reports fatigue.  Denies chest pain or shortness of breath.    Inpatient Medications    Scheduled Meds: . amiodarone  400 mg Oral BID  . diclofenac sodium  2 g Topical QID  . feeding supplement (ENSURE ENLIVE)  237 mL Oral BID BM  . fluticasone  2 spray Each Nare Daily  . metoprolol succinate  12.5 mg Oral Daily  . pantoprazole  40 mg Oral Daily  . pravastatin  40 mg Oral Daily  . rivaroxaban  20 mg Oral Q supper  . senna  1 tablet Oral BID  . sodium chloride flush  3 mL Intravenous Q12H   Continuous Infusions:  PRN Meds: acetaminophen **OR** acetaminophen, Melatonin, promethazine   Vital Signs    Vitals:   03/02/16 1230 03/02/16 2046 03/22/2016 0429 03/05/2016 0838  BP: 97/76 96/63 (!) 133/49 99/79  Pulse: (!) 110 (!) 114 (!) 116 91  Resp: 18 18 18    Temp: 98.7 F (37.1 C) 97.8 F (36.6 C) 97.4 F (36.3 C)   TempSrc: Oral Oral Oral   SpO2: 100% 97% 97%   Weight:   102.9 kg (226 lb 14.4 oz)   Height:        Intake/Output Summary (Last 24 hours) at 03/06/2016 0921 Last data filed at 03/10/2016 0900  Gross per 24 hour  Intake              540 ml  Output              450 ml  Net               90 ml   Filed Weights   03/01/16 0423 03/02/16 0639 03/19/2016 0429  Weight: 100.9 kg (222 lb 6.4 oz) 101.8 kg (224 lb 8 oz) 102.9 kg (226 lb 14.4 oz)    Physical Exam    GEN: Well nourished, well developed, in no acute distress.  HEENT: Grossly normal.  Neck: Supple, no JVD, carotid bruits, or masses. Cardiac: Irregularly irregular.  Tachycardic.  No murmurs, rubs, or gallops.    Respiratory:  Respirations regular and unlabored, clear to auscultation bilaterally. GI: Soft, nontender, nondistended, BS + x 4. MS: no deformity or atrophy. Skin: warm and dry, no rash. Neuro:  Strength and sensation are intact. Psych: AAOx3.  Normal affect. Ext: Warm.  1+ DP/PT bilaterally.  No edema.   Labs    CBC No results for input(s): WBC, NEUTROABS, HGB, HCT, MCV, PLT in the last 72 hours. Basic Metabolic Panel  Recent Labs  03/01/16 0543 03/02/16 0233 03/02/16 1531 03/24/2016 0231  NA 137 135 132* 131*  K 3.6 3.8 3.9 4.0  CL 107 105 104 100*  CO2 23 22 20* 22  GLUCOSE 89 95 127* 115*  BUN 8 10 12 15   CREATININE 1.05 1.27* 1.31* 1.35*  CALCIUM 7.6* 7.2* 7.3* 7.1*  MG 1.3* 1.2*  --  1.7  PHOS 2.3* 2.3*  --   --    Liver Function Tests No results for input(s): AST, ALT, ALKPHOS, BILITOT, PROT, ALBUMIN in the  last 72 hours. No results for input(s): LIPASE, AMYLASE in the last 72 hours. Cardiac Enzymes  Recent Labs  03/01/16 0543  CKTOTAL 482*   BNP Invalid input(s): POCBNP D-Dimer No results for input(s): DDIMER in the last 72 hours. Hemoglobin A1C No results for input(s): HGBA1C in the last 72 hours. Fasting Lipid Panel No results for input(s): CHOL, HDL, LDLCALC, TRIG, CHOLHDL, LDLDIRECT in the last 72 hours. Thyroid Function Tests No results for input(s): TSH, T4TOTAL, T3FREE, THYROIDAB in the last 72 hours.  Invalid input(s): FREET3  Telemetry    Atrial fibrillation.  Rates 90s-150s Personally Reviewed  ECG    02/28/16: Atrial fibrillation.  Rate 93 bpm. PVCs. Low voltage.  Anterolateral T wave inversion -  - Personally Reviewed  Radiology    Nm Myocar Multi W/spect W/wall Motion / Ef  Result Date: 03/01/2016 CLINICAL DATA:  Diabetes, atrial fibrillation, elevated troponin, syncope EXAM: MYOCARDIAL IMAGING WITH SPECT (REST AND PHARMACOLOGIC-STRESS) GATED LEFT VENTRICULAR WALL MOTION STUDY LEFT VENTRICULAR EJECTION FRACTION TECHNIQUE: Standard  myocardial SPECT imaging was performed after resting intravenous injection of 10 mCi Tc-12m tetrofosmin. Subsequently, intravenous infusion of Lexiscan was performed under the supervision of the Cardiology staff. At peak effect of the drug, 30 mCi Tc-57m tetrofosmin was injected intravenously and standard myocardial SPECT imaging was performed. Quantitative gated imaging was also performed to evaluate left ventricular wall motion, and estimate left ventricular ejection fraction. COMPARISON:  None. FINDINGS: Perfusion: Moderately large fixed apical septal defect. No definite inducible or reversible ischemia with pharmacologic stress. Wall Motion: Septal dyskinesis noted.  No LV chamber dilatation. Left Ventricular Ejection Fraction: 37 % End diastolic volume 65 ml End systolic volume 41 ml IMPRESSION: 1. Moderately large fixed apical septal defect compatible with scar versus remote infarct. No definite inducible ischemia with pharmacologic stress. 2. Septal dyskinesis. 3. Left ventricular ejection fraction 37% 4. Non invasive risk stratification*: Intermediate *2012 Appropriate Use Criteria for Coronary Revascularization Focused Update: J Am Coll Cardiol. 2012;59(9):857-881. http://content.[email or web address].aspx?articleid=1201161 Electronically Signed   By: Judie Petit.  Shick M.D.   On: 03/01/2016 12:35    Cardiac Studies   [id number] 02/29/16: Study Conclusions  - Left ventricle: ABnormal septal motion inferobasal hypokinesis The cavity size was normal. Wall thickness was normal. Systolic function was mildly reduced. The estimated ejection fraction was in the range of 45% to 50%. - Left atrium: The atrium was mildly dilated. - Atrial septum: No defect or patent foramen ovale was identified.  Lexiscan Myoview 03/01/16: IMPRESSION: 1. Moderately large fixed apical septal defect compatible with scar versus remote infarct. No definite inducible ischemia with pharmacologic stress.  2. Septal  dyskinesis.  3. Left ventricular ejection fraction 37%  4. 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   Assessment & Plan    # Longstanding persistent atrial fibrillation:  # Fall/syncope: Device interrogation showed that he has been in atrial fibrillation with RVR for months.  Plan for TEE/DCCV today at 2pm. He has not been taking Xarelto regularly as an outpatient.  Patient was started on metoprolol this AM.  BP is 90s/70s and was this low yesterday as well.  Will d/c metoprolol. Continue amioarone. K>4, Mg>2.  # Demand ischemia:  Troponin mildly elevated in a pattern consistent with demand ischemia from atrial fibrillation with RVR and volume depletion.  No ischemia on  Lexiscan Myoview this admission.   # R LE pain: Darin Gonzalez reports pain in R leg.  He reports that this is chronic.  ABIs were negative, though he did have heavily calcified vessels which can cause ABI's to have false negatives.  Consider CLI as an outpatient.  He isn't on an aspirin given that he is on Xarelto.  Continue pravastatin.   # CHB s/p PPM: Not currently in CHB as he is not pacing.  Device functioning well on interrogation.   Signed, Chilton Siiffany [place], MD  03/17/2016, 9:21 AM

## 2016-03-03 NOTE — Discharge Instructions (Signed)

## 2016-03-03 NOTE — Progress Notes (Signed)
Physical Therapy Treatment Patient Details Name: Darin Gonzalez MRN: [id number] DOB: [birth year] Today's Date: 03/24/2016    History of Present Illness Pt adm after fall at home. Pt found to be hypotensive and dehydrated. PMH - DM, HTN, afib, pacer    PT Comments    Activity during PT session limited by the patient's reports of Rt knee pain. Pt willing to participate and attempted sit<>stand from EOB but reportedly unable to attempt any steps due to the pain. Nursing was aware and had provided medication earlier. Discussed with pt that we will need to continue to work with PT to progress ambulation in order to D/C home safety. If the pt is unable to progress his ambulation as anticipated, modification to D/C recommendations may be needed. PT to continue to follow.    Follow Up Recommendations  Home health PT;Supervision - Intermittent     Equipment Recommendations  Rolling walker with 5" wheels    Recommendations for Other Services       Precautions / Restrictions Precautions Precautions: Fall Restrictions Weight Bearing Restrictions: No    Mobility  Bed Mobility Overal bed mobility: Needs Assistance Bed Mobility: Supine to Sit;Sit to Supine     Supine to sit: Mod assist;HOB elevated Sit to supine: Mod assist   General bed mobility comments: Pt needing assist at trunk and LEs. Encouraging pt assistance.   Transfers Overall transfer level: Needs assistance Equipment used: Rolling walker (2 wheeled)   Sit to Stand: Mod assist         General transfer comment: Repeating sit<>stand from EOB X3. Pt reports Rt LE pain as limiting factor.   Ambulation/Gait             General Gait Details: unable to ambulate or attempt pivot to chiar per pt report of Rt knee pain.    Stairs            Wheelchair Mobility    Modified Rankin (Stroke Patients Only)       Balance Overall balance assessment: Needs assistance Sitting-balance support: No upper extremity  supported Sitting balance-Leahy Scale: Good     Standing balance support: Bilateral upper extremity supported Standing balance-Leahy Scale: Poor Standing balance comment: reliance on Rw in standing                    Cognition Arousal/Alertness: Awake/alert Behavior During Therapy: WFL for tasks assessed/performed;Anxious Overall Cognitive Status: History of cognitive impairments - at baseline                      Exercises      General Comments General comments (skin integrity, edema, etc.): son and daughter-in-law present for session      Pertinent Vitals/Pain Pain Assessment: 0-10 Pain Score: 7  Pain Location: Rt knee Pain Descriptors / Indicators: Aching;Sharp Pain Intervention(s): Limited activity within patient's tolerance;Monitored during session    Home Living                      Prior Function            PT Goals (current goals can now be found in the care plan section) Acute Rehab PT Goals Patient Stated Goal: return home PT Goal Formulation: With patient/family Time For Goal Achievement: 03/06/16 Potential to Achieve Goals: Good Progress towards PT goals: Not progressing toward goals - comment (no progress during today's session due to pain )    Frequency    Min 3X/week  PT Plan Current plan remains appropriate    Co-evaluation             End of Session Equipment Utilized During Treatment: Gait belt Activity Tolerance: Patient limited by pain Patient left: in bed;with call bell/phone within reach;with family/visitor present;with nursing/sitter in room     Time: 1610-9604 PT Time Calculation (min) (ACUTE ONLY): 24 min  Charges:  $Therapeutic Activity: 23-37 mins                    G Codes:      Christiane Ha, PT, CSCS Pager [phone] Office [phone]  03/13/2016, 3:19 PM

## 2016-03-03 NOTE — CV Procedure (Addendum)
Procedure: TEE  Indication: Atrial fibrillation  Sedation: Per anesthesiology  Findings:  Please see echo section for full report.  The patient was in atrial fibrillation.  Normal LV size and thickness, EF 55%.  Mildly dilated RV with normal systolic function.  PPM in RV.  There was trivial TR. There was trivial MR.  Mildly calcified, trileaflet aortic valve with no stenosis or regurgitation.  Moderate biatrial enlargement.  No LAA thrombus.  No PFO/ASD by color doppler.  Normal caliber thoracic aorta with minimal plaque.  Small pericardial effusion.   Impression: No LA thrombus, may proceed to DCCV.   Darin Gonzalez 03/16/2016 2:43 PM

## 2016-03-03 NOTE — Progress Notes (Signed)
VASCULAR LAB PRELIMINARY  ARTERIAL  ABI completed:Bilateral ABIs appeared normal at rest. However, these values may be falsely elevated secondary to calcified vessels. Toe to brachial indices were technically difficult to obtained due to patient not able to cooperate   RIGHT    LEFT    PRESSURE WAVEFORM  PRESSURE WAVEFORM  BRACHIAL 97 Triphasic BRACHIAL 99 Triphasic  DP 90 biphasic DP 102 biphasic  PT 124 biphasic PT 94 biphasic  PER   PER    GREAT TOE  NA GREAT TOE  NA    RIGHT LEFT  ABI 1.2 1.0     Darin Gonzalez D, RVT 03/18/2016, 9:31 AM

## 2016-03-03 NOTE — Procedures (Signed)
Electrical Cardioversion Procedure Note Darin LimboJimmy A Gonzalez [id number] April 17, 1940  Procedure: Electrical Cardioversion Indications:  Atrial Fibrillation.  The patient is on Xarelto and no thrombus was seen in LA on TEE.   Procedure Details Consent: Risks of procedure as well as the alternatives and risks of each were explained to the (patient/caregiver).  Consent for procedure obtained. Time Out: Verified patient identification, verified procedure, site/side was marked, verified correct patient position, special equipment/implants available, medications/allergies/relevent history reviewed, required imaging and test results available.  Performed  Patient placed on cardiac monitor, pulse oximetry, supplemental oxygen as necessary.  Sedation given: Propofol per anesthesiology Pacer pads placed anterior and posterior chest.  Cardioverted 2 time(s).  Cardioverted at 200J.  Evaluation Findings: Post procedure EKG shows: NSR with PACs.  Complications: None Patient did tolerate procedure well.  St Jude rep interrogated PPM afterwards, was functioning normally.    Darin Gonzalez 03/08/2016, 2:43 PM

## 2016-03-03 NOTE — Transfer of Care (Signed)
Immediate Anesthesia Transfer of Care Note  Patient: Darin Gonzalez  Procedure(s) Performed: Procedure(s): TRANSESOPHAGEAL ECHOCARDIOGRAM (TEE) (N/A) CARDIOVERSION (N/A)  Patient Location: Endoscopy Unit  Anesthesia Type:MAC  Level of Consciousness: lethargic and responds to stimulation  Airway & Oxygen Therapy: Patient Spontanous Breathing and Patient connected to nasal cannula oxygen  Post-op Assessment: Report given to RN  Post vital signs: Reviewed and stable  Last Vitals:  Vitals:   03/11/2016 1152 03/12/2016 1316  BP: (!) 86/58 (!) 79/45  Pulse: (!) 118 81  Resp: 18 (!) 25  Temp:  36.6 C    Last Pain:  Vitals:   02/25/2016 1316  TempSrc: Oral  PainSc:       Patients Stated Pain Goal: 0 (82/95/62 1308)  Complications: No apparent anesthesia complications

## 2016-03-03 NOTE — Interval H&P Note (Signed)
History and Physical Interval Note:  03/16/2016 2:30 PM  Darin Gonzalez  has presented today for surgery, with the diagnosis of a fib  The various methods of treatment have been discussed with the patient and family. After consideration of risks, benefits and other options for treatment, the patient has consented to  Procedure(s): TRANSESOPHAGEAL ECHOCARDIOGRAM (TEE) (N/A) CARDIOVERSION (N/A) as a surgical intervention .  The patient's history has been reviewed, patient examined, no change in status, stable for surgery.  I have reviewed the patient's chart and labs.  Questions were answered to the patient's satisfaction.     [name] [employer]

## 2016-03-03 NOTE — Anesthesia Postprocedure Evaluation (Signed)
Anesthesia Post Note  Patient: Dianna LimboJimmy A Macy  Procedure(s) Performed: Procedure(s) (LRB): TRANSESOPHAGEAL ECHOCARDIOGRAM (TEE) (N/A) CARDIOVERSION (N/A)  Patient location during evaluation: PACU Anesthesia Type: General Level of consciousness: awake and alert Pain management: pain level controlled Vital Signs Assessment: post-procedure vital signs reviewed and stable Respiratory status: spontaneous breathing, nonlabored ventilation, respiratory function stable and patient connected to nasal cannula oxygen Cardiovascular status: blood pressure returned to baseline and stable Postop Assessment: no signs of nausea or vomiting Anesthetic complications: no       Last Vitals:  Vitals:   03/02/2016 1500 03/08/2016 1508  BP: (!) 102/57   Pulse: (!) 103 98  Resp: 19 (!) 24  Temp:      Last Pain:  Vitals:   03/10/2016 1508  TempSrc:   PainSc: 7                  Mashell Sieben,W. EDMOND

## 2016-03-03 NOTE — Progress Notes (Signed)
Subjective: Darin Gonzalez said that his right leg was feeling painful this morning and he could not get up to walk on it. He said that he has had right mid lower leg pain on the anterior surface in the past especially after walking but it resolved with rest.  He also said that this was a different pain than he has been having here in the hospital to date.  He said that he might have some difficulty breathing and was feeling a little weaker this morning.    Objective: Vital signs in last 24 hours: Vitals:   03/02/16 1230 03/02/16 2046 03/18/2016 0429 03/23/2016 0838  BP: 97/76 96/63 (!) 133/49 99/79  Pulse: (!) 110 (!) 114 (!) 116 91  Resp: _0 Temp: 98.7 F (37.1 C) 97.8 F (36.6 C) 97.4 F (36.3 C)   TempSrc: Oral Oral Oral   SpO2: 100% 97% 97%   Weight:   102.9 kg (226 lb 14.4 oz)   Height:       Weight change: 1.089 kg (2 lb 6.4 oz)  Intake/Output Summary (Last 24 hours) at 02/25/2016 0934 Last data filed at 03/12/2016 0900  Gross per 24 hour  Intake              540 ml  Output              450 ml  Net               90 ml   Physical Exam Gen: Generally well-appearing, but in mild pain. HEENT: NCAT. EOMI. Moist mucous membranes.  CV: Distant heart sounds in irregular rhythm. Pulm:  CTAB, no wheezes or rhonchi. Abdomen: Soft, non-tender to palpation in four quadrants.  Normal BS appreciated.  Extremities: R foot and ankle cool to the touch.  No pain with on palpation of ant leg or calf and no pain with foot dorsiflexion. Darkening of lower legs bilaterally.  New 1 cm ecchymosis noted on right leg.    Lab Results: BMP Latest Ref Rng & Units 03/18/2016 03/02/2016 03/02/2016  Glucose 65 - 99 mg/dL 115(H) 127(H) 95  BUN 6 - 20 mg/dL _1 Creatinine 0.61 - 1.24 mg/dL 1.35(H) 1.31(H) 1.27(H)  Sodium 135 - 145 mmol/L 131(L) 132(L) 135  Potassium 3.5 - 5.1 mmol/L 4.0 3.9 3.8  Chloride 101 - 111 mmol/L 100(L) 104 105  CO2 22 - 32 mmol/L 22 20(L) 22  Calcium 8.9 - 10.3 mg/dL  7.1(L) 7.3(L) 7.2(L)   1/6 Mg -- 1.3 Phos -- 2.3  1/6 ESR - 26 CRP - 3.8  CK -- 996 (1/4) >> 482 (1/6)   Micro Results: Recent Results (from the past 240 hour(s))  Urine culture     Status: None   Collection Time: 02/26/2016 12:30 PM  Result Value Ref Range Status   Specimen Description URINE, RANDOM  Final   Special Requests NONE  Final   Culture NO GROWTH  Final   Report Status 02/28/2016 FINAL  Final   Studies/Results: Nm Myocar Multi W/spect W/wall Motion / Ef  Result Date: 03/01/2016 CLINICAL DATA:  Diabetes, atrial fibrillation, elevated troponin, syncope EXAM: MYOCARDIAL IMAGING WITH SPECT (REST AND PHARMACOLOGIC-STRESS) GATED LEFT VENTRICULAR WALL MOTION STUDY LEFT VENTRICULAR EJECTION FRACTION TECHNIQUE: Standard myocardial SPECT imaging was performed after resting intravenous injection of 10 mCi Tc-62mtetrofosmin. Subsequently, intravenous infusion of Lexiscan was performed under the supervision of the Cardiology staff. At peak effect of the drug, 30 mCi Tc-92metrofosmin was  injected intravenously and standard myocardial SPECT imaging was performed. Quantitative gated imaging was also performed to evaluate left ventricular wall motion, and estimate left ventricular ejection fraction. COMPARISON:  None. FINDINGS: Perfusion: Moderately large fixed apical septal defect. No definite inducible or reversible ischemia with pharmacologic stress. Wall Motion: Septal dyskinesis noted.  No LV chamber dilatation. Left Ventricular Ejection Fraction: 37 % End diastolic volume 65 ml End systolic volume 41 ml IMPRESSION: 1. Moderately large fixed apical septal defect compatible with scar versus remote infarct. No definite inducible ischemia with pharmacologic stress. 2. Septal dyskinesis. 3. Left ventricular ejection fraction 37% 4. Non invasive risk stratification*: Intermediate *2012 Appropriate Use Criteria for Coronary Revascularization Focused Update: J Am Coll Cardiol. 1448;18(5):631-497.  http://[email or web address] Electronically Signed   By: Jerilynn Mages.  Shick M.D.   On: 03/01/2016 12:35   Echocardiogram 02/29/16  Left ventricle: Abnormal septal motion inferobasal hypokinesis The cavity size was normal. Wall thickness was normal. Systolic function was mildly reduced. The estimated ejection fraction was in the range of 45% to 50%.  03/01/16 NM Myocar multi IMPRESSION: 1. Moderately large fixed apical septal defect compatible with scar versus remote infarct. No definite inducible ischemia with pharmacologic stress. 2. Septal dyskinesis. 3. Left ventricular ejection fraction 37% 4. Non invasive risk stratification*: Intermediate  ABI 1/8   RIGHT LEFT  ABI 1.2 1.0    Medications: I have reviewed the patient's current medications. Scheduled Meds: . amiodarone  400 mg Oral BID  . diclofenac sodium  2 g Topical QID  . feeding supplement (ENSURE ENLIVE)  237 mL Oral BID BM  . fluticasone  2 spray Each Nare Daily  . metoprolol succinate  12.5 mg Oral Daily  . pantoprazole  40 mg Oral Daily  . pravastatin  40 mg Oral Daily  . rivaroxaban  20 mg Oral Q supper  . senna  1 tablet Oral BID  . sodium chloride flush  3 mL Intravenous Q12H   Continuous Infusions: PRN Meds:.acetaminophen **OR** acetaminophen, Melatonin, promethazine Assessment/Plan: Mr. Hora is a 76 yo man with h/oPAF, CHB s/p pacemaker 04/2015, DM, HTN, and HLD who presented on 1/3 with hypotension, generalized weakness, and recent fall at home and was admitted for dehydration and cardiac ischemia.  Active Problems:   Diabetes mellitus type 2, noninsulin dependent (HCC)   Essential hypertension   PAF (paroxysmal atrial fibrillation) (HCC)   Dehydration   Elevated CK   Elevated serum creatinine   Elevated troponin   Neck mass   Other fatigue  Leg pain: Initial concerns for ischemia due to patient's history of claudication symptoms.  DVT is possible, but less likely given  his rivaroxaban treatment. ABI was normal and DP and PT were biphasic. - ABI and DP/PT dopplers - IV morphine 18m for pain  Fall and dehydration: Bp was initially lower than baseline at ~80/50. Negative testing for orthostatic hypotension on 03/23/2016. AM cortisol 01/06 WNL for possible AI.  01/06 bp normalized at ~130/70, but bp 1/8 dropped agin to 95/70. - PT / OT - Tylenol and voltargen gel PRN for pain  Elevated troponins:0.31-->0.23-->0.19. Likely demand ischemia/ afib precipitation.Pt has no chest pain.   Myocardial perfusion testing revealed EF 37% with intermediate noninvasive risk stratification. - ASA 3272m- Will follow cardiology recs - medical therapy currently  HFrEF: NM study 1/6 revealed EF 37% with septal dyskinesis.  Strong family history of ischemic HF - on metoprolol XL 12.5 mg and consider ACE-I if bp improves - will follow cardiology recs  Afib: Pt is  on Xarelto and new low dose metoprolol. Pacemaker interrogation showed he had been in AF since June.CHAD2VASC score of at least 4. 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 U/s revealed benign appearing cystic mass in parotid gland.   GERD: - continue protonix   DM: Last A1c in March 2017 being 6.8. Pt on metformin at home. -SSI-S TID WC  HTN: BP non-hypertensive -metoprolol XL 12.5   AKI: Was resolving with IV fluids. Serum Cr 1.66 >> 1.06, however back uptrending over the past two days without IVF.   - will trend BMP  Hypomagnesemia: 1.3 1/5 and persisted low after magnesium oxide.  On 1/6 had an episode of polymorphic VT and was given IV Mg sulfate. - Mg oxide BID -trend Mg  Hypophosphatemia: 2.3 on 1/6. - will not replete today due to elevated CK concerning for rhabdo  - will recheck tomorrow   This is a Location manager Note.  The care of the patient was discussed with Dr. Wynetta Emery and the assessment and plan formulated with their assistance.  Please see their attached note for official documentation of the daily encounter.   LOS: 4 days   Doug Sou, Medical Student 03/13/2016, 9:34 AM

## 2016-03-03 NOTE — Progress Notes (Addendum)
Subjective: This morning [name] is complaining of severe right lower leg pain, "on the front on my leg" that extends down his calf to his foot. RLE feels cool to touch with trace palpable pulses, no swelling or asymmetry, no posterior calf tenderness. He does endorse history of claudication-like symptoms - pain/aching with walking for weeks to months in past. He continues to be in Afib with intermittent RVR to 140s-150s all weekend on telemetry. Has been on Xarelto for anticoagulation. Clinically concerning for acute limb ischemia, cardioembolic event.  Stat ABI were 1.2 on right and 1.0 on left, with biphasic DP/PT pulses bilaterally, could not obtain toe brachial indices due to patient cooperation. Reassuring  Cardiology notified and planning TEE/DCCV today  VVS consulted to evaluate.  Objective: Vital signs in last 24 hours: Vitals:   03/02/16 1230 03/02/16 2046 03/16/2016 0429 03/02/2016 0838  BP: 97/76 96/63 (!) 133/49 99/79  Pulse: (!) 110 (!) 114 (!) 116 91  Resp: '18 18 18   ' Temp: 98.7 F (37.1 C) 97.8 F (36.6 C) 97.4 F (36.3 C)   TempSrc: Oral Oral Oral   SpO2: 100% 97% 97%   Weight:   226 lb 14.4 oz (102.9 kg)   Height:        Intake/Output Summary (Last 24 hours) at 02/26/2016 1017 Last data filed at 03/02/2016 0900  Gross per 24 hour  Intake              540 ml  Output              450 ml  Net               90 ml   Physical Exam Physical Exam  Constitutional: He is oriented to person, place, and time. He appears well-developed. He appears distressed.  Moaning, reports fatigue, pain, and dyspnea  HENT:  Head: Normocephalic.  Mouth/Throat: Oropharynx is clear and moist.  Neck: Normal range of motion. Neck supple.  Cardiovascular: Intact distal pulses.  An irregularly irregular rhythm present. Exam reveals no friction rub.   No murmur heard. Distant heart sounds  Pulmonary/Chest: Breath sounds normal. He is in respiratory distress. He has no wheezes. He has no  rales.  Abdominal: Soft. He exhibits no distension. There is no tenderness.  Musculoskeletal: Normal range of motion. He exhibits tenderness. He exhibits no edema or deformity.       Legs: Neurological: He is alert and oriented to person, place, and time. No sensory deficit. He exhibits normal muscle tone.  4/5 proximal strength in BLEs, otherwise 5/5 throughout  Skin: Skin is dry. No erythema. No pallor.  Hyperpigmentation over lower legs  Vitals reviewed.  Labs / Imaging / Procedures: CBC Latest Ref Rng & Units 02/28/2016 03/06/2016 02/26/2016  WBC 4.0 - 10.5 K/uL 8.8 - 11.4(H)  Hemoglobin 13.0 - 17.0 g/dL 12.8(L) 13.6 13.9  Hematocrit 39.0 - 52.0 % 38.3(L) 40.0 41.2  Platelets 150 - 400 K/uL 177 - 200   BMP Latest Ref Rng & Units 03/15/2016 03/02/2016 03/02/2016  Glucose 65 - 99 mg/dL 115(H) 127(H) 95  BUN 6 - 20 mg/dL '15 12 10  ' Creatinine 0.61 - 1.24 mg/dL 1.35(H) 1.31(H) 1.27(H)  Sodium 135 - 145 mmol/L 131(L) 132(L) 135  Potassium 3.5 - 5.1 mmol/L 4.0 3.9 3.8  Chloride 101 - 111 mmol/L 100(L) 104 105  CO2 22 - 32 mmol/L 22 20(L) 22  Calcium 8.9 - 10.3 mg/dL 7.1(L) 7.3(L) 7.2(L)   No results found.  VASCULAR  LAB PRELIMINARY ARTERIAL  ABI completed:Bilateral ABIs appeared normal at rest. However, these values may be falsely elevated secondary to calcified vessels. Toe to brachial indices were technically difficult to obtained due to patient not able to cooperate   RIGHT   LEFT    PRESSURE WAVEFORM  PRESSURE WAVEFORM  BRACHIAL 97 Triphasic BRACHIAL 99 Triphasic  DP 90 biphasic DP 102 biphasic  PT 124 biphasic PT 94 biphasic  PER   PER    GREAT TOE  NA GREAT TOE  NA    RIGHT LEFT  ABI 1.2 1.0    Assessment/Plan: 76 yo man with history of DM, HTN, HLD, PAF, history of CHB s/p pacemaker 04/2015 who presented on 1/3 with hypotension, generalized weakness, and recent fall at home.  Right leg pain, acute severe RLE pain, cool to touch this morning, unable to walk  or bear weight, reports this pain has been going on for months to years - although he certainly was not having pain here previously during his hospital stay. Initial concern was for acute limb ischemia. No leg swelling or calf tenderness to palpation, not suggestive of DVT. - Stat ABI this AM reassuring - IV morphine 63m 1x for pain control - VVS consulted / cancelled  PAF and CHB s/p pacemaker, CHADSVASC score 7, in AF. Started on Metoprolol and Amiodarone this admission. Tele today shows AF with intermittent RVR to 140s.  - Amiodarone 4093mBID (reduce to 200 mg daily around 03/16/16) - [facility] - Metoprolol XR 12.5 mg QD, defer potential increase to 25 mg given hypotension - Xarelto 20 mg daily - Telemetry  Fall, vs syncope - unclear if mechanical fall vs syncope, coincided with profound weakness and remained on floor for 12 hours, having persistent weakness and hypotension, also with poor po intake. History of CHB with pacemaker and PAF, on Xarelto - CT head, c-spine, hip XR with no acute abnml/fracture. TTE shows EF 45-50%. NM myocar shows intermediate risk.  Mild elevated ESR and CRP - may have underlying inflammatory condition - may need further evaluation. Persistent poorly-controled Atrial fibrillation with episodes of RVR and hypomagnesemia could be contributing to fatigue as well.  - Telemetry - PT OT eval/treat - recommending HH PTOT and intermittent supervision - Tylenol PRN for aches/pains - Voltaren gel PRN for aches/pains  Elevated Troponin,peaked at 0.31 >>0.19. Denied any chest pain, stuck on floor for 12 hours after a fall, resulting in mild rhabdo/elevated CK to 1000, possible source. Also with intermittent AF RVR which may be resulting in troponin leak. - TTE, LVEF 45-50%. NM study with fixed apical defect possibly related to pacing, intermediate risk but no ischemia - Medical therapy per cardiology recs  HFrEF, unclear  if acute vs chronic, LVEF 45-50% with inferobasilar hypokinesis found on TTE 1/5. Intermediate risk NM study on 1/6. Had mild troponin elevation to 0.31 on admission. - On beta blocker, hold of on starting ACE inhibitor given ongoing hypotension - Appreciate cardiology recommendations  Hypotension - has improved after decreasing Metoprolol dose. AM cortisol has been negative.  -continue to monitor  HypoMag, resolving 1.7, oral and IV supplementation, episode of polymorphic VT on 1/6:  - Mag oxide BID - Trend Mg daily  AKI, mild, creatinine 1.50 on admission - improving with IV fluids and hydration - Trend BMP  GERD - continue home Protonix HLD - continue home Pravachol  Dispo: Anticipated discharge in approximately 1-2 days.  LOS: 4 days   AdAsencion Partridge  MD 03/17/2016, 10:17 AM Pager: [phone]

## 2016-03-03 NOTE — Anesthesia Preprocedure Evaluation (Addendum)
Anesthesia Evaluation  Patient identified by MRN, date of birth, ID band Patient awake    Reviewed: Allergy & Precautions, H&P , NPO status , Patient's Chart, lab work & pertinent test results  Airway Mallampati: III  TM Distance: >3 FB Neck ROM: Full    Dental no notable dental hx. (+) Edentulous Upper, Edentulous Lower, Dental Advisory Given   Pulmonary asthma , former smoker,    Pulmonary exam normal breath sounds clear to auscultation       Cardiovascular hypertension, Pt. on medications + dysrhythmias Atrial Fibrillation + pacemaker  Rhythm:Irregular Rate:Normal     Neuro/Psych negative neurological ROS  negative psych ROS   GI/Hepatic Neg liver ROS, GERD  Medicated and Controlled,  Endo/Other  diabetes, Type 2, Oral Hypoglycemic Agents  Renal/GU negative Renal ROS  negative genitourinary   Musculoskeletal  (+) Arthritis , Osteoarthritis,    Abdominal   Peds  Hematology negative hematology ROS (+)   Anesthesia Other Findings   Reproductive/Obstetrics negative OB ROS                            Anesthesia Physical Anesthesia Plan  ASA: III  Anesthesia Plan: General   Post-op Pain Management:    Induction: Intravenous  Airway Management Planned: Nasal Cannula  Additional Equipment:   Intra-op Plan:   Post-operative Plan:   Informed Consent: I have reviewed the patients History and Physical, chart, labs and discussed the procedure including the risks, benefits and alternatives for the proposed anesthesia with the patient or authorized representative who has indicated his/her understanding and acceptance.   Dental advisory given  Plan Discussed with: CRNA  Anesthesia Plan Comments:        Anesthesia Quick Evaluation

## 2016-03-03 NOTE — Consult Note (Signed)
Referring Physician: Althia FortsAdam Johnson  Patient name: Darin LimboJimmy A Gonzalez MRN: [id number] DOB: 05-07-1940 Sex: male  REASON FOR CONSULT: acute ischemia right foot  HPI: Darin LimboJimmy A Gonzalez is a 76 y.o. male, with acute onset pain right leg from knee down to foot about an hour ago.  The pain is now improving.  He states he has been getting this type of pain off and on for several years.  He does not know what causes it and has never had evaluation of it.  He attributes it to years of walking on concrete floors.  The pain can come on with activity or rest.  He denies any numbness or tingling in the foot.  He never has problems with the left leg.  He was admitted with fall/cardiac event several days ago.  He has chronic history of paroxysmal afib.  He has been on xarelto as an outpt but possible compliance issues.  He has been on scheduled xarelto in the hospital for the last 5 days.  He had an ECHO this admission that did not show ventricular thrombus though it was a transthoracic echo.  Pt was scheduled for TEE today but this was cancelled due to leg pain.  He has had no prior afib embolic events.  He does not describe claudication symptoms rest pain or prior non healing foot wounds.  Other medical problems include elevated cholesterol, hypertension, diabetes all of which have been stable.  Past Medical History:  Diagnosis Date  . Arthritis    "back" (03/24/2016)  . Fall at home 02/26/2016   striking the back of his head  . GERD (gastroesophageal reflux disease)   . Hypercholesteremia   . Hypertension   . Paroxysmal atrial fibrillation with rapid ventricular response (HCC) 2010   in the setting of abdominal abscess  . Presence of permanent cardiac pacemaker   . Type II diabetes mellitus (HCC)    Past Surgical History:  Procedure Laterality Date  . Abdominal percutaneous abscess drain  2010   abscess from ruptured appendix  . APPENDECTOMY  2010  . EP IMPLANTABLE DEVICE N/A 04/30/2015   Procedure:  Pacemaker Implant;  Surgeon: Marinus MawGregg W Taylor, MD;  [facility];  Service: Cardiovascular;  Laterality: N/A;  . FINGER AMPUTATION    . HEMORRHOID BANDING    . TONSILLECTOMY  1947    Family History  Problem Relation Age of Onset  . Heart disease Mother   . Heart failure Mother   . Heart disease Father   . Heart failure Father   . Heart disease Brother   . Heart failure Brother     SOCIAL HISTORY: Social History   Social History  . Marital status: Legally Separated    Spouse name: N/A  . Number of children: N/A  . Years of education: N/A   Occupational History  . Retired    Social History Main Topics  . Smoking status: Former Smoker    Packs/day: 2.00    Years: 20.00    Types: Cigarettes    Quit date: 05/02/1979  . Smokeless tobacco: Current User    Types: Chew     Comment: Chew 3x / wk  . Alcohol use No     Comment: 03/09/2016 "quit before I was 76 years old"  . Drug use: No  . Sexual activity: No   Other Topics Concern  . Not on file   Social History Narrative   Lives alone, daughter lives next door. Family helps with  errands, transportation.    Allergies  Allergen Reactions  . Sulfonamide Derivatives Shortness Of Breath  . Codeine     "crazy"    Current Facility-Administered Medications  Medication Dose Route Frequency Provider Last Rate Last Dose  . acetaminophen (TYLENOL) tablet 650 mg  650 mg Oral Q6H PRN Deneise Lever, MD   650 mg at 03/21/2016 0700   Or  . acetaminophen (TYLENOL) suppository 650 mg  650 mg Rectal Q6H PRN Deneise Lever, MD      . amiodarone (PACERONE) tablet 400 mg  400 mg Oral BID Lewayne Bunting, MD   400 mg at 03/02/16 2229  . diclofenac sodium (VOLTAREN) 1 % transdermal gel 2 g  2 g Topical QID Althia Forts, MD   2 g at 03/02/16 2229  . feeding supplement (ENSURE ENLIVE) (ENSURE ENLIVE) liquid 237 mL  237 mL Oral BID BM Inez Catalina, MD   237 mL at 03/02/16 0954  . fluticasone (FLONASE) 50 MCG/ACT nasal spray 2  spray  2 spray Each Nare Daily Deneise Lever, MD   2 spray at 03/02/16 1000  . Melatonin TABS 3 mg  3 mg Oral QHS PRN Inez Catalina, MD   3 mg at 03/01/16 2122  . pantoprazole (PROTONIX) EC tablet 40 mg  40 mg Oral Daily Deneise Lever, MD   40 mg at 03/02/16 0954  . pravastatin (PRAVACHOL) tablet 40 mg  40 mg Oral Daily Deneise Lever, MD   40 mg at 03/02/16 0954  . promethazine (PHENERGAN) tablet 12.5 mg  12.5 mg Oral Q6H PRN Deneise Lever, MD      . rivaroxaban (XARELTO) tablet 20 mg  20 mg Oral Q supper Deneise Lever, MD   20 mg at 03/02/16 1716  . senna (SENOKOT) tablet 8.6 mg  1 tablet Oral BID Deneise Lever, MD   8.6 mg at 03/02/16 2229  . sodium chloride flush (NS) 0.9 % injection 3 mL  3 mL Intravenous Q12H Deneise Lever, MD   3 mL at 03/02/16 2229    ROS:   General:  No weight loss, Fever, chills  HEENT: No recent headaches, no nasal bleeding, no visual changes, no sore throat  Neurologic: No dizziness, blackouts, seizures. No recent symptoms of stroke or mini- stroke. No recent episodes of slurred speech, or temporary blindness.  Cardiac: No recent episodes of chest pain/pressure, no shortness of breath at rest.  +shortness of breath with exertion. + history of atrial fibrillation or irregular heartbeat  Vascular: No history of rest pain in feet.  No history of claudication.  No history of non-healing ulcer, No history of DVT   Pulmonary: No home oxygen, no productive cough, no hemoptysis,  No asthma or wheezing  Musculoskeletal:  [X]  Arthritis, [X]  Low back pain,  [X]  Joint pain  Hematologic:No history of hypercoagulable state.  No history of easy bleeding.  No history of anemia  Gastrointestinal: No hematochezia or melena,  No gastroesophageal reflux, no trouble swallowing  Urinary: [ ]  chronic Kidney disease, [ ]  on HD - [ ]  MWF or [ ]  TTHS, [ ]  Burning with urination, [ ]  Frequent urination, [ ]  Difficulty urinating;   Skin: No rashes  Psychological: No history of  anxiety,  No history of depression   Physical Examination  Vitals:   03/02/16 1230 03/02/16 2046 03/21/16 0429 2016-03-21 0838  BP: 97/76 96/63 (!) 133/49 99/79  Pulse: (!) 110 (!) 114 (!) 116 91  Resp: 18 18 18    Temp: 98.7  F (37.1 C) 97.8 F (36.6 C) 97.4 F (36.3 C)   TempSrc: Oral Oral Oral   SpO2: 100% 97% 97%   Weight:   226 lb 14.4 oz (102.9 kg)   Height:        Body mass index is 34.5 kg/m.  General:  Alert and oriented, no acute distress HEENT: Normal Neck: No bruit or JVD Pulmonary: Clear to auscultation bilaterally Cardiac: irregular Abdomen: Soft, non-tender, non-distended, no mass, obese Skin: No rash, hemosiderin staining gaiter area bilaterally, no ulcer Extremity Pulses:  absent femoral, popliteal dorsalis pedis, posterior tibial pulses bilaterally, feet fairly symmetric temp, right foot maybe trace cooler than left Musculoskeletal: No deformity or edema  Neurologic: Upper and lower extremity motor 5/5 and symmetric no loss of sensation  DATA:  ABI biphasic PT/DP bilaterally.  1.2 right, 1.0 left.  ASSESSMENT:  Right leg pain doubt embolic event.  He describes similar symptoms that have been ongoing for 2 years.  Pain currently resolving.  Most likely pulse exam abnormal secondary to calcification.  He has biphasic symmetric doppler flow DP/PT bilaterally on non invasive exam 1 hr ago.  Also with pt on xarelto as outpatient and last 5 days in hospital likelihood of embolic event should be low.   PLAN:  Will recheck tomorrow if no change will arrange follow up with me with arterial duplex in 4-6 weeks after his acute issues are resolved.  Most likely has some underlying element of PAD but does not appear to be having acute ischemia currently.   Fabienne Bruns, MD [facility] Office: [phone] Pager: [phone]

## 2016-03-03 NOTE — Progress Notes (Signed)
Date: Apr 02, 2016  Patient name: Darin Gonzalez  Medical record number: [id number]  Date of birth: August 11, 1940   I have personally seen and evaluated this patient and the plan of care was discussed with the house staff. Please see Dr. Henriette Combs note for complete details. I concur with his findings.   This AM, [name] was complaining of pain in his right leg from knee to ankle.  He noted that the pain started this morning.  He has had some symptoms of claudication including pain in the right calf with walking, improved with rest at home.  He further has intermittent rest pain in the right leg at home.  On exam, his ankle/foot on the right was cooler than on the left, somewhat dusky (improved with putting leg on bed from seated position) and nonpalpable DP and PT pulses.  We got a stat ABI which was reassuring, possibly false negative if calcified vessels.  Team placed patient lying flat in bed with covers over foot and the foot returned to a pink color and was similar in warmth to the right.  He further was complaining of SOB and his HR was in the 150s.  We gave him his metoprolol early.  Possibly, his limb pain is due to poor flow given elevated HR and low blood pressure in the setting of possible PVD (given history).  With quick improvement in color and warmth, I am less concerned this is critical limb ischemia at this time.  Can defer vascular consult.  Improvement in symptoms could be anticipated with cardioversion which cardiology team is planning today.  Would continue to monitor for any acute changes to the leg regarding color, temperature or pain.    Cardioversion planned today.    Inez Catalina, MD April 02, 2016, 10:16 AM

## 2016-03-04 ENCOUNTER — Inpatient Hospital Stay (HOSPITAL_COMMUNITY): Payer: Medicare HMO

## 2016-03-04 DIAGNOSIS — I469 Cardiac arrest, cause unspecified: Secondary | ICD-10-CM

## 2016-03-04 DIAGNOSIS — Z23 Encounter for immunization: Secondary | ICD-10-CM | POA: Diagnosis not present

## 2016-03-04 DIAGNOSIS — J9601 Acute respiratory failure with hypoxia: Secondary | ICD-10-CM

## 2016-03-04 DIAGNOSIS — R410 Disorientation, unspecified: Secondary | ICD-10-CM

## 2016-03-04 DIAGNOSIS — R2689 Other abnormalities of gait and mobility: Secondary | ICD-10-CM

## 2016-03-04 DIAGNOSIS — R570 Cardiogenic shock: Secondary | ICD-10-CM

## 2016-03-04 DIAGNOSIS — Z515 Encounter for palliative care: Secondary | ICD-10-CM

## 2016-03-04 DIAGNOSIS — I951 Orthostatic hypotension: Secondary | ICD-10-CM

## 2016-03-04 DIAGNOSIS — G931 Anoxic brain damage, not elsewhere classified: Secondary | ICD-10-CM

## 2016-03-04 LAB — PHOSPHORUS: PHOSPHORUS: 8.5 mg/dL — AB (ref 2.5–4.6)

## 2016-03-04 LAB — BASIC METABOLIC PANEL
Anion gap: 9 (ref 5–15)
BUN: 24 mg/dL — ABNORMAL HIGH (ref 6–20)
CHLORIDE: 102 mmol/L (ref 101–111)
CO2: 22 mmol/L (ref 22–32)
Calcium: 7.3 mg/dL — ABNORMAL LOW (ref 8.9–10.3)
Creatinine, Ser: 1.87 mg/dL — ABNORMAL HIGH (ref 0.61–1.24)
GFR calc non Af Amer: 34 mL/min — ABNORMAL LOW (ref >60)
GFR, EST AFRICAN AMERICAN: 39 mL/min — AB (ref >60)
Glucose, Bld: 86 mg/dL (ref 65–99)
POTASSIUM: 3.5 mmol/L (ref 3.5–5.1)
SODIUM: 133 mmol/L — AB (ref 135–145)

## 2016-03-04 LAB — LACTIC ACID, PLASMA: Lactic Acid, Venous: 20.1 mmol/L (ref 0.5–1.9)

## 2016-03-04 LAB — GLUCOSE, CAPILLARY
GLUCOSE-CAPILLARY: 67 mg/dL (ref 65–99)
GLUCOSE-CAPILLARY: 64 mg/dL — AB (ref 65–99)
Glucose-Capillary: 94 mg/dL (ref 65–99)
Glucose-Capillary: 78 mg/dL (ref 65–99)
Glucose-Capillary: 73 mg/dL (ref 65–99)

## 2016-03-04 LAB — POCT I-STAT 3, ART BLOOD GAS (G3+)
Acid-base deficit: 19 mmol/L — ABNORMAL HIGH (ref 0.0–2.0)
Bicarbonate: 12 mmol/L — ABNORMAL LOW (ref 20.0–28.0)
O2 Saturation: 74 %
PCO2 ART: 50 mmHg — AB (ref 32.0–48.0)
Patient temperature: 98
TCO2: 13 mmol/L (ref 0–100)
pH, Arterial: 6.985 — CL (ref 7.350–7.450)
pO2, Arterial: 59 mmHg — ABNORMAL LOW (ref 83.0–108.0)

## 2016-03-04 LAB — CBC
HCT: 32.5 % — ABNORMAL LOW (ref 39.0–52.0)
Hemoglobin: 10.3 g/dL — ABNORMAL LOW (ref 13.0–17.0)
MCH: 30.9 pg (ref 26.0–34.0)
MCHC: 31.7 g/dL (ref 30.0–36.0)
MCV: 97.6 fL (ref 78.0–100.0)
PLATELETS: 73 10*3/uL — AB (ref 150–400)
RBC: 3.33 MIL/uL — AB (ref 4.22–5.81)
RDW: 16.2 % — ABNORMAL HIGH (ref 11.5–15.5)
WBC: 4.6 10*3/uL (ref 4.0–10.5)

## 2016-03-04 LAB — MAGNESIUM
Magnesium: 1.6 mg/dL — ABNORMAL LOW (ref 1.7–2.4)
Magnesium: 3 mg/dL — ABNORMAL HIGH (ref 1.7–2.4)

## 2016-03-04 LAB — TROPONIN I: Troponin I: 0.34 ng/mL (ref <0.03)

## 2016-03-04 MED ORDER — SODIUM CHLORIDE 0.9 % IV SOLN
INTRAVENOUS | Status: AC
  Administered 2016-03-04: 14:00:00 via INTRAVENOUS

## 2016-03-04 MED ORDER — AMIODARONE HCL 200 MG PO TABS
400.0000 mg | ORAL_TABLET | Freq: Two times a day (BID) | ORAL | Status: DC
  Filled 2016-03-04: qty 2

## 2016-03-04 MED ORDER — NOREPINEPHRINE BITARTRATE 1 MG/ML IV SOLN
0.0000 ug/min | INTRAVENOUS | Status: DC
  Administered 2016-03-04: 40 ug/min via INTRAVENOUS
  Filled 2016-03-04: qty 4

## 2016-03-04 MED ORDER — NOREPINEPHRINE BITARTRATE 1 MG/ML IV SOLN
0.0000 ug/min | INTRAVENOUS | Status: DC
  Filled 2016-03-04: qty 16

## 2016-03-04 MED ORDER — IBUPROFEN 600 MG PO TABS
600.0000 mg | ORAL_TABLET | Freq: Three times a day (TID) | ORAL | Status: DC | PRN
  Administered 2016-03-04: 600 mg via ORAL
  Filled 2016-03-04: qty 1

## 2016-03-04 MED ORDER — ASPIRIN 300 MG RE SUPP: 300.0000 mg | RECTAL | Status: DC

## 2016-03-04 MED ORDER — EPINEPHRINE PF 1 MG/ML IJ SOLN
0.5000 ug/min | INTRAVENOUS | Status: DC
  Filled 2016-03-04 (×2): qty 4

## 2016-03-04 MED ORDER — IBUPROFEN 200 MG PO TABS: 400.0000 mg | ORAL_TABLET | Freq: Two times a day (BID) | ORAL | Status: DC | PRN

## 2016-03-04 MED ORDER — TRAMADOL HCL 50 MG PO TABS
50.0000 mg | ORAL_TABLET | Freq: Once | ORAL | Status: AC
  Administered 2016-03-04: 50 mg via ORAL
  Filled 2016-03-04: qty 1

## 2016-03-04 MED ORDER — DEXTROSE 50 % IV SOLN
INTRAVENOUS | Status: AC
  Administered 2016-03-04: 25 mL via INTRAVENOUS
  Filled 2016-03-04: qty 50

## 2016-03-04 MED ORDER — DEXTROSE 50 % IV SOLN
25.0000 mL | Freq: Once | INTRAVENOUS | Status: AC
  Administered 2016-03-04: 25 mL via INTRAVENOUS

## 2016-03-04 MED ORDER — MAGNESIUM SULFATE 2 GM/50ML IV SOLN: 2.0000 g | Freq: Once | INTRAVENOUS | Status: DC

## 2016-03-04 MED ORDER — SODIUM CHLORIDE 0.9 % IV BOLUS (SEPSIS): 1000.0000 mL | Freq: Once | INTRAVENOUS | Status: DC

## 2016-03-04 MED ORDER — STERILE WATER FOR INJECTION IV SOLN
INTRAVENOUS | Status: DC
  Administered 2016-03-04: 21:00:00 via INTRAVENOUS
  Filled 2016-03-04 (×2): qty 850

## 2016-03-04 MED ORDER — SODIUM CHLORIDE 0.9 % IV SOLN
3.0000 g | Freq: Three times a day (TID) | INTRAVENOUS | Status: DC
  Filled 2016-03-04 (×2): qty 3

## 2016-03-04 MED ORDER — RIVAROXABAN 15 MG PO TABS
15.0000 mg | ORAL_TABLET | Freq: Every day | ORAL | Status: DC
  Filled 2016-03-04: qty 1

## 2016-03-04 MED ORDER — NOREPINEPHRINE BITARTRATE 1 MG/ML IV SOLN
0.0000 ug/min | INTRAVENOUS | Status: DC
  Administered 2016-03-04: 60 ug/min via INTRAVENOUS
  Filled 2016-03-04: qty 4

## 2016-03-04 MED ORDER — SODIUM CHLORIDE 0.9 % IV BOLUS (SEPSIS)
1000.0000 mL | Freq: Once | INTRAVENOUS | Status: AC
  Administered 2016-03-04: 1000 mL via INTRAVENOUS

## 2016-03-04 MED ORDER — FAMOTIDINE IN NACL 20-0.9 MG/50ML-% IV SOLN
20.0000 mg | INTRAVENOUS | Status: DC
  Filled 2016-03-04: qty 50

## 2016-03-04 MED ORDER — POLYETHYLENE GLYCOL 3350 17 G PO PACK
17.0000 g | PACK | Freq: Every day | ORAL | Status: DC
  Administered 2016-03-04: 17 g via ORAL
  Filled 2016-03-04: qty 1

## 2016-03-04 MED ORDER — RIVAROXABAN 15 MG PO TABS: 15.0000 mg | ORAL_TABLET | Freq: Every day | ORAL | Status: DC

## 2016-03-04 MED ORDER — SODIUM CHLORIDE 0.9 % IV SOLN: INTRAVENOUS | Status: DC

## 2016-03-04 MED ORDER — MAGNESIUM OXIDE 400 (241.3 MG) MG PO TABS
400.0000 mg | ORAL_TABLET | Freq: Once | ORAL | Status: AC
  Administered 2016-03-04: 400 mg via ORAL
  Filled 2016-03-04: qty 1

## 2016-03-04 MED ORDER — ORAL CARE MOUTH RINSE: 15.0000 mL | Freq: Two times a day (BID) | OROMUCOSAL | Status: DC

## 2016-03-04 MED ORDER — MORPHINE SULFATE (PF) 2 MG/ML IV SOLN: 1.0000 mg | Freq: Once | INTRAVENOUS | Status: DC

## 2016-03-04 MED ORDER — ASPIRIN 81 MG PO CHEW: 324.0000 mg | CHEWABLE_TABLET | ORAL | Status: DC

## 2016-03-04 MED ORDER — SODIUM CHLORIDE 0.9 % IV SOLN: 250.0000 mL | INTRAVENOUS | Status: DC | PRN

## 2016-03-05 ENCOUNTER — Ambulatory Visit (HOSPITAL_COMMUNITY): Payer: Medicare HMO

## 2016-03-05 LAB — BASIC METABOLIC PANEL
ANION GAP: 25 — AB (ref 5–15)
BUN: 29 mg/dL — ABNORMAL HIGH (ref 6–20)
CHLORIDE: 102 mmol/L (ref 101–111)
CO2: 12 mmol/L — AB (ref 22–32)
Calcium: 6 mg/dL — CL (ref 8.9–10.3)
Creatinine, Ser: 2.52 mg/dL — ABNORMAL HIGH (ref 0.61–1.24)
GFR calc Af Amer: 27 mL/min — ABNORMAL LOW (ref >60)
GFR calc non Af Amer: 23 mL/min — ABNORMAL LOW (ref >60)
GLUCOSE: 157 mg/dL — AB (ref 65–99)
POTASSIUM: 5 mmol/L (ref 3.5–5.1)
Sodium: 139 mmol/L (ref 135–145)

## 2016-03-05 LAB — CK: Total CK: 1160 U/L — ABNORMAL HIGH (ref 49–397)

## 2016-03-05 LAB — PHOSPHORUS: Phosphorus: 8.5 mg/dL — ABNORMAL HIGH (ref 2.5–4.6)

## 2016-03-05 MED FILL — Medication: Qty: 2 | Status: AC

## 2016-03-05 NOTE — Progress Notes (Signed)
Patient DNR and went into [facility]A.  [name] at bedside and called time of death at 2330 on 02/27/2016.  [name] verified no heartbeat with ultrasound.  I Banker(RN) also verified.

## 2016-03-10 ENCOUNTER — Telehealth: Payer: Self-pay

## 2016-03-10 DIAGNOSIS — J4520 Mild intermittent asthma, uncomplicated: Secondary | ICD-10-CM | POA: Diagnosis not present

## 2016-03-10 NOTE — Telephone Encounter (Signed)
On 03/10/2016 I received a death certificate from [facility] ([facility]). The death certificate is for cremation. The patient is a patient of Doctor [name]. The death certificate will be taken to [employer] this pm for signature. On 03/11/2016 I received the death certificate back from Doctor [name]. I got the death certificate ready and called the funeral home to let them know the death certificate is ready for pickup. I also faxed a copy to the funeral home.

## 2016-03-10 NOTE — Discharge Summary (Signed)
NAMWinfred Leeds:  Bolick, Sachit               ACCOUNT NO.:  [id number]  MEDICAL RECORD NO.:  [id number]  LOCATION:  3E09C                        FACILITY:  [facility]  PHYSICIAN:  Felipa EvenerWesam Jake [name], MD  DATE OF BIRTH:  1940/10/23  DATE OF ADMISSION:  03/03/2016 DATE OF DISCHARGE:                              DISCHARGE SUMMARY   PRIMARY DIAGNOSIS/CAUSE OF DEATH:  Pulseless electric activity cardiac arrest.  SECONDARY DIAGNOSES:  Refractory cardiogenic shock, metabolic acidosis with acute respiratory failure, cardiogenic shock, acute on chronic renal failure, anoxic brain injury, and aspiration pneumonia.  The patient is a 76 year old male with __________ past medical history who suffered an in-hospital cardiac arrest, __________ intensive care unit with __________ consultation for medical management.  In the intensive care unit, the patient had a repeat cardiac arrest with 45- minute resuscitation, developed acute refractory cardiogenic shock, was not responsive to pressor.  We had an extensive discussion with the family and informed that the patient has no reasonable chance of recovery, at which point the patient was made do not resuscitate, but continued medical care.  In the intensive care unit, arterial line was placed.  Post insertion, the patient was noted to be pulseless.  Given previous discussion about do not resuscitate status, the patient was declared dead and the family was notified and condolences were given.     Felipa EvenerWesam Jake [name], MD     WJY/MEDQ  D:  03/10/2016  T:  03/10/2016  Job:  [id number]

## 2016-03-25 LAB — ECHO TEE
Reg peak vel: 164 cm/s
TR max vel: 164 cm/s

## 2016-03-27 NOTE — Progress Notes (Signed)
Date: 03-09-16  Patient name: Darin Gonzalez  Medical record number: [id number]  Date of birth: [birth year]   I have personally seen and evaluated this patient and I have discussed the plan of care with the house staff. Please see Dr. Henriette Combs note for complete details. I concur with his findings.   Darin Gonzalez was more confused this morning.  He was oriented to person/time/place, but he kept asking to "go back to bed" even though he was lying in bed.  It was a mild delerium when we saw him.  Likely causes would be acute renal failure, which was noted on AM labs (fluids being given) and some mild uremia vs. Anesthesia effects vs. Related to medications given (Tramadol).  Would hold sedatives.  Given low dose of fluids given his CHF.    He is complaining of knee pain.  On exam, he has difficulty with flexing the right knee.  He also has a small knee effusion and possibly a baker's cyst.  We got an xray of the knee showing severe tricompartmental disease.  He also has calf pain which he notes has been going on for years.  Very uncomfortable for him.  Neuro exam was non focal except the mild confusion.  He is weak based on PT evaluations.    Given worsening renal function, I decreased the ibuprofen to 400mg  q12 hours only.  Hold all sedatives.  I put a stop time in for fluids (only 5 hours).  Check a BMET this afternoon.    Inez Catalina, MD 03-09-16, 2:32 PM

## 2016-03-27 NOTE — Clinical Social Work Placement (Signed)
CLINICAL SOCIAL WORK PLACEMENT  NOTE  Date:  03/21/2016  Patient Details  Name: Darin Gonzalez MRN: [id number] Date of Birth: 08/24/1940  Clinical Social Work is seeking post-discharge placement for this patient at the Skilled  Nursing Facility level of care (*CSW will initial, date and re-position this form in  chart as items are completed):  Yes   Patient/family provided with [facility] Clinical Social Work Department's list of facilities offering this level of care within the geographic area requested by the patient (or if unable, by the patient's family).  Yes   Patient/family informed of their freedom to choose among providers that offer the needed level of care, that participate in Medicare, Medicaid or managed care program needed by the patient, have an available bed and are willing to accept the patient.  Yes   Patient/family informed of [facility]'s ownership interest in [facility] and [facility], as well as of the fact that they are under no obligation to receive care at these facilities.  PASRR submitted to EDS on 03/12/2016     PASRR number received on 03/25/2016     Existing PASRR number confirmed on       FL2 transmitted to all facilities in geographic area requested by pt/family on 02/26/2016     FL2 transmitted to all facilities within larger geographic area on       Patient informed that his/her managed care company has contracts with or will negotiate with certain facilities, including the following:            Patient/family informed of bed offers received.  Patient chooses bed at       Physician recommends and patient chooses bed at      Patient to be transferred to   on  .  Patient to be transferred to facility by       Patient family notified on   of transfer.  Name of family member notified:        PHYSICIAN Please sign FL2     Additional Comment:    _______________________________________________ Margarito Liner, LCSW 02/28/2016, 3:59  PM

## 2016-03-27 NOTE — Progress Notes (Signed)
Patient on 3E with AMS, rapid response was called. Pateint lost pulse, code blue was called.  Chest compression in process as patient arrived to the unit.   Total meds given during code: 8 Epi given 6 amps of Sodium Bicarb given 1gm Magneism 1amp D 50 Patient gained pulse at 2044 NP spoke with family and made patient DNR

## 2016-03-27 NOTE — NC FL2 (Signed)
[place] MEDICAID FL2 LEVEL OF CARE SCREENING TOOL     IDENTIFICATION  Patient Name: Darin LimboJimmy A Mixon Birthdate: 07/30/1940 Sex: male Admission Date (Current Location): 03/07/2016  [facility] [place]   Facility and Address:  [facility] [place], [address], [place], [place] [address]      Provider Number: [id number]  Attending Physician Name and Address:  Inez CatalinaEmily B Mullen, MD  Relative Name and Phone Number:       Current Level of Care: Hospital Recommended Level of Care: Skilled Nursing Facility Prior Approval Number:    Date Approved/Denied:   PASRR Number: [id number]  Discharge Plan: SNF    Current Diagnoses: Patient Active Problem List   Diagnosis Date Noted  . Confusion   . Imbalance   . Syncope   . Pacemaker   . Arterial hypotension   . Longstanding persistent atrial fibrillation (HCC)   . Other fatigue   . Elevated CK   . Elevated serum creatinine   . Elevated troponin   . Neck mass   . Dehydration 03/25/2016  . PAF (paroxysmal atrial fibrillation) (HCC) 08/04/2015  . Complete heart block by electrocardiogram (HCC) 04/30/2015  . CHB (complete heart block) (HCC) 04/30/2015  . Complete heart block (HCC) 04/30/2015  . Hypertension   . Diabetes mellitus without complication (HCC)   . Asthma with acute exacerbation 04/06/2014  . Diabetes mellitus type 2, noninsulin dependent (HCC) 03/05/2007  . Essential hypertension 03/05/2007  . GERD 03/05/2007  . BACK PAIN, CHRONIC 03/05/2007  . SEIZURE DISORDER 03/05/2007    Orientation RESPIRATION BLADDER Height & Weight     Self, Time, Situation, Place  Normal Continent Weight: 226 lb 14.4 oz (102.9 kg) Height:  5\' 8"  (172.7 cm)  BEHAVIORAL SYMPTOMS/MOOD NEUROLOGICAL BOWEL NUTRITION STATUS   (None) Convulsions/Seizures Continent Diet (Heart healthy)  AMBULATORY STATUS COMMUNICATION OF NEEDS Skin   Total Care Verbally Skin abrasions, Bruising, Other (Comment) (Skin tear)                       Personal Care Assistance Level of Assistance  Bathing, Feeding, Dressing Bathing Assistance: Limited assistance Feeding assistance: Limited assistance Dressing Assistance: Limited assistance     Functional Limitations Info  Sight, Hearing, Speech Sight Info: Adequate Hearing Info: Adequate Speech Info: Adequate    SPECIAL CARE FACTORS FREQUENCY  PT (By licensed PT), Blood pressure, Diabetic urine testing, OT (By licensed OT)     PT Frequency: 5 x week OT Frequency: 3 x week            Contractures Contractures Info: Not present    Additional Factors Info  Code Status, Allergies Code Status Info: Full Allergies Info: Sulfonamide Derivatives, Codeine           Current Medications (03/07/2016):  This is the current hospital active medication list Current Facility-Administered Medications  Medication Dose Route Frequency Provider Last Rate Last Dose  . 0.9 %  sodium chloride infusion   Intravenous Continuous Inez CatalinaEmily B Mullen, MD 100 mL/hr at 03/12/2016 1414    . acetaminophen (TYLENOL) tablet 650 mg  650 mg Oral Q6H PRN Deneise LeverParth Saraiya, MD   650 mg at 03/17/2016 0006   Or  . acetaminophen (TYLENOL) suppository 650 mg  650 mg Rectal Q6H PRN Deneise LeverParth Saraiya, MD      . amiodarone (PACERONE) tablet 400 mg  400 mg Oral BID Lewayne BuntingBrian S Crenshaw, MD   400 mg at 03/01/2016 1118  . diclofenac  sodium (VOLTAREN) 1 % transdermal gel 2 g  2 g Topical QID Althia Forts, MD   2 g at 2016/03/07 1414  . feeding supplement (ENSURE ENLIVE) (ENSURE ENLIVE) liquid 237 mL  237 mL Oral BID BM Inez Catalina, MD   237 mL at 03/02/16 0954  . fluticasone (FLONASE) 50 MCG/ACT nasal spray 2 spray  2 spray Each Nare Daily Deneise Lever, MD   2 spray at 2016/03/07 1120  . ibuprofen (ADVIL,MOTRIN) tablet 400 mg  400 mg Oral Q12H PRN Inez Catalina, MD      . Melatonin TABS 3 mg  3 mg Oral QHS PRN Inez Catalina, MD   3 mg at March 07, 2016 0019  . pantoprazole (PROTONIX) EC tablet 40 mg  40 mg Oral Daily  Deneise Lever, MD   40 mg at 2016-03-07 1119  . polyethylene glycol (MIRALAX / GLYCOLAX) packet 17 g  17 g Oral Daily Deneise Lever, MD   17 g at 2016/03/07 1418  . pravastatin (PRAVACHOL) tablet 40 mg  40 mg Oral Daily Deneise Lever, MD   40 mg at 03/07/16 1118  . promethazine (PHENERGAN) tablet 12.5 mg  12.5 mg Oral Q6H PRN Deneise Lever, MD      . Rivaroxaban (XARELTO) tablet 15 mg  15 mg Oral Q supper Chilton Si, MD      . senna [facility]) tablet 8.6 mg  1 tablet Oral BID Deneise Lever, MD   8.6 mg at 03/07/2016 1117  . sodium chloride flush (NS) 0.9 % injection 3 mL  3 mL Intravenous Q12H Deneise Lever, MD   3 mL at 03/07/2016 1119     Discharge Medications: Please see discharge summary for a list of discharge medications.  Relevant Imaging Results:  Relevant Lab Results:   Additional Information SS#: [id number]  Margarito Liner, LCSW

## 2016-03-27 NOTE — Progress Notes (Signed)
Pt family requesting to have pacerone stop.  Family  think making him sleepy and confused.  Paged Dr. Criselda PeachesMullen to talk with family. MD return call and on phone with family.  Amanda PeaNellie Arissa Fagin, Charity fundraiserN.

## 2016-03-27 NOTE — Progress Notes (Signed)
Subjective: Mr. Edgecombe underwent TEE/DCCV yesterday afternoon and was cardioverted to NSR, has returned to atrial fibrillation on telemetry with intermittent episodes of tachycardia with inadequate pacing.   Was complaining of persistent right leg pain overnight and agitated. Received Tramadol at 2 AM and also has had worsening confusion this morning. Had hypoglycemia to 67. Cannot recall happened last night but apparently called 911 a few times. Confused this morning on rounds, is awake and oriented x4, but keeps stating he wants to lay down in bed (from a supine condition in bed) and continues to endorse right knee and lower leg pain. Concern for overall decline, generalized weakness, and new left arm weakness. PT/OT now recommending SNF / 24 hr supervision/assist.  Objective: Vital signs in last 24 hours: Vitals:   03/13/2016 1905 03/01/2016 2353 03/05/16 0140 03-05-2016 0437  BP: 106/74 118/60 (!) 98/56 102/66  Pulse:  95 89 (!) 108  Resp:  20  18  Temp:  98 F (36.7 C)  98.5 F (36.9 C)  TempSrc:  Oral  Rectal  SpO2:  98%  100%  Weight:      Height:        Intake/Output Summary (Last 24 hours) at 05-Mar-2016 0900 Last data filed at 03-05-2016 0834  Gross per 24 hour  Intake              600 ml  Output                0 ml  Net              600 ml   Physical Exam Physical Exam  Constitutional: He is oriented to person, place, and time. He appears well-developed. He appears distressed.  Moaning, reports fatigue, pain, and dyspnea  HENT:  Head: Normocephalic.  Mouth/Throat: Oropharynx is clear and moist.  Eyes: EOM are normal. Pupils are equal, round, and reactive to light.  Cardiovascular: Intact distal pulses.  An irregularly irregular rhythm present. Exam reveals no friction rub.   No murmur heard. Distant heart sounds  Pulmonary/Chest: Breath sounds normal. No respiratory distress. He has no wheezes. He has no rales.  Abdominal: Soft. He exhibits distension (mild). There is no  tenderness.  Musculoskeletal: He exhibits tenderness. He exhibits no edema or deformity.       Right knee: He exhibits decreased range of motion, swelling and effusion. He exhibits no erythema. Tenderness found. Medial joint line and lateral joint line tenderness noted.       Legs: Palpable fullness behind right knee tender to touch, palpable effusion present compared to left knee  Neurological: He is alert and oriented to person, place, and time. No cranial nerve deficit or sensory deficit. He exhibits normal muscle tone. Coordination normal.  4/5 strength in LUE, 4/5 proximal strength in BLEs, otherwise 5/5 throughout No neglect or loss of proprioception bilaterally Visual fields grossly intact bilaterally  Skin: Skin is dry. No erythema. No pallor.  Hyperpigmentation over lower legs  Vitals reviewed.  Labs / Imaging / Procedures: CBC Latest Ref Rng & Units 02/28/2016 03/10/2016 03/16/2016  WBC 4.0 - 10.5 K/uL 8.8 - 11.4(H)  Hemoglobin 13.0 - 17.0 g/dL 12.8(L) 13.6 13.9  Hematocrit 39.0 - 52.0 % 38.3(L) 40.0 41.2  Platelets 150 - 400 K/uL 177 - 200   BMP Latest Ref Rng & Units 03-05-16 03/24/2016 03/02/2016  Glucose 65 - 99 mg/dL 86 115(H) 127(H)  BUN 6 - 20 mg/dL 24(H) 15 12  Creatinine 0.61 - 1.24 mg/dL 1.87(H)  1.35(H) 1.31(H)  Sodium 135 - 145 mmol/L 133(L) 131(L) 132(L)  Potassium 3.5 - 5.1 mmol/L 3.5 4.0 3.9  Chloride 101 - 111 mmol/L 102 100(L) 104  CO2 22 - 32 mmol/L 22 22 20(L)  Calcium 8.9 - 10.3 mg/dL 7.3(L) 7.1(L) 7.3(L)   No results found.  TEE 03/11/2016 via Dr. Aundra Dubin  Findings:  Please see echo section for full report.  The patient was in atrial fibrillation.  Normal LV size and thickness, EF 55%.  Mildly dilated RV with normal systolic function.  PPM in RV.  There was trivial TR. There was trivial MR.  Mildly calcified, trileaflet aortic valve with no stenosis or regurgitation.  Moderate biatrial enlargement.  No LAA thrombus.  No PFO/ASD by color doppler.  Normal caliber  thoracic aorta with minimal plaque.  Small pericardial effusion.   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  Worsening confusion, new LUE weakness, concerning for acute intracranial event. Delirium possibly due to medication effect (Tramadol), hypoglycemia, poor po intake, lack of sleep.  - Follow results of stat CT head wo contrast  Right knee and leg pain, worsened on 1/8, reports this pain has been going on for months to years - although he certainly was not having pain here previously during his hospital stay. Initial concern was for acute limb ischemia, but normal ABIs but concerning for heavily calcified vessels, VVS evaluated and not impressed. No leg swelling or calf tenderness to palpation, not suggestive of DVT. Right knee apparently more involved on 1/9, palpable fullness of posterior aspect of knee with palpable effusion.  - Obtain left knee XR, consider ultrasound later today - Added Ibuprofen 600 mg TID prn - Tylenol 650 mg Q6 prn  PAF and CHB s/p pacemaker, CHADSVASC score 7, in AF. Started on Metoprolol and Amiodarone this admission. Tele today shows AF with intermittent RVR to 140s. TEE/DCCV on 1/8 temporary return to NSR but now back in Afib with persistent tachyarrhythmias.  - Amiodarone 461m BID (reduce to 200 mg daily around 03/16/16) - Appreciate cardiology recommendations - Metoprolol XR 12.5 mg QD, defer potential increase to 25 mg given hypotension - Xarelto 20 mg daily - Telemetry  Fall, vs syncope - unclear if mechanical fall vs syncope, coincided with profound weakness and remained on floor for 12 hours, having persistent weakness and hypotension, also with poor po intake. History of CHB with pacemaker and PAF, on Xarelto - CT head, c-spine, hip XR with no acute abnml/fracture. TTE shows EF 45-50%. NM myocar shows intermediate  risk.  Mild elevated ESR and CRP - may have underlying inflammatory condition - may need further evaluation. Persistent poorly-controled Atrial fibrillation with episodes of RVR and hypomagnesemia could be contributing to fatigue as well.  - Telemetry - PT OT eval/treat - recommending HH PTOT and intermittent supervision - Tylenol PRN for aches/pains - Voltaren gel PRN for aches/pains  Elevated Troponin,peaked at 0.31 >>0.19. Denied any chest pain, stuck on floor for 12 hours after a fall, resulting in mild rhabdo/elevated CK to 1000, possible source. Also with intermittent AF RVR which may be resulting in demand ischemia. - Medical therapy per cardiology recs  Chronic systolic and diastolic heart failure, LVEF 45-50% with inferobasilar hypokinesis found on TTE 1/5. Intermediate risk NM study on 1/6. Had mild troponin elevation to 0.31 on admission. TEE on 1/8 showed LVEF 55%.  - On  beta blocker, hold of on starting ACE inhibitor given ongoing hypotension - Appreciate cardiology recommendations  HypoMag, requiring oral and IV supplementation, episode of polymorphic VT on 1/6:  - Mag oxide 400 mg today - Trend Mg daily  AKI, creatinine 1.87 from 1.35 yesterday - possibly due to poor po intake, dehydration in setting of prolonged NPO yesterday before TEE/DCCV. Mild persistent hypotension around 100/60 similar to previous during stay - Trial IV fluid rehydration today - 100 cc/hr - Trend BMP  GERD - continue home Protonix HLD - continue home Pravachol  Dispo: Anticipated discharge in approximately 1-2 days.  LOS: 5 days   Asencion Partridge, MD 2016/03/21, 9:00 AM Pager: [phone]

## 2016-03-27 NOTE — Progress Notes (Signed)
Pt noted to be diaphoretic.  BS checked 64mg /dl and given 1/2 an am of D50 given via iv by CN.  Rapid response notified also.  Dr. Vincente LibertyMolt notified of BS 64mg /dl.  At 1858.Instructed someone will be up to see pt.

## 2016-03-27 NOTE — Progress Notes (Signed)
Therapy team called back to room for assist for patient back to bed as patient unable to tolerate OOB to chair at this time. Patient remains confused and extremely weak during session. Assisted patient back to bed as a total assist of 2 for a draw sheet lateral transfer. Once in bed, positioned patient on his side for pressure relief. Spoke with patients family at length regarding their concerns re: patient current functional status. Advised them that LCSW would be able to provide bed options once initiated re: SNF. Family receptive and appreciative.    03/21/2016 1100  PT Visit Information  Last PT Received On 03/06/2016  Assistance Needed +2  Reason for Co-Treatment For patient/therapist safety  OT goals addressed during session Other (comment) (transfer)  History of Present Illness Pt adm after fall at home. Pt found to be hypotensive and dehydrated. PMH - DM, HTN, afib, pacer  Subjective Data  Patient Stated Goal agrees he need rehab  Precautions  Precautions Fall  Pain Assessment  Pain Assessment Faces  Faces Pain Scale 6  Pain Location B LEs, buttocks  Pain Descriptors / Indicators Aching;Sharp;Guarding;Grimacing  Pain Intervention(s) Monitored during session;Repositioned  Cognition  Arousal/Alertness Awake/alert  Behavior During Therapy WFL for tasks assessed/performed;Anxious  Overall Cognitive Status Impaired/Different from baseline  Area of Impairment Memory;Problem solving  Memory Decreased short-term memory  Problem Solving Slow processing;Decreased initiation;Difficulty sequencing;Requires verbal cues;Requires tactile cues  General Comments pt with poor immediate memory, repeated calling for help without using call button  Bed Mobility  Overal bed mobility Needs Assistance  Bed Mobility Rolling  Rolling Mod assist  General bed mobility comments positioned pt on L side in bed with pillow behind back and between legs  Transfers  Overall transfer level Needs assistance   General transfer comment assisted pt back to bed due to inability to tolerate chair, used draw sheet to assist pt from reclined chair to bed  PT - Assessment/Plan  PT Plan Discharge plan needs to be updated  PT Frequency (ACUTE ONLY) Min 3X/week  Follow Up Recommendations SNF;Supervision/Assistance - 24 hour  PT equipment Rolling walker with 5" wheels  PT Goal Progression  Progress towards PT goals Not progressing toward goals - comment  Acute Rehab PT Goals  PT Goal Formulation With patient/family  Time For Goal Achievement 03/06/16  Potential to Achieve Goals Good  PT Time Calculation  PT Start Time (ACUTE ONLY) 0940  PT Stop Time (ACUTE ONLY) 1000  PT Time Calculation (min) (ACUTE ONLY) 20 min + for return visit upon family arrival to discuss their concerns WU:[id number] planning  PT General Charges  $$ ACUTE PT VISIT 1 Procedure  PT Treatments  $Therapeutic Activity 8-22 mins    Charlotte Crumb, PT DPT  [phone]

## 2016-03-27 NOTE — Progress Notes (Signed)
Attempted to place R femoral arterial line. Artery was difficult to access, and once accessed unable to advance guidewire. Attempts aborted. Very high vasopressor requirement making this difficult.   Joneen RoachPaul Scheryl Sanborn, AGACNP-BC [facility] Pager [phone] or [phone]  03/07/2016 11:10 PM

## 2016-03-27 NOTE — Significant Event (Signed)
Rapid Response Event Note  Overview: Time Called: 1905 Arrival Time: 1910 Event Type: Neurologic  Initial Focused Assessment: Per Family and Staff patient has been confused over the past few days.  He was admitted post a fall at home with rabdo.   This evening he is less responsive. Patient responding with moans and brief word BP 107/64  HR 118 AF  RR 36  O2 sat 99% on 2l [place] Lung sounds clear, heart tones irregular. Skin is cool and clammy. CBG 64, given 1/2 amp D50 CBG 73 MD notified again  Interventions: Attempting to draw ABG Lab at bedside to draw labs NS bolus started Patient  Lost pulse CPR started Intubated during Code Blue Transported to 2M04,  Lost pulse again during transport, CPR started  Plan of Care (if not transferred):  Event Summary: Name of Physician Notified: Resident service at 1905    at    Outcome: Transferred (Comment)  Event End Time: 2130  Marcellina MillinLayton, Redford Behrle

## 2016-03-27 NOTE — Progress Notes (Signed)
After inserting a-line, patient lost his pulse.  Patient was established as DNR.  After complete loss of pulse, patient was declared at 11:30 PM.  Family was called, brought in the conference room and informed of the events of the night.  Condolences given and family was taken bedside.  Family requested we remove the ETT for him to look natural for when the rest of the family arrives.  Given that this was not an ME case will extubate per family's request.  The patient is critically ill with multiple organ systems failure and requires high complexity decision making for assessment and support, frequent evaluation and titration of therapies, application of advanced monitoring technologies and extensive interpretation of multiple databases.   Critical Care Time devoted to patient care services described in this note is  35  Minutes. This time reflects time of care of this signee Dr Koren BoundWesam [name]. This critical care time does not reflect procedure time, or teaching time or supervisory time of PA/NP/Med student/Med Resident etc but could involve care discussion time.  Alyson ReedyWesam G. [name], M.D. [facility]. Pager: [phone]. After hours pager: [phone].

## 2016-03-27 NOTE — Progress Notes (Signed)
Occupational Therapy Treatment Patient Details Name: Darin Gonzalez MRN: [id number] DOB: [birth year] Today's Date: 03/06/2016    History of present illness Pt adm after fall at home. Pt found to be hypotensive and dehydrated. PMH - DM, HTN, afib, pacer   OT comments  Pt with significant change in all areas of mobility. Required 2 person assist for OOB mobility. Impaired sitting balance with leaning to R and posterior. Pt with confusion and increased LE pain with inability to stand. L UE weaker than R, but pt generally profoundly weak. MD and RN aware.  Follow Up Recommendations  SNF;Supervision/Assistance - 24 hour    Equipment Recommendations  3 in 1 bedside commode    Recommendations for Other Services      Precautions / Restrictions Precautions Precautions: Fall Restrictions Weight Bearing Restrictions: No       Mobility Bed Mobility Overal bed mobility: Needs Assistance Bed Mobility: Rolling;Supine to Sit Rolling: Mod assist   Supine to sit: Max assist;+2 for physical assistance;HOB elevated     General bed mobility comments: mod to roll for positioning and pericare, assist for all aspects of supine to sit  Transfers Overall transfer level: Needs assistance   Transfers: Lateral/Scoot Transfers Sit to Stand:  (attempted but could not perform at this time)        Lateral/Scoot Transfers: +2 safety/equipment;Max assist General transfer comment: pt unable to stand, performed lateral scoot with momentum and use of pad    Balance Overall balance assessment: Needs assistance Sitting-balance support: No upper extremity supported Sitting balance-Leahy Scale: Poor Sitting balance - Comments: patient could not maintain midline today, right lateral lean noted throughout session. patient required multiple cues to direct attention to LUE for support. patient with complete fall back to the bed from sitting (right and posterior) wth inability to engage and correct. Min to  moderate assist for balance control throughout EOB. Postural control: Right lateral lean Standing balance support: Bilateral upper extremity supported Standing balance-Leahy Scale: Zero Standing balance comment: could not elevate to standing today                   ADL Overall ADL's : Needs assistance/impaired     Grooming: Wash/dry hands;Brushing hair;Sitting;Moderate assistance   Upper Body Bathing: Maximal assistance;Sitting       Upper Body Dressing : Maximal assistance;Sitting   Lower Body Dressing: Total assistance;Bed level               Functional mobility during ADLs:  (pt unable to ambulate) General ADL Comments: Pt distracted by pain in LEs and buttocks. Pt agrees he is unsafe to go home with his family's assist.      Vision                     Perception     Praxis      Cognition   Behavior During Therapy: [facility] for tasks assessed/performed;Anxious Overall Cognitive Status: Impaired/Different from baseline Area of Impairment: Memory;Problem solving     Memory: Decreased short-term memory        Problem Solving: Slow processing;Decreased initiation;Difficulty sequencing;Requires verbal cues;Requires tactile cues General Comments: pt with poor immediate memory, repeated calling for help without using call button    Extremity/Trunk Assessment               Exercises     Shoulder Instructions       General Comments      Pertinent Vitals/ Pain  Pain Assessment: Faces Faces Pain Scale: Hurts even more Pain Location: B LEs Pain Descriptors / Indicators: Aching;Sharp;Guarding;Grimacing Pain Intervention(s): Monitored during session;Repositioned  Home Living                                          Prior Functioning/Environment              Frequency  Min 2X/week        Progress Toward Goals  OT Goals(current goals can now be found in the care plan section)  Progress towards OT goals:  Not progressing toward goals - comment (increased weakness, confusion and pain)  Acute Rehab OT Goals Patient Stated Goal: agrees he need rehab OT Goal Formulation: With patient Time For Goal Achievement: 03/13/16 Potential to Achieve Goals: Good  Plan Discharge plan needs to be updated    Co-evaluation    PT/OT/SLP Co-Evaluation/Treatment: Yes Reason for Co-Treatment: For patient/therapist safety PT goals addressed during session: Mobility/safety with mobility OT goals addressed during session: ADL's and self-care      End of Session Equipment Utilized During Treatment: Gait belt;Rolling walker;Oxygen   Activity Tolerance Patient limited by pain   Patient Left in bed;with call bell/phone within reach;with bed alarm set   Nurse Communication Mobility status (change in status, MD also notified)        Time: 2725-3664 OT Time Calculation (min): 36 min  Charges: OT General Charges $OT Visit: 1 Procedure OT Treatments $Self Care/Home Management : 8-22 mins  Evern Bio Mar 25, 2016, 11:09 AM  [phone]

## 2016-03-27 NOTE — Progress Notes (Signed)
Physical Therapy Treatment Patient Details Name: Darin Gonzalez MRN: [id number] DOB: 30-Aug-1940 Today's Date: 2016/03/07    History of Present Illness Pt adm after fall at home. Pt found to be hypotensive and dehydrated. PMH - DM, HTN, afib, pacer    PT Comments    Patient seen in conjunction with OT therapist. At this time, patient continues to demonstrate decline in functional status.  Patient noted to have inability to maintain static sitting balance, patient with increased weakness. Performed general neuro screen. Patient with + LUE drift, some inattention to the left during functional task performance. No other focal symptoms seen at this time. Paged MD regarding concerns relating to continuous decline in mobility and function. Patient was walking 150 ft min guard on the 4th, patient now a 2 person max to total assist for basic OOB to chair transfer and un able to ambulate or maintain sitting balance at this time. Given current functional status, no longer appropriate for d/c home with therapy, recommendations changed to SNF vs rehab pending findings of further assessment.  Follow Up Recommendations  SNF;Supervision/Assistance - 24 hour     Equipment Recommendations  Rolling walker with 5" wheels    Recommendations for Other Services       Precautions / Restrictions Precautions Precautions: Fall Restrictions Weight Bearing Restrictions: No    Mobility  Bed Mobility Overal bed mobility: Needs Assistance Bed Mobility: Supine to Sit     Supine to sit: Max assist;+2 for physical assistance;HOB elevated     General bed mobility comments: Patient today requiring max assist to come to EOB (2 person). Difficulty moving LEs to EOB L>R  Transfers Overall transfer level: Needs assistance   Transfers: Lateral/Scoot Transfers Sit to Stand:  (attempted but could not perform at this time)         General transfer comment: Attempted, patient unable to perform, no control over  LLE. During transfer LLE remained outstretched when asking patient to reposition patient could not bring LE back underneath required physical assist. Patient required 2 person max to total assist using check pad to lateral slide patient into chair.  Ambulation/Gait             General Gait Details: unable to perform   Stairs            Wheelchair Mobility    Modified Rankin (Stroke Patients Only)       Balance Overall balance assessment: Needs assistance Sitting-balance support: No upper extremity supported Sitting balance-Leahy Scale: Poor Sitting balance - Comments: patient could not maintain midline today, right lateral lean noted throughout session. patient required multiple cues to direct attention to LUE for support. patient with complete fall back to the bed from sitting (right and posterior) wth inability to engage and correct. Min to moderate assist for balance control throughout EOB.   Standing balance support: Bilateral upper extremity supported Standing balance-Leahy Scale: Zero Standing balance comment: could not elevate to standing today                    Cognition Arousal/Alertness: Awake/alert Behavior During Therapy: WFL for tasks assessed/performed;Anxious Overall Cognitive Status: History of cognitive impairments - at baseline                      Exercises      General Comments        Pertinent Vitals/Pain Pain Assessment: Faces Faces Pain Scale: Hurts even more Pain Location: Rt knee Pain Descriptors / Indicators:  Aching;Sharp;Guarding;Grimacing Pain Intervention(s): Monitored during session    Home Living                      Prior Function            PT Goals (current goals can now be found in the care plan section) Acute Rehab PT Goals Patient Stated Goal: return home PT Goal Formulation: With patient/family Time For Goal Achievement: 03/06/16 Potential to Achieve Goals: Good Progress towards PT  goals: Not progressing toward goals - comment (continual decline in functional status, paged MD)    Frequency    Min 3X/week      PT Plan Discharge plan needs to be updated    Co-evaluation PT/OT/SLP Co-Evaluation/Treatment: Yes Reason for Co-Treatment: For patient/therapist safety;Complexity of the patient's impairments (multi-system involvement);Necessary to address cognition/behavior during functional activity PT goals addressed during session: Mobility/safety with mobility OT goals addressed during session: ADL's and self-care     End of Session Equipment Utilized During Treatment: Gait belt Activity Tolerance: Patient limited by pain Patient left: in chair;with call bell/phone within reach;with chair alarm set     Time: [phone] PT Time Calculation (min) (ACUTE ONLY): 36 min  Charges:  $Therapeutic Activity: 8-22 mins                    G Codes:      Fabio Asa 03-30-2016, 9:59 AM Charlotte Crumb, PT DPT  [phone]

## 2016-03-27 NOTE — Consult Note (Signed)
[facility]  Subjective  - confused right knee hurts   Objective (!) 99/59 (!) 102 98.5 F (36.9 C) (Rectal) 18 97%  Intake/Output Summary (Last 24 hours) at 02/25/2016 1145 Last data filed at 03/05/2016 0936  Gross per 24 hour  Intake              840 ml  Output                0 ml  Net              840 ml   Extremities: symmetrically cool feet with non palpable pedal pulses, right leg shorter than left, right knee tender to palpation, calf soft bilaterally no edema  Assessment/Planning: Persistant right leg pain does not seem to be vascular etiology with normal abi.   Will still arrange outpt follow up as some chronic PAD Knee xray head CT pending   Fabienne BrunsFields, Charles 03/12/2016 11:45 AM --  Laboratory Lab Results: No results for input(s): WBC, HGB, HCT, PLT in the last 72 hours. BMET  Recent Labs  03/18/2016 0231 03/13/2016 0334  NA 131* 133*  K 4.0 3.5  CL 100* 102  CO2 22 22  GLUCOSE 115* 86  BUN 15 24*  CREATININE 1.35* 1.87*  CALCIUM 7.1* 7.3*    COAG Lab Results  Component Value Date   INR 1.52 01-20-17   INR 1.09 04/30/2015   INR 1.07 12/16/2008   No results found for: PTT

## 2016-03-27 NOTE — Code Documentation (Signed)
CODE BLUE NOTE  Patient Name: Darin LimboJimmy A Meriweather   MRN: [id number]   Date of Birth/ Sex: 20-Feb-1941 , male      Admission Date: 03/17/2016  Attending Provider: Inez CatalinaEmily B Mullen, MD  Primary Diagnosis: <principal problem not specified>    Indication: Pt was in his usual state of health until this PM, when he was noted to be less responsive through the evening and was noted to have a faint pulse. Code blue was subsequently called. At the time of arrival on scene, ACLS protocol was underway.    Technical Description:  - CPR performance duration:  30 minutes  - Was defibrillation or cardioversion used? No   - Was external pacer placed? No  - Was patient intubated pre/post CPR? Yes    Medications Administered: Y = Yes; Blank = No Amiodarone    Atropine    Calcium    Epinephrine    Lidocaine    Magnesium    Norepinephrine    Phenylephrine    Sodium bicarbonate    Vasopressin      Post CPR evaluation:  - Final Status - Was patient successfully resuscitated ? Yes - What is current rhythm? A-fib with RVR - What is current hemodynamic status? Stable, BP 130s/60s   Miscellaneous Information:  - Labs sent, including: Troponin, mag, lactic acid, CBC, BMET, glucose, ABG, CK  - Primary team notified?  Yes  - Family Notified? Yes  - Additional notes/ transfer status: Transferred from 3E09 to 2M04        Wendee Beaversavid J McMullen, DO  03/16/2016, 8:41 PM

## 2016-03-27 NOTE — Procedures (Signed)
Arterial Catheter Insertion Procedure Note Darin Gonzalez [id number] [birth year]  Procedure: Insertion of Arterial Catheter  Indications: Blood pressure monitoring and Frequent blood sampling  Procedure Details Consent: Risks of procedure as well as the alternatives and risks of each were explained to the (patient/caregiver).  Consent for procedure obtained. Time Out: Verified patient identification, verified procedure, site/side was marked, verified correct patient position, special equipment/implants available, medications/allergies/relevent history reviewed, required imaging and test results available.  Performed  Maximum sterile technique was used including antiseptics, cap, gloves, gown, hand hygiene, mask and sheet. Skin prep: Chlorhexidine; local anesthetic administered 20 gauge catheter was inserted into right femoral artery using the Seldinger technique.  Evaluation Blood flow good; BP tracing good. Complications: No apparent complications.   Darin Gonzalez,Darin Gonzalez 03/24/2016

## 2016-03-27 NOTE — Progress Notes (Signed)
No documentation done for vent due to pt has expired.

## 2016-03-27 NOTE — Progress Notes (Signed)
Rt called by Rapid Response to obtain an ABG.  Rt entered to find very weak pulses and was unable to obtain a sample.  Wuithin several minutes pulses were weaker and more thready and chest compressions were started.  Minna Antis, RRT arrived and placed ETT (8.50m at 26 @ lips).  Pt was transported to 38M 04 by RT and RR.  I met them after speaking with family and pt had lost pulses again and chest compressions were resumed during transport.  Arriving on unit, CPR continued until ROSC.  Unit RT given report and left with patient.

## 2016-03-27 NOTE — Progress Notes (Signed)
Approached by pt's son Merideth AbbeyJ. Kritikos Jr. After 1930 while change of shift,   Pointing his finger at me.  Stating "I told you for three days, now you made my father sick."  Kept walking behind me and his sister came and held him back, who  stated "Leave her alone".  I walked away from pt's son as I feel threatened that he was going to hurt me. Asked nurses in hallway to ask the secretary  to call security.  Walk off floor the hallway and  waited in one the floor rooms  for security to come up.  At end of code was asked by the charge nurse on day  that pt's son wanted to apologize to me.  Came out and spoke with pt's son who apologized.  Kaisa Wofford,RN

## 2016-03-27 NOTE — Progress Notes (Signed)
Pt with on and off confusion, uncooperative. Screams at times  Patient called 911 few times.. notified IMTS for increasing confusion and pain. Observed pt carefully.

## 2016-03-27 NOTE — Progress Notes (Signed)
RT called to assist Charge RT with rapid response.  Upon arrival to room rapid response nurse, charge RT, and 3E nursing staff at bedside.  Patient noted to be unresponsive with RR 8-10 per minute.  Unable to locate pulses via palpation or doppler.  Compressions started and code blue procedures initiated.  BVM ventilations assumed by this RT.  After 2 minutes of CPR and 1mg  of epi 1:10,000, patient regained pulses, but remained unresponsive.  Patient noted to react to visual threat by blinking, but was not protecting airway.  Patient intubated by this RT (see note).  No gag or purposeful movement noted with blade or ETT insertion.  BBS present with positive colormetric capnography.  ETT secured at 26 at the lip.  RT maintained airway throughout transport to 2M04.  Pulses lost in the elevator with compressions restarted.  Airway turned over to MICU RT.

## 2016-03-27 NOTE — Progress Notes (Signed)
   Responded to code.  Met w/ & escorted family from [place] to 61M (family currently @ 2S waiting room).  Prayed w/ family.   Will follow, as needed.  - Rev. Hanson MDiv ThM

## 2016-03-27 NOTE — Procedures (Signed)
Intubation Procedure Note Darin Gonzalez [id number] [birth year]  Procedure: Intubation Indications: Airway protection and maintenance  Procedure Details Consent: Unable to obtain consent because of emergent medical necessity. Time Out: Verified patient identification, verified procedure, site/side was marked, verified correct patient position, special equipment/implants available, medications/allergies/relevent history reviewed, required imaging and test results available.  Performed  Maximum sterile technique was used including gloves and hand hygiene.  MAC and 4    Evaluation Hemodynamic Status: Cardiac Arrest; O2 sats: No available; Cardiac Arrest  Patient's Current Condition: unstable Complications: No apparent complications Patient did tolerate procedure well. Chest X-ray ordered to verify placement.  CXR: pending.   Darin Gonzalez [facility] 03-13-16

## 2016-03-27 NOTE — Procedures (Signed)
Central Venous Catheter Insertion Procedure Note Darin Gonzalez [id number] [birth year]  Procedure: Insertion of Central Venous Catheter Indications: Assessment of intravascular volume, Drug and/or fluid administration and Frequent blood sampling  Procedure Details Consent: Risks of procedure as well as the alternatives and risks of each were explained to the (patient/caregiver).  Consent for procedure obtained. Time Out: Verified patient identification, verified procedure, site/side was marked, verified correct patient position, special equipment/implants available, medications/allergies/relevent history reviewed, required imaging and test results available.  Performed  Maximum sterile technique was used including antiseptics, cap, gloves, gown, hand hygiene, mask and sheet. Skin prep: Chlorhexidine; local anesthetic administered A antimicrobial bonded/coated triple lumen catheter was placed in the right femoral vein due to emergent situation using the Seldinger technique.  Evaluation Blood flow good Complications: No apparent complications Patient did tolerate procedure well. Chest X-ray ordered to verify placement.  CXR: pending.  Darin Gonzalez 03/07/2016, 10:42 PM

## 2016-03-27 NOTE — Progress Notes (Signed)
Blood sugar=67. 40z orange juice given. Endorsed to incoming RN to recheck blood sugar by finger stick at 0715

## 2016-03-27 NOTE — Progress Notes (Signed)
Pharmacy Antibiotic Note  Darin Gonzalez is a 76 y.o. male admitted on 03/26/2016 with possible aspiration pneumonia.  Pharmacy has been consulted for Ampicillin-Sulbactam dosing. SCr has been increasing. Unsure if UOP is accurate.   Plan: Ampicillin-sulbactam 3g IV every 8 hours.  Monitor renal function, clinical status, and culture results.   Height: 5\' 8"  (172.7 cm) Weight: 226 lb 14.4 oz (102.9 kg) IBW/kg (Calculated) : 68.4  Temp (24hrs), Avg:98.3 F (36.8 C), Min:98 F (36.7 C), Max:98.5 F (36.9 C)   Recent Labs Lab 2016/10/11 1022  2016/10/11 1114 2016/10/11 1352 02/28/16 0428  03/01/16 0543 03/02/16 0233 03/02/16 1531 03/02/2016 0231 03/09/2016 0334  WBC 11.4*  --   --   --  8.8  --   --   --   --   --   --   CREATININE 1.66*  < >  --   --  1.32*  < > 1.05 1.27* 1.31* 1.35* 1.87*  LATICACIDVEN  --   --  2.21* 1.36  --   --   --   --   --   --   --   < > = values in this interval not displayed.  Estimated Creatinine Clearance: 39.7 mL/min (by C-G formula based on SCr of 1.87 mg/dL (H)).    Allergies  Allergen Reactions  . Sulfonamide Derivatives Shortness Of Breath  . Codeine     "crazy"    Antimicrobials this admission: Unasyn 1/9 >>  Dose adjustments this admission:  Microbiology results: 1/3 UCx: negative  Thank you for allowing pharmacy to be a part of this patient's care.  Link SnufferJessica Emmaly Leech, PharmD, BCPS Clinical Pharmacist Clinical Phone 03/11/2016 until 11PM [phone]- #[phone] After hours, please call #[phone] 03/11/2016 9:06 PM

## 2016-03-27 NOTE — Progress Notes (Signed)
Pt with poor sitting tolerance, assisted back to bed.   2017/02/16 1100  OT Visit Information  Last OT Received On 2017/02/16  Assistance Needed +2  PT/OT/SLP Co-Evaluation/Treatment Yes  Reason for Co-Treatment For patient/therapist safety  OT goals addressed during session Other (comment) (transfer)  History of Present Illness Pt adm after fall at home. Pt found to be hypotensive and dehydrated. PMH - DM, HTN, afib, pacer  Precautions  Precautions Fall  Pain Assessment  Pain Assessment Faces  Faces Pain Scale 6  Pain Location B LEs, buttocks  Pain Descriptors / Indicators Aching;Sharp;Guarding;Grimacing  Pain Intervention(s) Monitored during session;Repositioned  Cognition  Arousal/Alertness Awake/alert  Behavior During Therapy WFL for tasks assessed/performed;Anxious  Overall Cognitive Status Impaired/Different from baseline  Area of Impairment Memory;Problem solving  Memory Decreased short-term memory  Problem Solving Slow processing;Decreased initiation;Difficulty sequencing;Requires verbal cues;Requires tactile cues  General Comments pt with poor immediate memory, repeated calling for help without using call button  Bed Mobility  Overal bed mobility Needs Assistance  Bed Mobility Rolling  Rolling Mod assist  General bed mobility comments positioned pt on L side in bed with pillow behind back and between legs  Transfers  Overall transfer level Needs assistance  General transfer comment assisted pt back to bed due to inability to tolerate chair, used draw sheet to assist pt from reclined chair to bed  OT - End of Session  Equipment Utilized During Treatment Oxygen  Activity Tolerance Patient limited by pain  Patient left in bed;with call bell/phone within reach;with bed alarm set  OT Assessment/Plan  OT Plan Discharge plan remains appropriate  OT Frequency (ACUTE ONLY) Min 2X/week  Follow Up Recommendations SNF;Supervision/Assistance - 24 hour  OT Goal Progression   Progress towards OT goals Not progressing toward goals - comment (weakness intolerance of activity)  Acute Rehab OT Goals  Patient Stated Goal agrees he need rehab  Time For Goal Achievement 03/13/16  Potential to Achieve Goals Good  OT Time Calculation  OT Start Time (ACUTE ONLY) 0941  OT Stop Time (ACUTE ONLY) 0955  OT Time Calculation (min) 14 min  OT General Charges  $OT Visit 1 Procedure  OT Treatments  $Therapeutic Activity 8-22 mins  02/25/2016  03/10/2016 Martie RoundJulie Varnika Butz, OTR/L Pager: [phone]

## 2016-03-27 NOTE — Clinical Social Work Note (Signed)
Clinical Social Work Assessment  Patient Details  Name: Darin Gonzalez MRN: [id number] Date of Birth: 1940/12/13  Date of referral:  03-05-2016               Reason for consult:  Facility Placement, Discharge Planning                Permission sought to share information with:  Facility Sport and exercise psychologist, Family Supports Permission granted to share information::  Yes, Verbal Permission Granted  Name::     Everest, Brod.  Agency::  SNF's  Relationship::  Son  Contact Information:  [phone]  Housing/Transportation Living arrangements for the past 2 months:  [place] of Information:  Patient, Medical Team Patient Interpreter Needed:  None Criminal Activity/Legal Involvement Pertinent to Current Situation/Hospitalization:  No - Comment as needed Significant Relationships:  Adult Children Lives with:  Self Do you feel safe going back to the place where you live?  No Need for family participation in patient care:  Yes (Comment)  Care giving concerns:  PT has changed their recommendation from HHPT to SNF.   Social Worker assessment / plan:  CSW met with patient. No supports at bedside. CSW introduced role and explained that PT recommendations would be discussed. Patient agreeable to SNF placement. No preference on facility. CSW left SNF list for patient to review with his children. No further concerns. CSW encouraged patient to contact CSW as needed. CSW will continue to follow patient for support and facilitate discharge to SNF once medically stable.  Employment status:  Retired Nurse, adult PT Recommendations:  [place] / Referral to community resources:  [place]  Patient/Family's Response to care:  Patient agreeable to SNF placement. Patient's children supportive and involved in patient's care. Patient appreciated social work intervention.  Patient/Family's Understanding of and  Emotional Response to Diagnosis, Current Treatment, and Prognosis:  Patient understands and is agreeable to discharge plan. Patient appears happy with hospital care.  Emotional Assessment Appearance:  Appears stated age Attitude/Demeanor/Rapport:  Other (Pleasant) Affect (typically observed):  Accepting, Appropriate, Calm, Pleasant Orientation:  Oriented to Self, Oriented to Place, Oriented to  Time, Oriented to Situation Alcohol / Substance use:  Never Used Psych involvement (Current and /or in the community):  No (Comment)  Discharge Needs  Concerns to be addressed:  Care Coordination Readmission within the last 30 days:  No Current discharge risk:  Dependent with Mobility, Lives alone Barriers to Discharge:  Continued Medical Work up, [place], LCSW March 05, 2016, 3:53 PM

## 2016-03-27 NOTE — Consult Note (Signed)
PULMONARY / CRITICAL CARE MEDICINE   Name: Darin Gonzalez MRN: [id number] DOB: 1940/05/12    ADMISSION DATE:  03/12/2016 CONSULTATION DATE:  2016/03/21  REFERRING MD:  Thayer Headings  CHIEF COMPLAINT:  Cardiac arrest, respiratory failure and cardiogenic shock  HISTORY OF PRESENT ILLNESS:   [name] is a 76 year old with history of paroxysmal Afib non compliant on Xarelto, hx of complete heart block s/p pacemaker by Dr. Lovena Le on April 30, 2015, Diabetes mellitus type II, HTN who was admitted on 02/27/15 for hypotension with lactic acidosis s/p fall, syncope?Marland Kitchen  His lactic acidosis resolved with fluids.  He was also found to be in Afib with RVR and started on metoprolol, later on admission amiodarone was added and metoprolol was dc'd.  His pacemaker was interrogated during admission and showed he has been in AF since June. During admission he had a TTE that showed HFrEF LV EF 45-50%.  He had elevated troponin Peaked at 0.31>>0.19, thought to be due to demand ischemia.  EKG did have new TWI noted this admission.  No ischemia on Lexiscan Myoviewthis admission. Cardioverted on 1/8 with conversion to Normal sinus rhythm that reverted back to Afib.  On 03-21-2022 patient coded and CPR was performed for 30 minutes, patient was successfully resuscitated and transferred to the ICU.    PAST MEDICAL HISTORY :  He  has a past medical history of Arthritis; Fall at home (02/26/2016); GERD (gastroesophageal reflux disease); Hypercholesteremia; Hypertension; Paroxysmal atrial fibrillation with rapid ventricular response ([place]) (2010); Presence of permanent cardiac pacemaker; and Type II diabetes mellitus ([place]).  PAST SURGICAL HISTORY: He  has a past surgical history that includes Finger amputation; Abdominal percutaneous abscess drain (2010); Cardiac catheterization (N/A, 04/30/2015); Tonsillectomy (1947); Appendectomy (2010); Hemorrhoid banding; TEE without cardioversion (N/A, 03/18/2016); and Cardioversion (N/A,  03/13/2016).  Allergies  Allergen Reactions  . Sulfonamide Derivatives Shortness Of Breath  . Codeine     "crazy"    No current facility-administered medications on file prior to encounter.    Current Outpatient Prescriptions on File Prior to Encounter  Medication Sig  . ACCU-CHEK SOFTCLIX LANCETS lancets TEST BLOOD SUGAR 3 TIMES DAILY DUE TO FLUCTUATING BLOOD SUGARS.  Marland Kitchen Alcohol Swabs (B-D SINGLE USE SWABS REGULAR) PADS Test blood sugar 3 times daily due to fluctuating blood sugar. Dx code: E11.9  . Blood Glucose Calibration (ACCU-CHEK AVIVA) SOLN TEST BLOOD SUGAR 3 TIMES DAILY DUE TO FLUCTUATING BLOOD SUGARS.   Marland Kitchen blood glucose meter kit and supplies KIT Test blood sugar 3 times daily due to fluctuating blood sugar. Dx code: E11.9  . Blood Glucose Monitoring Suppl (ACCU-CHEK AVIVA PLUS) w/Device KIT TEST BLOOD SUGAR 3 TIMES DAILY DUE TO FLUCTUATING BLOOD SUGARS  . clonazePAM (KLONOPIN) 1 MG tablet TAKE 1 TABLET TWICE DAILY AS NEEDED FOR ANXIETY  . esomeprazole (NEXIUM) 40 MG capsule TAKE 1 CAPSULE EVERY DAY BEFORE BREAKFAST  . fluticasone (FLONASE) 50 MCG/ACT nasal spray USE 1 SPRAY IN EACH NOSTRIL EVERY DAY  . glucose blood (ACCU-CHEK AVIVA PLUS) test strip TEST BLOOD SUGAR 3 TIMES DAILY DUE TO FLUCTUATING BLOOD SUGARS.  Marland Kitchen HYDROcodone-acetaminophen (NORCO) 7.5-325 MG tablet Take 1 tablet by mouth 3 (three) times daily as needed for moderate pain.  Marland Kitchen ibuprofen (ADVIL,MOTRIN) 600 MG tablet TAKE 1 TABLET BY MOUTH  EVERY 8 HOURS AS NEEDED FOR PAIN.  Marland Kitchen LOTREL 5-10 MG capsule TAKE 1 CAPSULE EVERY DAY  . metFORMIN (GLUCOPHAGE) 1000 MG tablet TAKE 1/2 TABLET TWICE DAILY WITH MEALS (Patient taking differently:  TAKE 1/2 TABLE=500MG BY MOUTH TWICE DAILY WITH MEALS)  . pravastatin (PRAVACHOL) 40 MG tablet TAKE 1 TABLET EVERY DAY  . rivaroxaban (XARELTO) 20 MG TABS tablet Take 1 tablet (20 mg total) by mouth daily with supper.  Marland Kitchen UNABLE TO FIND NEBULIZER MACHINE. DX CODE: 491.0  . UNABLE TO FIND  OXYGEN CONCENTRATOR AND BACK UP O2 TANK. NOCTURNAL USE, 2L. DX CODE: 491.0  . UNABLE TO FIND Wrist blood pressure monitor. Dx code: I56. Use to check blood pressure as directed.  Marland Kitchen UNABLE TO FIND Pulse oximeter (finger). Dx code: G95.621. Use to monitor O2 sat as directed.  . VENTOLIN HFA 108 (90 Base) MCG/ACT inhaler INHALE 2 PUFFS  EVERY 6 HOURS AS NEEDED FOR COUGH, SHORTNESS OF BREATH OR WHEEZING (NEED OFFICE VISIT)  . HYDROcodone-acetaminophen (NORCO/VICODIN) 5-325 MG tablet Take 1-2 tablets by mouth every 6 (six) hours as needed for moderate pain. (Patient not taking: Reported on 02/26/2016)  . terbinafine (LAMISIL) 250 MG tablet Take 1 tablet (250 mg total) by mouth daily. (Patient not taking: Reported on 03/19/2016)  . zoster vaccine live, PF, (ZOSTAVAX) 30865 UNT/0.65ML injection Inject 19,400 Units into the skin once. (Patient not taking: Reported on 03/24/2016)    FAMILY HISTORY:  His indicated that his mother is deceased. He indicated that his father is deceased. He indicated that the status of his brother is unknown.    SOCIAL HISTORY: He  reports that he quit smoking about 36 years ago. His smoking use included Cigarettes. He has a 40.00 pack-year smoking history. His smokeless tobacco use includes Chew. He reports that he does not drink alcohol or use drugs.  REVIEW OF SYSTEMS:   Unattainable, patient is encephalopathic post arrest  SUBJECTIVE:  Unresponsive  VITAL SIGNS: BP (!) 75/52   Pulse 100   Temp 98.5 F (36.9 C) (Rectal)   Resp (!) 22   Ht '5\' 8"'  (1.727 m)   Wt 102.9 kg (226 lb 14.4 oz)   SpO2 (!) 80%   BMI 34.50 kg/m   HEMODYNAMICS:    VENTILATOR SETTINGS: Vent Mode: PRVC FiO2 (%):  [100 %] 100 % Set Rate:  [20 bmp] 20 bmp Vt Set:  [500 mL] 500 mL PEEP:  [5 cmH20] 5 cmH20 Plateau Pressure:  [20 cmH20] 20 cmH20  INTAKE / OUTPUT: I/O last 3 completed shifts: In: 840 [P.O.:840] Out: 200 [Urine:200]  PHYSICAL EXAMINATION: General: Chronically ill  appearing male, unresponsive post code. Neuro:  Unresponsive post code, pupils are fixed and dilated, has a respiratory drive. HEENT:  [place]/AT, pupils fixed and dilated, does not withdraw to pain Cardiovascular:  IRIR, Nl S1/S2, -M/R/G. Lungs:  Coarse BS diffusely. Abdomen:  Soft, NT, ND and +BS Musculoskeletal:  -edema and -tenderness Skin:  Intact, mottled  LABS:  BMET  Recent Labs Lab 03/02/16 1531 03/19/2016 0231 2016-03-14 0334  NA 132* 131* 133*  K 3.9 4.0 3.5  CL 104 100* 102  CO2 20* 22 22  BUN 12 15 24*  CREATININE 1.31* 1.35* 1.87*  GLUCOSE 127* 115* 86   Electrolytes  Recent Labs Lab 02/29/16 0345  03/01/16 0543 03/02/16 0233 03/02/16 1531 03/08/2016 0231 2016/03/14 0334  CALCIUM 7.2*  --  7.6* 7.2* 7.3* 7.1* 7.3*  MG  --   < > 1.3* 1.2*  --  1.7 1.6*  PHOS 1.8*  --  2.3* 2.3*  --   --   --   < > = values in this interval not displayed.  CBC  Recent Labs Lab 02/26/2016  1022 03/14/2016 1113 02/28/16 0428  WBC 11.4*  --  8.8  HGB 13.9 13.6 12.8*  HCT 41.2 40.0 38.3*  PLT 200  --  177   Coag's  Recent Labs Lab 03/02/2016 1022  INR 1.52   Sepsis Markers  Recent Labs Lab 03/10/2016 1114 03/10/2016 1352  LATICACIDVEN 2.21* 1.36   ABG No results for input(s): PHART, PCO2ART, PO2ART in the last 168 hours.  Liver Enzymes  Recent Labs Lab 03/22/2016 1022 02/28/16 0428  AST 44* 46*  ALT 16* 17  ALKPHOS 51 44  BILITOT 2.1* 1.3*  ALBUMIN 3.2* 2.8*   Cardiac Enzymes  Recent Labs Lab 02/25/2016 1022 03/16/2016 1705 03/11/2016 2213  TROPONINI 0.31* 0.23* 0.19*   Glucose  Recent Labs Lab 03/14/2016 0631 03/17/16 0647 03-17-16 1108 03/17/16 1855 2016/03/17 1922 03-17-16 2016  GLUCAP 116* 67 94 64* 78 73   Imaging Ct Head Wo Contrast  Result Date: Mar 17, 2016 CLINICAL DATA:  Left arm weakness. EXAM: CT HEAD WITHOUT CONTRAST TECHNIQUE: Contiguous axial images were obtained from the base of the skull through the vertex without intravenous contrast.  COMPARISON:  CT scan of February 26, 2016. FINDINGS: Brain: No evidence of acute infarction, hemorrhage, hydrocephalus, extra-axial collection or mass lesion/mass effect. Vascular: No hyperdense vessel or unexpected calcification. Skull: Normal. Negative for fracture or focal lesion. Sinuses/Orbits: No acute finding. Other: None. IMPRESSION: Normal head CT. Electronically Signed   By: Marijo Conception, M.D.   On: 03-17-16 12:58   Dg Chest Port 1 View  Result Date: 2016-03-17 CLINICAL DATA:  Respiratory failure.  Coded.  Post CPR. EXAM: PORTABLE CHEST 1 VIEW COMPARISON:  Chest radiograph February 27, 2016 FINDINGS: Cardiac silhouette is upper limits of normal in size. Mediastinal silhouette is nonsuspicious. Pulmonary vascular congestion. Diffuse interstitial prominence with patchy predominately RIGHT alveolar airspace opacities. No pleural effusion. No pneumothorax. Endotracheal tube tip projects 4.6 cm above the carina. Dual lead LEFT cardiac pacemaker in situ. Multiple EKG lines overlie the patient and may obscure subtle underlying pathology. Soft tissue planes and included osseous structures are nonsuspicious. IMPRESSION: Borderline cardiomegaly. Interstitial and alveolar airspace opacities suggest pulmonary edema, less likely pneumonia. Endotracheal tube tip projects 4.6 cm above the carina. Electronically Signed   By: Elon Alas M.D.   On: 17-Mar-2016 20:47   Dg Knee Complete 4 Views Right  Result Date: 2016/03/17 CLINICAL DATA:  Fall. EXAM: RIGHT KNEE - COMPLETE 4+ VIEW COMPARISON:  No recent prior . FINDINGS: Small knee joint effusion. Severe tricompartment degenerative change. No evidence of fracture or dislocation. Peripheral vascular calcification. IMPRESSION: 1. Severe tricompartment degenerative change. No acute bony abnormality. Small knee joint effusion. 2. Peripheral vascular disease . Electronically Signed   By: Marcello Moores  Register   On: 2016/03/17 12:40   STUDIES:  Head CT 1/9>>> (will be  performed when hemodynamically stable)  CULTURES: Urine 1/3>>>NTD  ANTIBIOTICS: Unasyn 1/6>>>  SIGNIFICANT EVENTS: 1/9 Cardiac arrest, unknown cause now in refractory shock  LINES/TUBES: ETT 1/9>>> R fem TLC 1/9>>> R fem a-line 1/9>>>  DISCUSSION: 76 year old male with extensive cardiac history who suffered a 45 minute in hospital cardiac arrest and now in severe cardiogenic shock.  Spoke with family, after discussion, they agreed to DNR but full medical care.  ASSESSMENT / PLAN:  PULMONARY A: VDRF post arrest P:   - Full vent support - ABG - Adjust vent for ABG - PEEP 10 for hypoxemia (patient is mottled though, would like to see an actual ABG).  CARDIOVASCULAR A:  Cardiac arrest Cardiogenic shock P:  - Cards informed - Epi drip - Norepi drip - Will need to follow CVPs for IVF resuscitation when more stable. - Would like to see a head CT pending hemodynamics - No cooling given refractory shock - Incase of PE, heparin drip for a-fib but now on xarelto, awaiting INR and pharmacy will address  RENAL A:   Worsening renal failure P:   - BMET in AM - IVF resuscitation - Replace electrolytes as indicated  GASTROINTESTINAL A:   No active issues P:   - Monitor - Tele  HEMATOLOGIC A:   No active issues P:  - CBC in AM - Replace electrolytes as indicated  INFECTIOUS A:   ?aspiration PNA P:   - Unasyn - Blood cultures x2  ENDOCRINE A:   No active issues   P:   - Monitor CBGs  NEUROLOGIC A:   Acute anoxic injury P:   RASS goal: 0 - Hold sedation - Head CT if hemodynamically more stable. - May need EEG  FAMILY  - Updates: Spoke with family at length, they understand how severely ill patient is, ok with DNR for now but full medical care.  - Inter-disciplinary family meet or Palliative Care meeting due by:  day 7  The patient is critically ill with multiple organ systems failure and requires high complexity decision making for assessment  and support, frequent evaluation and titration of therapies, application of advanced monitoring technologies and extensive interpretation of multiple databases.   Critical Care Time devoted to patient care services described in this note is  90  Minutes. This time reflects time of care of this signee Dr Jennet Maduro. This critical care time does not reflect procedure time, or teaching time or supervisory time of PA/NP/Med student/Med Resident etc but could involve care discussion time.  Rush Farmer, M.D. [facility] Pulmonary/Critical Care Medicine. Pager: [phone]. After hours pager: [phone].  2016-03-05, 10:24 PM

## 2016-03-27 NOTE — Progress Notes (Signed)
Patient Name: Darin Gonzalez Date of Encounter: 03/25/2016  Primary Cardiologist: Dr. Renee Rivalaylor  Hospital Problem List     Active Problems:   Diabetes mellitus type 2, noninsulin dependent (HCC)   Essential hypertension   PAF (paroxysmal atrial fibrillation) (HCC)   Dehydration   Elevated CK   Elevated serum creatinine   Elevated troponin   Neck mass   Other fatigue   Syncope   Pacemaker   Arterial hypotension   Longstanding persistent atrial fibrillation (HCC)    Subjective   Confused.  Reports diffuse pain.  Denies palpitations, chest pain or shortness of breath.   Inpatient Medications    Scheduled Meds: . amiodarone  400 mg Oral BID  . diclofenac sodium  2 g Topical QID  . feeding supplement (ENSURE ENLIVE)  237 mL Oral BID BM  . fluticasone  2 spray Each Nare Daily  . magnesium oxide  400 mg Oral Once  . pantoprazole  40 mg Oral Daily  . pravastatin  40 mg Oral Daily  . rivaroxaban  20 mg Oral Q supper  . senna  1 tablet Oral BID  . sodium chloride flush  3 mL Intravenous Q12H   Continuous Infusions:  PRN Meds: acetaminophen **OR** acetaminophen, ibuprofen, Melatonin, promethazine   Vital Signs    Vitals:   03/01/2016 1905 02/29/2016 2353 February 26, 2016 0140 February 26, 2016 0437  BP: 106/74 118/60 (!) 98/56 102/66  Pulse:  95 89 (!) 108  Resp:  20  18  Temp:  98 F (36.7 C)  98.5 F (36.9 C)  TempSrc:  Oral  Rectal  SpO2:  98%  100%  Weight:      Height:        Intake/Output Summary (Last 24 hours) at February 26, 2016 1114 Last data filed at February 26, 2016 0936  Gross per 24 hour  Intake              840 ml  Output                0 ml  Net              840 ml   Filed Weights   03/01/16 0423 03/02/16 0639 03/10/2016 0429  Weight: 100.9 kg (222 lb 6.4 oz) 101.8 kg (224 lb 8 oz) 102.9 kg (226 lb 14.4 oz)    Physical Exam    GEN: Well nourished, well developed, in no acute distress.  HEENT: Grossly normal.  Neck: Supple, no JVD, carotid bruits, or masses. Cardiac:  Irregularly irregular.  Tachycardic.  No murmurs, rubs, or gallops.  Respiratory:  Respirations regular and unlabored, clear to auscultation bilaterally. GI: Soft, nontender, nondistended, BS + x 4. MS: no deformity or atrophy. Skin: warm and dry, no rash. Neuro:  Strength and sensation are intact. Psych: AAOx3.  Normal affect.  However, he thinks that he has chewing tobacco in his mouth (he does not), pulled his IV out and called 911 overnight.  Ext: Warm.  1+ DP/PT bilaterally.  No edema.   Labs    CBC No results for input(s): WBC, NEUTROABS, HGB, HCT, MCV, PLT in the last 72 hours. Basic Metabolic Panel  Recent Labs  03/02/16 0233  02/29/2016 0231 February 26, 2016 0334  NA 135  < > 131* 133*  K 3.8  < > 4.0 3.5  CL 105  < > 100* 102  CO2 22  < > 22 22  GLUCOSE 95  < > 115* 86  BUN 10  < > 15 24*  CREATININE 1.27*  < > 1.35* 1.87*  CALCIUM 7.2*  < > 7.1* 7.3*  MG 1.2*  --  1.7 1.6*  PHOS 2.3*  --   --   --   < > = values in this interval not displayed. Liver Function Tests No results for input(s): AST, ALT, ALKPHOS, BILITOT, PROT, ALBUMIN in the last 72 hours. No results for input(s): LIPASE, AMYLASE in the last 72 hours. Cardiac Enzymes No results for input(s): CKTOTAL, CKMB, CKMBINDEX, TROPONINI in the last 72 hours. BNP Invalid input(s): POCBNP D-Dimer No results for input(s): DDIMER in the last 72 hours. Hemoglobin A1C No results for input(s): HGBA1C in the last 72 hours. Fasting Lipid Panel No results for input(s): CHOL, HDL, LDLCALC, TRIG, CHOLHDL, LDLDIRECT in the last 72 hours. Thyroid Function Tests No results for input(s): TSH, T4TOTAL, T3FREE, THYROIDAB in the last 72 hours.  Invalid input(s): FREET3  Telemetry    Atrial fibrillation. PACs, PVCs.  Occasional VP.  Rates 90s-150s Personally Reviewed  ECG    02/28/16: Atrial fibrillation.  Rate 93 bpm. PVCs. Low voltage.  Anterolateral T wave inversion -  - Personally Reviewed  Radiology    No results  found.  Cardiac Studies   [id number] 02/29/16: Study Conclusions  - Left ventricle: ABnormal septal motion inferobasal hypokinesis The cavity size was normal. Wall thickness was normal. Systolic function was mildly reduced. The estimated ejection fraction was in the range of 45% to 50%. - Left atrium: The atrium was mildly dilated. - Atrial septum: No defect or patent foramen ovale was identified.  Lexiscan Myoview 03/01/16: IMPRESSION: 1. Moderately large fixed apical septal defect compatible with scar versus remote infarct. No definite inducible ischemia with pharmacologic stress.  2. Septal dyskinesis.  3. Left ventricular ejection fraction 37%  4. Non invasive risk stratification*: Intermediate  Patient Profile     Darin Gonzalez is a 82M with persistent atrial fibrillation, CHB s/p St. Jude dual chamber PM,  hypertension, hyperlipidemia, and diabetes here with hypotension, fall, weakness and intravascular volume depletion.   Assessment & Plan    # Longstanding persistent atrial fibrillation:  # Fall/syncope: Device interrogation showed that he has been in atrial fibrillation with RVR for months.  He underwent TEE/DCCV yesterday but is back in atrial fibrillation.  Continue Xarelto.  Amiodarone started this admission.  Plan for 5g load then 200mg  daily.  Can repeat DCCV as an outpatient once amiodarone is loaded.   BP remains too low for nodal agents and renal function prohibits the use of digoxin.  Maintain K>4, Mg>2.  # Chronic systolic and diastolic heart failure: He currently has intravascular volume depletion.  Will give 500 mL NS bolus as he has AKI, BP is low, and he looks dry.  # Demand ischemia:  Troponin mildly elevated in a pattern consistent with demand ischemia from atrial fibrillation with RVR and volume depletion.  No ischemia on Lexiscan Myoview this admission.   # R LE pain: Darin Gonzalez reports pain in R leg.  He reports that this is chronic.  ABIs  were negative, though he did have heavily calcified vessels which can cause ABI's to have false negatives.  Consider CLI as an outpatient.  He isn't on an aspirin given that he is on Xarelto.  Continue pravastatin.   # CHB s/p PPM: Not currently in CHB as he is not pacing.  Device functioning well on interrogation.   Signed, Chilton Si, MD  March 26, 2016, 11:14 AM

## 2016-03-27 NOTE — Progress Notes (Signed)
Subjective: Yesterday, Darin Gonzalez went for his TEE/cardioversion. He got tramadol at 2 AM yesterday for pain.  A few hours afterwards he had increased confusion and was attempting to call 911 multiple times.  Blood sugar overnight was also noted to be 67 and he was given OJ. He still is talking about his "all over" right leg pain and states that the voltaren gel is not helping.  He also has new back pain from a stage 1 decubitis pressure ulcer.    On rounds, he did seem somewhat confused and repeatedly asked if he could get back in bed when he was already lying down in his hospital bed.    PT noted that he needs increased care with 24 hr assistance (SNF).   Objective: Vital signs in last 24 hours: Vitals:   03/22/2016 1905 03/24/2016 2353 30-Mar-2016 0140 2016-03-30 0437  BP: 106/74 118/60 (!) 98/56 102/66  Pulse:  95 89 (!) 108  Resp:  20  18  Temp:  98 F (36.7 C)  98.5 F (36.9 C)  TempSrc:  Oral  Rectal  SpO2:  98%  100%  Weight:      Height:       Weight change:   Intake/Output Summary (Last 24 hours) at 30-Mar-2016 0855 Last data filed at 2016/03/30 0834  Gross per 24 hour  Intake              600 ml  Output                0 ml  Net              600 ml   Physical Exam Gen: Well-appearing in NAD Neuro: Strength 5/5 in LUE and 4/5 in RUE.  Weakness at 4/5 in LE.   HEENT: NCAT. PEERLA. EOMI.  Visual fields by confrontation within normal limits. Moist mucous membranes.  CV: RRR with no murmurs, rubs, or gallops. Pulm:  CTAB, no wheezes or rhonchi. Abdomen: Soft, non-tender to palpation in four quadrants.  Normal BS appreciated.  Extremities: Hyperpigmentation of LE noted and unchanged.  Right knee effusion noted today. No pain or warmth at joint space.   Lab Results: BMP Latest Ref Rng & Units 2016-03-30 03/16/2016 03/02/2016  Glucose 65 - 99 mg/dL 86 115(H) 127(H)  BUN 6 - 20 mg/dL 24(H) 15 12  Creatinine 0.61 - 1.24 mg/dL 1.87(H) 1.35(H) 1.31(H)  Sodium 135 - 145 mmol/L 133(L) 131(L)  132(L)  Potassium 3.5 - 5.1 mmol/L 3.5 4.0 3.9  Chloride 101 - 111 mmol/L 102 100(L) 104  CO2 22 - 32 mmol/L 22 22 20(L)  Calcium 8.9 - 10.3 mg/dL 7.3(L) 7.1(L) 7.3(L)   Mg -- 1.3 > 1.7 > 1.6 Phos -- 2.3  1/6 ESR - 26 CRP - 3.8  CK -- 996 (1/4) >> 482 (1/6)   Micro Results: Recent Results (from the past 240 hour(s))  Urine culture     Status: None   Collection Time: 03/06/2016 12:30 PM  Result Value Ref Range Status   Specimen Description URINE, RANDOM  Final   Special Requests NONE  Final   Culture NO GROWTH  Final   Report Status 02/28/2016 FINAL  Final   Studies/Results: No results found. Medications: I have reviewed the patient's current medications. Scheduled Meds: . amiodarone  400 mg Oral BID  . diclofenac sodium  2 g Topical QID  . feeding supplement (ENSURE ENLIVE)  237 mL Oral BID BM  . fluticasone  2 spray Each Nare  Daily  . magnesium oxide  400 mg Oral Once  . pantoprazole  40 mg Oral Daily  . pravastatin  40 mg Oral Daily  . rivaroxaban  20 mg Oral Q supper  . senna  1 tablet Oral BID  . sodium chloride flush  3 mL Intravenous Q12H   Continuous Infusions: PRN Meds:.acetaminophen **OR** acetaminophen, Melatonin, promethazine Assessment/Plan:  Darin Gonzalez is a 76 yo man with h/oPAF, CHB s/p pacemaker 04/2015, DM, HTN, and HLD who presented on 1/3 with hypotension, generalized weakness, and recent fall at home and was admitted for dehydration and cardiac ischemia.  Now he is complaining of new right leg pain with concern for new delirium.   Active Problems:   Diabetes mellitus type 2, noninsulin dependent (HCC)   Essential hypertension   PAF (paroxysmal atrial fibrillation) (HCC)   Dehydration   Elevated CK   Elevated serum creatinine   Elevated troponin   Neck mass   Other fatigue   Syncope   Pacemaker   Arterial hypotension   Longstanding persistent atrial fibrillation ([place])  Confusion/ Delirium: Increased confusion overnight and somewhat  evident during the day.  Anesthesia and tramadol may have contributed yesterday.  - CT head given right arm weakness and instability with PT - hold any more sedating meds - re-orient by opening windows and lights off at night - control pain as best possible  Leg pain: Initial concerns for ischemia due to patient's history of claudication symptoms. ABI was normal and DP and PT were biphasic and is on rivaroxaban.  Given knee effusions today will evaluate knee. - XR knee - possible f/u with synovial fluid analysis - added ibuprofen for pain  Fall and dehydration: Bp was initially lower than baseline at ~80/50. Negative testing for orthostatic hypotension on 03/19/2016. AM cortisol 01/06 WNL for possible AI. 01/06 bp normalized at ~130/70. - PT / OT - Tylenol and voltargen gel PRN for pain  Elevated troponins:0.31-->0.23-->0.19. Likely demand ischemia/ afib precipitation.Pt has no chest pain. Myocardial perfusion testing revealed EF 37% with intermediate noninvasive risk stratification. - ASA 351m - Will follow cardiology recs - medical therapy currently  HFrEF: NM study 1/6 revealed EF 37% with septal dyskinesis. Strong family history of ischemic HF - on metoprolol XL 12.5 mg and consider ACE-I if bp improves - will follow cardiology recs  Afib: Pt is on Xarelto and new low dose metoprolol. Pacemaker interrogation showedhe hadbeen in AF since June.CHAD2VASC score of at least 4. S/p cardioversion 1/8.  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 U/s revealed benign appearing cystic mass in parotid gland.   GERD: - continue protonix   DM: Last A1c in March 2017 being 6.8. Pt on metformin at home. -SSI-S TID WC  HTN: BP non-hypertensive -metoprolol XL 12.5   AKI: Was resolvingwith IV fluids. Serum Cr 1.66 >> 1.06, however back  uptrending over the past few days without IVF to 1.87. However, cautious about IV fluids at this time given BUN less consistent with pre-renal AKI and his current +5L status during this hospitalization.   - trial 100 cc/hr NS - UA, urine urea nitrogen, urine Cr - will trend BMP  Hypomagnesemia: 1.3 1/5 and persisted low after magnesium oxide.  On 1/6 had an episode of polymorphic VT and was given IV Mg sulfate.  - restart Mg oxide BID -trend Mg  Hypophosphatemia: 2.3  on 1/6. - will not replete today due to elevated CK concerning for rhabdo    This is a Careers information officer Note.  The care of the patient was discussed with Dr. Wynetta Emery and the assessment and plan formulated with their assistance.  Please see their attached note for official documentation of the daily encounter.   LOS: 5 days   Darin Gonzalez, Medical Student 03/24/16, 8:55 AM

## 2016-03-27 DEATH — deceased

## 2016-04-24 ENCOUNTER — Encounter (HOSPITAL_COMMUNITY): Payer: Medicare HMO

## 2016-04-24 ENCOUNTER — Ambulatory Visit: Payer: Medicare HMO | Admitting: Vascular Surgery

## 2016-08-06 NOTE — Telephone Encounter (Signed)
error 

## 2017-12-16 IMAGING — CR DG CHEST 1V PORT
1 series · 1 of 1 positions shown · non-contrast
Comparison: 12/16/2008

CLINICAL DATA: Bradycardia and chest discomfort

EXAM:
PORTABLE CHEST 1 VIEW

[AP]
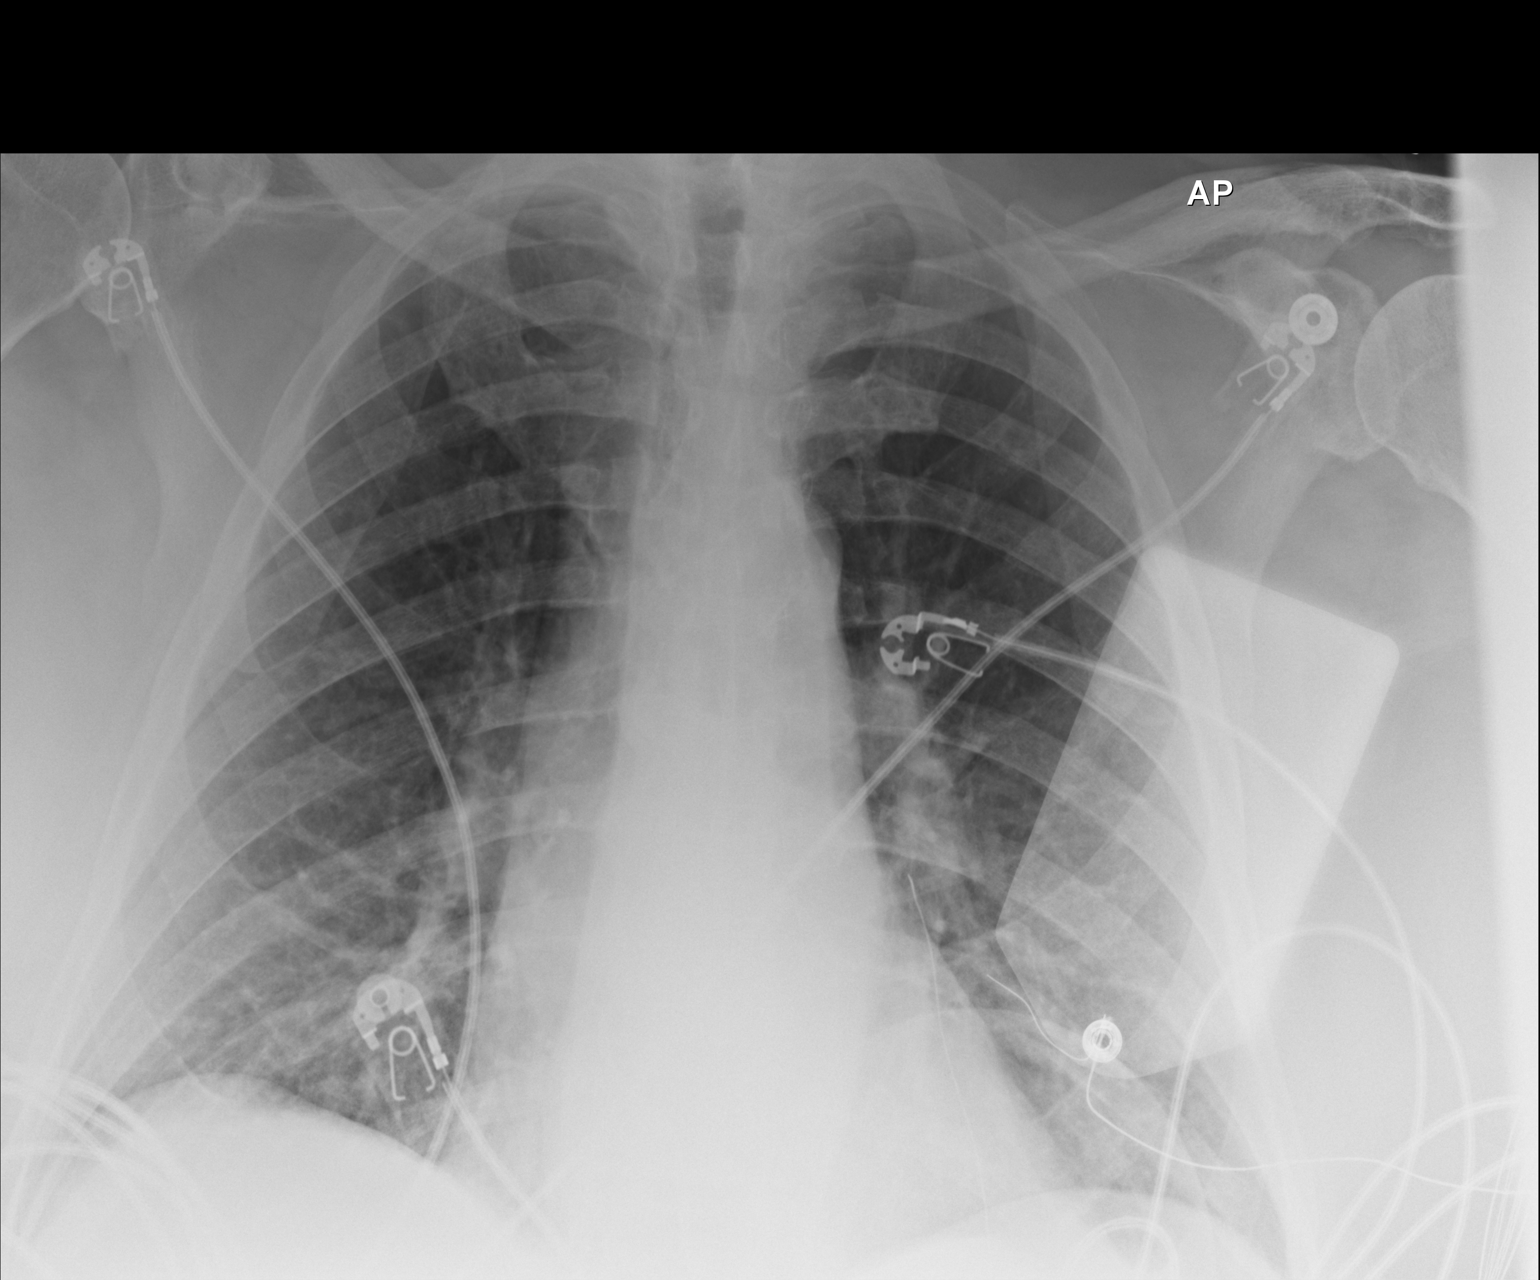

[1 of 1 positions shown; findings below may reference images not displayed]

FINDINGS: The heart size and mediastinal contours are within normal limits.
Both lungs are clear. The visualized skeletal structures are
unremarkable.
IMPRESSION: No active disease.

## 2017-12-17 IMAGING — DX DG CHEST 2V
2 series · 2 of 2 positions shown · non-contrast
Comparison: 04/30/2015.

CLINICAL DATA: Pacemaker.  Sore arm.

EXAM:
CHEST  2 VIEW

[w chest pa]
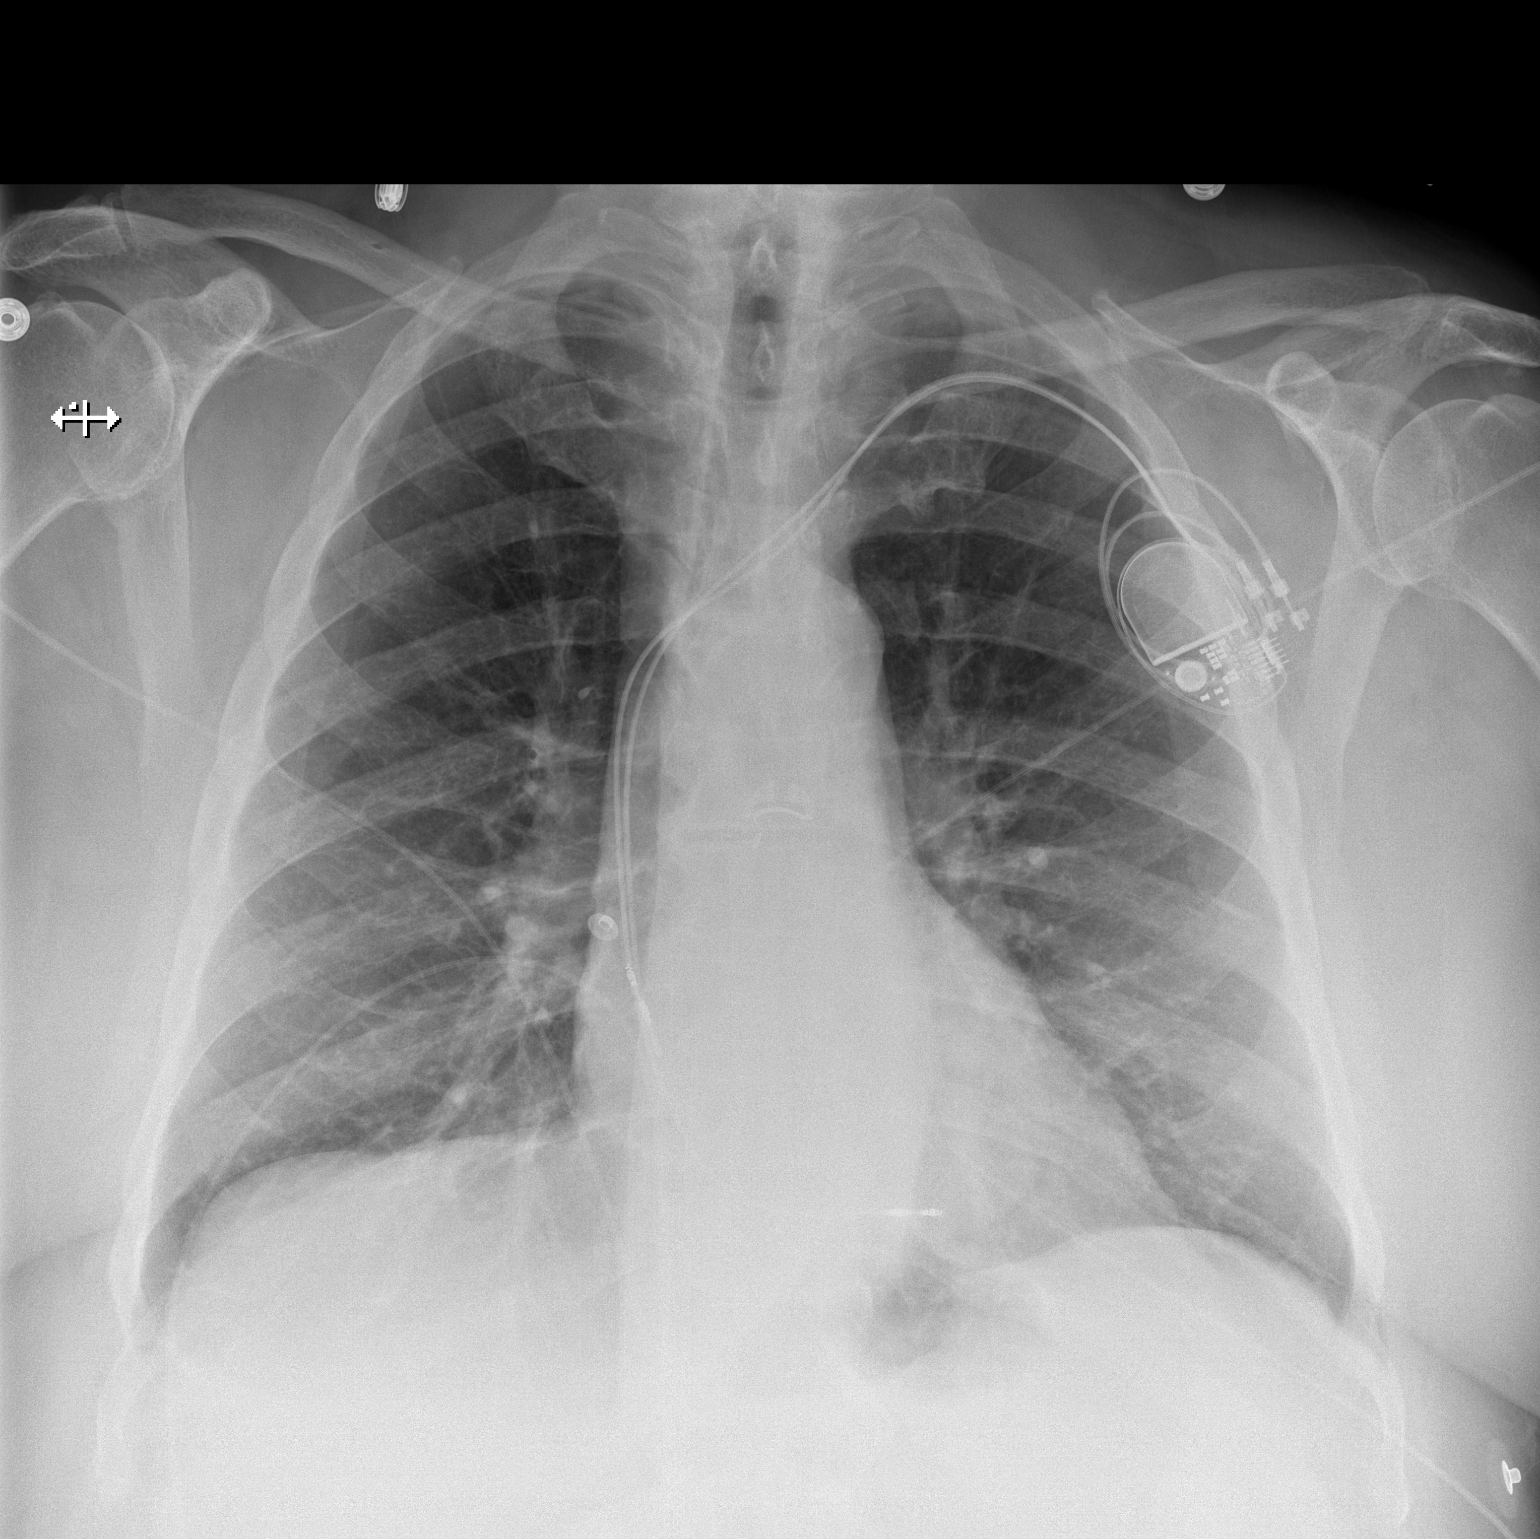

[w chest lat]
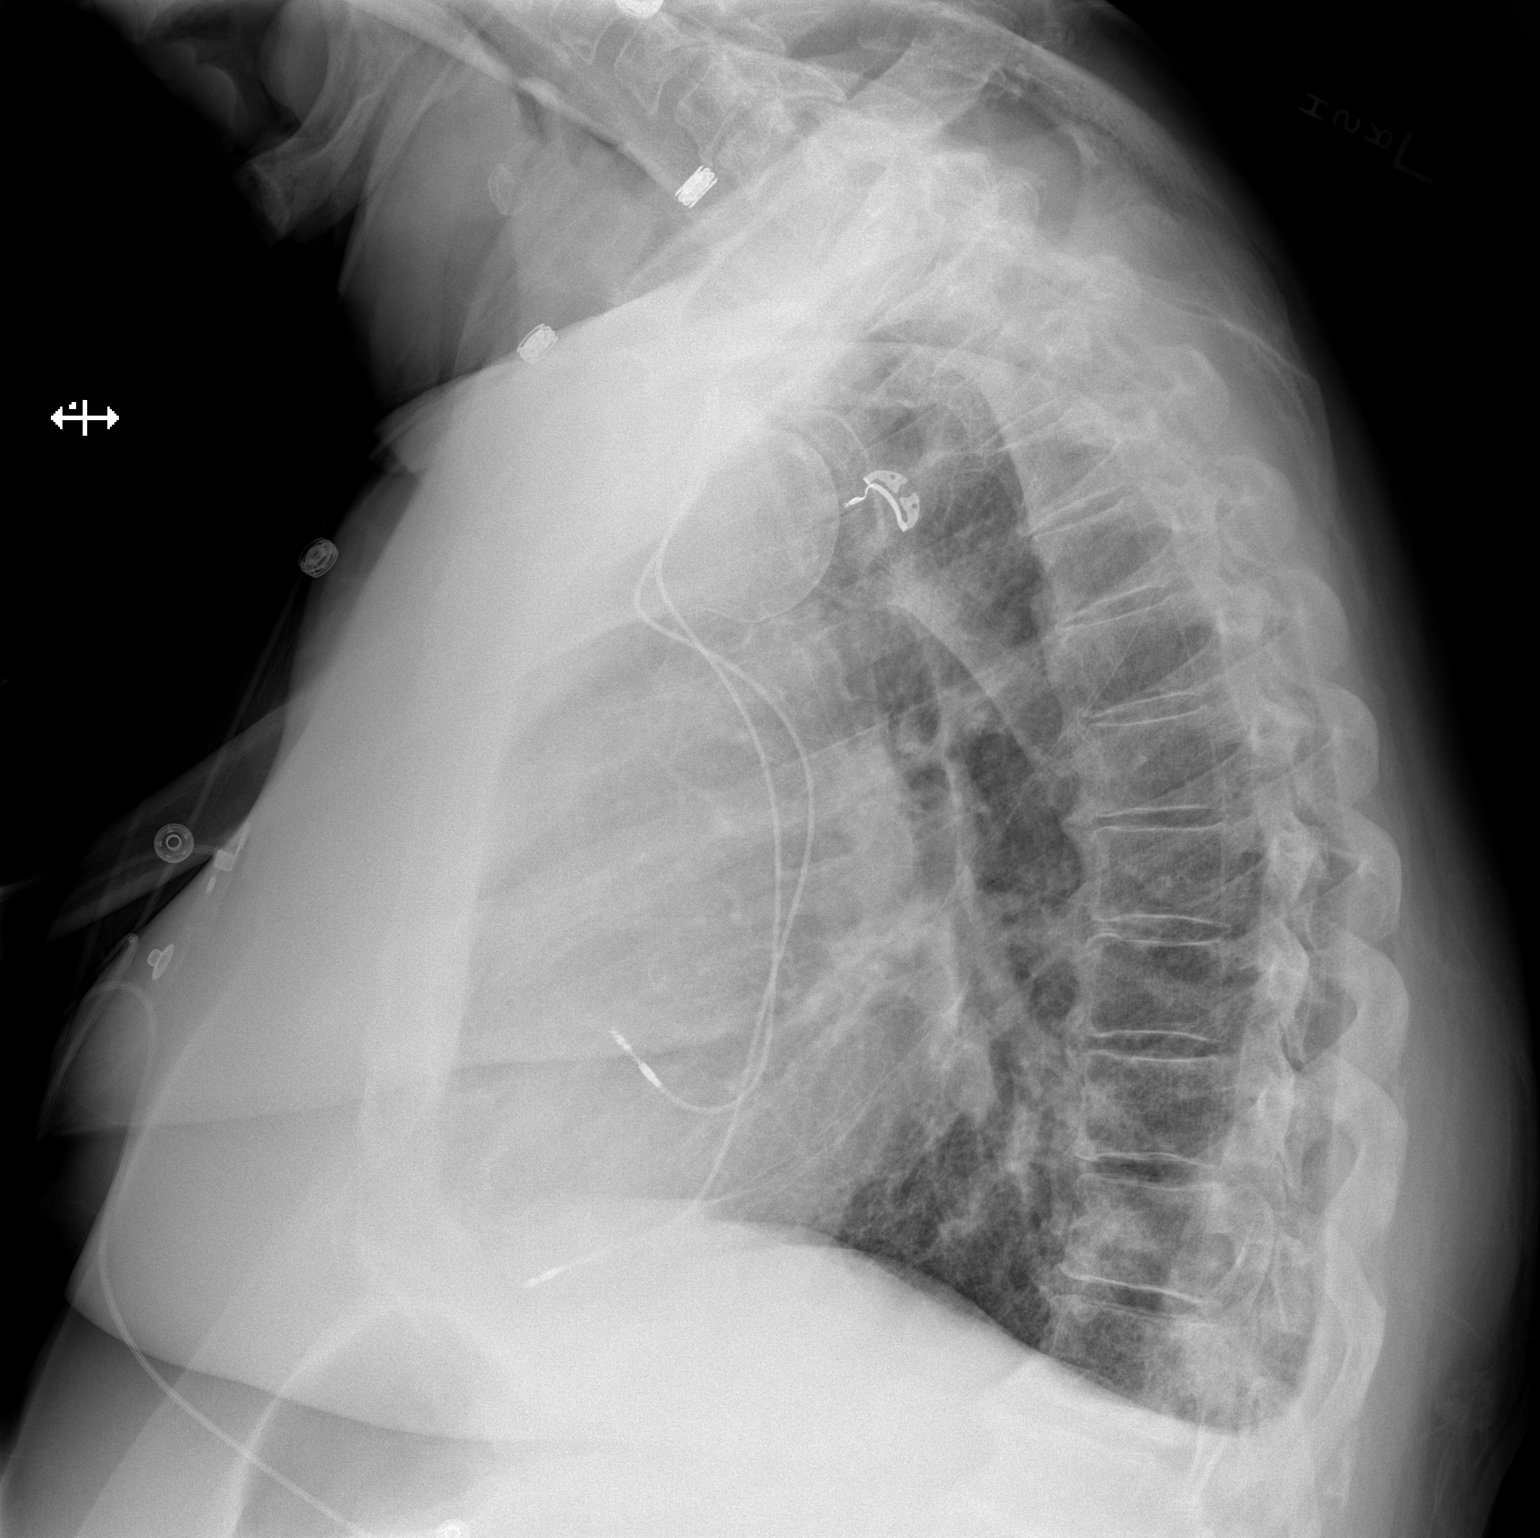

[2 of 2 positions shown; findings below may reference images not displayed]

FINDINGS: Cardiac pacer noted lead tips in right atrium and right ventricle.
Heart size normal. Low lung volumes with mild basilar atelectasis.
No pleural effusion or pneumothorax. No acute bony abnormality.
IMPRESSION: 1. Cardiac pacer with lead tips in right atrium right ventricle. No
complicating features. No pneumothorax.
2. Low lung volumes with mild bibasilar atelectasis.
